# Patient Record
Sex: Female | Born: 1995
Health system: Southern US, Community
[De-identification: ages and names within clinical notes are randomized; demographics above are authoritative.]

## PROBLEM LIST (undated history)

## (undated) DIAGNOSIS — M419 Scoliosis, unspecified: Secondary | ICD-10-CM

## (undated) DIAGNOSIS — G43109 Migraine with aura, not intractable, without status migrainosus: Secondary | ICD-10-CM

## (undated) DIAGNOSIS — O24419 Gestational diabetes mellitus in pregnancy, unspecified control: Secondary | ICD-10-CM

## (undated) DIAGNOSIS — B3731 Acute candidiasis of vulva and vagina: Secondary | ICD-10-CM

## (undated) DIAGNOSIS — O2686 Pruritic urticarial papules and plaques of pregnancy (PUPPP): Secondary | ICD-10-CM

## (undated) DIAGNOSIS — B373 Candidiasis of vulva and vagina: Secondary | ICD-10-CM

## (undated) HISTORY — DX: Gestational diabetes mellitus in pregnancy, unspecified control: O24.419

## (undated) HISTORY — PX: NO PAST SURGERIES: SHX2092

## (undated) HISTORY — DX: Migraine with aura, not intractable, without status migrainosus: G43.109

## (undated) HISTORY — DX: Pruritic urticarial papules and plaques of pregnancy (puppp): O26.86

## (undated) HISTORY — DX: Acute candidiasis of vulva and vagina: B37.31

## (undated) HISTORY — DX: Candidiasis of vulva and vagina: B37.3

---
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
## 2015-03-17 ENCOUNTER — Emergency Department (HOSPITAL_COMMUNITY)
Admission: EM | Admit: 2015-03-17 | Discharge: 2015-03-17 | Discharge status: Absent without leave | Payer: Medicaid Other | Source: Home / Self Care

## 2015-03-18 ENCOUNTER — Encounter (HOSPITAL_COMMUNITY): Payer: Self-pay | Admitting: Emergency Medicine

## 2015-03-18 ENCOUNTER — Emergency Department (HOSPITAL_COMMUNITY)
Admission: EM | Admit: 2015-03-18 | Discharge: 2015-03-18 | Disposition: A | Discharge status: Home / Self Care | Payer: Medicaid Other | Attending: Emergency Medicine | Admitting: Emergency Medicine

## 2015-03-18 DIAGNOSIS — M545 Low back pain: Secondary | ICD-10-CM | POA: Insufficient documentation

## 2015-03-18 DIAGNOSIS — N898 Other specified noninflammatory disorders of vagina: Secondary | ICD-10-CM

## 2015-03-18 DIAGNOSIS — J069 Acute upper respiratory infection, unspecified: Secondary | ICD-10-CM

## 2015-03-18 DIAGNOSIS — M419 Scoliosis, unspecified: Secondary | ICD-10-CM | POA: Insufficient documentation

## 2015-03-18 DIAGNOSIS — Z3202 Encounter for pregnancy test, result negative: Secondary | ICD-10-CM | POA: Insufficient documentation

## 2015-03-18 HISTORY — DX: Scoliosis, unspecified: M41.9

## 2015-03-18 LAB — WET PREP, GENITAL
Clue Cells Wet Prep HPF POC: NONE SEEN
Sperm: NONE SEEN
Trich, Wet Prep: NONE SEEN
YEAST WET PREP: NONE SEEN

## 2015-03-18 LAB — I-STAT BETA HCG BLOOD, ED (MC, WL, AP ONLY): I-stat hCG, quantitative: <5 m[IU]/mL (ref <5)

## 2015-03-18 MED ORDER — ONDANSETRON 4 MG PO TBDP
4.0000 mg | ORAL_TABLET | Freq: Once | ORAL | Status: AC
  Administered 2015-03-18: 4 mg via ORAL
  Filled 2015-03-18: qty 1

## 2015-03-18 MED ORDER — REPHRESH VA GEL: 1.0000 | Freq: Every day | VAGINAL | Status: DC | PRN

## 2015-03-18 MED ORDER — ONDANSETRON HCL 4 MG PO TABS
4.0000 mg | ORAL_TABLET | Freq: Three times a day (TID) | ORAL | Status: DC | PRN
Start: 2015-03-18 — End: 2015-12-28

## 2015-03-18 MED ORDER — IBUPROFEN 600 MG PO TABS: 600.0000 mg | ORAL_TABLET | Freq: Three times a day (TID) | ORAL | Status: DC | PRN

## 2015-03-18 MED ORDER — IBUPROFEN 200 MG PO TABS
600.0000 mg | ORAL_TABLET | Freq: Once | ORAL | Status: AC
  Administered 2015-03-18: 600 mg via ORAL
  Filled 2015-03-18: qty 3

## 2015-03-18 NOTE — ED Provider Notes (Signed)
CSN: [id number]     Arrival date & time 03/18/15  1548 History   First MD Initiated Contact with Patient 03/18/15 1600     Chief Complaint  Patient presents with  . Sore Throat  . Cough  . Back Pain  . Vaginal Discharge     (Consider location/radiation/quality/duration/timing/severity/associated sxs/prior Treatment) The history is provided by the patient.     Patient with hx scoliosis presents with two days of sore throat, cough productive of yellow sputum, headache, nausea.  Also with low back pain that feels like her scoliosis.  Has had sick contact with niece last week who had sinusitis.  Also C/O many months of abnormal malodorous vaginal discharge.  LMP Nov 20.  Denies fevers, chills, myalgias, urinary symptoms, abdominal pain, bowel changes, weakness or numbness of the legs.   Was seen in urgent care yesterday and diagnosed with flu like symptoms, came today because she is concerned about the symptoms because she hasn't gotten a flu shot in several years.  Took nyquil x 1 dose two nights ago.    Past Medical History  Diagnosis Date  . Scoliosis    History reviewed. No pertinent past surgical history. History reviewed. No pertinent family history. Social History  Substance Use Topics  . Smoking status: Never Smoker   . Smokeless tobacco: None  . Alcohol Use: No   OB History    No data available     Review of Systems  All other systems reviewed and are negative.     Allergies  Review of patient's allergies indicates no known allergies.  Home Medications   Prior to Admission medications   Medication Sig Start Date End Date Taking? Authorizing Provider  Pseudoeph-Doxylamine-DM-APAP (NYQUIL PO) Take 30 mLs by mouth daily as needed (cold symptoms).   Yes Historical Provider, MD  rizatriptan (MAXALT-MLT) 5 MG disintegrating tablet Take 1 tablet (5 mg total) by mouth as needed for migraine. May repeat in 2 hours if needed Patient not taking: Reported on 03/18/2015  01/06/13   Reuben Likes, MD  triamcinolone cream (KENALOG) 0.1 % Apply 1 application topically 2 (two) times daily as needed. For itching 09/02/14   Charm Rings, MD   BP 135/91 mmHg  Pulse 79  Temp(Src) 98.6 F (37 C) (Oral)  Resp 20  SpO2 97% Physical Exam  Constitutional: She appears well-developed and well-nourished. No distress.  HENT:  Head: Normocephalic and atraumatic.  Mouth/Throat: Oropharynx is clear and moist. No oropharyngeal exudate.  Eyes: Conjunctivae are normal.  Neck: Neck supple.  Cardiovascular: Normal rate and regular rhythm.   Pulmonary/Chest: Effort normal and breath sounds normal. No respiratory distress. She has no wheezes. She has no rales.  Abdominal: Soft. She exhibits no distension. There is no tenderness. There is no rebound and no guarding.  Genitourinary: Uterus is not tender. Cervix exhibits no motion tenderness and no discharge. Right adnexum displays no mass, no tenderness and no fullness. Left adnexum displays no mass, no tenderness and no fullness. No erythema, tenderness or bleeding in the vagina. No foreign body around the vagina. No signs of injury around the vagina.  Very small amount of clear and white mucous in vagina.  Neurological: She is alert.  Skin: She is not diaphoretic.  Nursing note and vitals reviewed.   ED Course  Procedures (including critical care time) Labs Review Labs Reviewed  WET PREP, GENITAL - Abnormal; Notable for the following:    WBC, Wet Prep HPF POC MANY (*)  All other components within normal limits  HIV ANTIBODY (ROUTINE TESTING)  I-STAT BETA HCG BLOOD, ED ([facility], [facility], [facility] ONLY)  GC/CHLAMYDIA PROBE AMP ([place]) NOT AT [facility]    Imaging Review No results found. I have personally reviewed and evaluated these images and lab results as part of my medical decision-making.   EKG Interpretation None       Pt felt much better after ibuprofen and zofran. States she had never tried ibuprofen before.   MDM    Final diagnoses:  URI (upper respiratory infection)  Vaginal discharge    Afebrile, nontoxic patient with constellation of symptoms suggestive of viral syndrome.  No concerning findings on exam. Also c/o vaginal discharge.  Pelvic exam unremarkable. Wet prep many WBC.  Discharged home with supportive care, PCP follow up.  Resources.  Discussed result, findings, treatment, and follow up  with patient.  Pt given return precautions.  Pt verbalizes understanding and agrees with plan.        Trixie Dredgemily Tamanna Whitson, PA-C 03/18/15 1919  Leta BaptistEmily Roe Nguyen, MD 03/19/15 1038

## 2015-03-18 NOTE — Discharge Instructions (Signed)
Read the information below.  Use the prescribed medication as directed.  Please discuss all new medications with your pharmacist.  You may return to the Emergency Department at any time for worsening condition or any new symptoms that concern you.    If you develop high fevers that do not resolve with tylenol or ibuprofen, you have difficulty swallowing or breathing, or you are unable to tolerate fluids by mouth, return to the ER for a recheck.      Nhi?m trng ???ng h h?p trn, Ng??i l?n (Upper Respiratory Infection, Adult) H?u h?t nhi?m trng ???ng h h?p trn (URI) l b?nh nhi?m do vi rt ? ???ng d?n kh ??n ph?i. URI ?nh h??ng ??n m?i, h?ng v ???ng d?n kh trn. Lo?i URI ph? bi?n nh?t l vim m?i h?ng v th??ng ???c cho l "c?m l?nh thng th??ng". URI ti?n tri?n v th??ng s? t? kh?i. Thng th??ng URI khng c?n ?i?u tr?, nh?ng ?i khi ti?p theo nhi?m vi rt l nhi?m khu?n ???ng h h?p trn. Tnh tr?ng ny g?i l nhi?m trng th? pht. Nhi?m trng xoang v tai gi?a l nh?ng lo?i nhi?m trng h h?p trn th? pht ph? bi?n nh?t. Vim ph?i do vi khu?n c?ng c th? lm cho URI tr?m tr?ng h?n. URI c th? lm cho b?nh hen suy?n v b?nh ph?i t?c ngh?n m?n tnh (COPD) tr?m tr?ng h?n. ?i khi, nh?ng bi?n ch?ng ny c th? c?n ph?i ???c ?i?u tr? c?p c?u n?i khoa v c th? ?e d?a tnh m?ng.  NGUYN NHN H?u h?t t?t c? cc tr??ng h?p URI ??u do vi rt gy ra. Vi rt l m?t lo?i m?m b?nh v c th? ly lan t? ng??i ny sang ng??i khc.  CC Y?U T? NGUY C? Qu v? c th? c nguy c? b? URI n?u:   Qu v? ht thu?c l.  Qu v? b? b?nh tim ho?c b?nh ph?i m?n tnh.  Qu v? c h? th?ng phng v? (mi?n d?ch) suy y?u.  Qu v? r?t tr? ho?c r?t gi.  Qu v? b? d? ?ng ? m?i ho?c b? hen suy?n.  Qu v? lm vi?c trong cc khu v?c ?ng ?c ho?c thng kh km.  Qu v? lm vi?c trong cc trung tm ch?m [place] s?c kh?e ho?c tr??ng h?c. D?U HI?U V TRI?U CH?NG  Cc tri?u ch?ng th??ng pht tri?n sau khi qu v? ti?p xc v?i vi  rt c?m l?nh 2-3 ngy. H?u h?t cc tr??ng h?p URI do vi rt ko di 7-10 ngy. Tuy nhin, cc tr??ng h?p URI do vi rt cm (vi rt cm) c th? ko di 14 - 18 ngy v th??ng n?ng h?n. Tri?u ch?ng c th? bao g?m:   Ch?y n??c m?i ho?c ng?t (ngh?t) m?i.  H?t h?i.  Ho.  ?au h?ng.  ?au ??u.  M?t m?i.  S?t.  ?n khng ngon mi?ng.  ?au ? trn, pha sau m?t v trn x??ng g m (?au xoang).  ?au nh?c c?. CH?N ?ON  Chuyn gia ch?m  s?c kh?e c th? ch?n ?on URI b?ng cch:  Khm th?c th?.  Ki?m tra ?? kh?ng ??nh cc tri?u ch?ng khng ph?i l do m?t tnh tr?ng khc, ch?ng h?n nh?:  Vim h?ng do lin c?u khu?n.  Vim xoang.  Vim ph?i.  Hen suy?n. ?I?U TR?  URI t? kh?i sau m?t th?i gian. B?nh khng th? ch?a kh?i ???c b?ng thu?c, nh?ng thu?c c th? ???c k ??n v khuy?n ngh? dng ?? lm gi?m cc  tri?u ch?ng. Thu?c c th? gip:  H? s?t.  Gi?m ho.  Gi?m ngh?t m?i. H??NG D?N CH?M [place] T?I NH   Ch? s? d?ng thu?c theo ch? d?n c?a chuyn gia ch?m [place] s?c kh?e.  Xc mi?ng b?ng n??c mu?i ?m ho?c dng thu?c gi?m ho ?? lm d?u h?ng qu v? theo ch? d?n c?a chuyn gia ch?m [place] s?c kh?e.  S? d?ng my t?o ?? ?m b?ng s??ng m ?m ho?c ht h?i n??c t? m?t vi sen ?? t?ng ?? ?m khng kh. ?i?u ny c th? gip d? th? h?n.  U?ng ?? n??c ?? gi? cho n??c ti?u trong ho?c c mu vng nh?t.  ?n sp ho?c cc lo?i n??c canh trong khc v duy tr ch? ?? dinh d??ng t?t.  Ngh? ng?i khi c?n.  Tr? l?i lm vi?c khi nhi?t ?? c?a qu v? ? tr? l?i bnh th??ng ho?c theo chuyn gia ch?m [place] s?c kh?e h??ng d?n. Qu v? c th? c?n ? nh lu h?n ?? trnh ly nhi?m cho ng??i khc. Qu v? c?ng c th? s? d?ng m?t n? v r?a tay c?n th?n ?? ng?n ng?a ly lan vi rt.  T?ng c??ng s? d?ng ?ng thu?c ht n?u qu v? b? b?nh hen suy?n.  Khng s? d?ng b?t c? cc s?n ph?m thu?c l no, bao g?m thu?c l d?ng ht, thu?c l d?ng nhai ho?c thu?c l ?i?n t?. N?u qu v? c?n gip ?? ?? cai thu?c, hy h?i chuyn gia ch?m [place] s?c  kh?e. PHNG NG?A  Cch t?t nh?t ?? b?o v? b?n thn kh?i b? c?m l?nh l th?c hnh v? sinh t?t.   Hessie Diener ti?p xc b?ng mi?ng ho?c b?ng tay v?i nh?ng ng??i c tri?u ch?ng c?m l?nh.  R?a tay th??ng xuyn n?u c ti?p xc. Khng c b?ng ch?ng r rng v? vi?c vitamin C, vitamin E, echinacea ho?c t?p th? d?c lm gi?m kh? n?ng b? c?m l?nh. Tuy nhin, qu v? lun c?n ngh? ng?i nhi?u, t?p th? d?c v c ch? ?? dinh d??ng t?t.  ?I KHM N?U:   Qu v? b? tr?m tr?ng h?n ch? khng ?? h?n.  Tri?u ch?ng c?a qu v? khng ki?m sot ???c b?ng thu?c.  Qu v? b? ?n l?nh.  Qu v? b? kh th? h?n.  Qu v? c ??m mu nu ho?c ??Sander Nephew v? ti?t d?ch mu vng ho?c mu nu ? m?i.  Qu v? b? ?au ? m?t, ??c bi?t l khi qu v? ci v? pha tr??c.  Qu v? b? s?t.  Qu v? b? s?ng h?ch c?.  Qu v? th?y ?au khi nu?t.  Qu v? c nh?ng vng mu tr?ng ? thnh sau h?ng. NGAY L?P T?C ?I KHM N?U:   Qu v? b? n?ng v lin t?c:  ?au ??u.  ?au tai.  ?au xoang.  ?au ng?c.  Qu v? b? b?nh ph?i m?n tnh v b?t k? tnh tr?ng no sau ?y:  Th? kh kh.  Ho ko di.  Ho ra mu.  Thay ??i s? l??ng v mu s?c ??m thng th??ng c?a qu v?.  Qu v? b? c?ng c?.  Qu v? c nh?ng thay ??i v?:  Th? l?c.  Thnh l?c.  Suy ngh?.  Tm tnh. ??M B?O QU V?:   Hi?u r cc h??ng d?n ny.  S? theo di tnh tr?ng c?a mnh.  S? yu c?u tr? gip ngay l?p t?c n?u qu v? c?m th?y khng kh?e ho?c th?y tr?m tr?ng h?n.   Thng  tin ny khng nh?m m?c ?ch thay th? cho l?i khuyn m chuyn gia ch?m [place] s?c kh?e ni v?i qu v?. Hy b?o ??m qu v? ph?i th?o lu?n b?t k? v?n ?? g m qu v? c v?i chuyn gia ch?m [place] s?c kh?e c?a qu v?.   Document Released: 10/16/2010 Document Revised: 08/17/2014 Elsevier Interactive Patient Education [employer].

## 2015-03-18 NOTE — ED Notes (Signed)
Patient states she has had cough, sore throat and low back pain for several days.  Patient also c/o white pruritic malodorous vaginal discharge x3 days.  Patient denies N/V/D and fever.  Patient denies urinary s/s such as dysuria, hematuria and urinary frequency/urgency.

## 2015-03-18 NOTE — Progress Notes (Addendum)
Pt informs CM she does not have a pcp and has family planning medicaid  Self pay is indicated by registration  CM spoke with pt who [place].  CM discussed and provided written information for uninsured accepting pcps, discussed the importance of pcp vs EDP services for f/u care, www.needymeds.org, www.goodrx.com, discounted pharmacies and other [place] resources such as [facility] , [facility], affordable care act, financial assistance, uninsured dental services, [place] med assist, DSS and  health department  Reviewed resources for [place] uninsured accepting pcps like [facility], [facility] medicine [facility] [address], [facility], [facility], local urgent care centers, [facility], [facility], general medical clinics, [facility] the [place], [facility] care plus others, medication resources, [facility] out patient pharmacies and housing Pt voiced understanding and appreciation of resources provided   Provided [facility] contact information Pt agreed to a referral Cm completed referral Pt to be contact by [facility] clinical liason  Provided a list of medicaid [place] providers and a [employer] fact sheet and brochure of what family planning medicaid covers and does not cover Not covered Abortions  Ambulance  Condoms  Contraceptive foam, jellies or suppositories  Dental  Durable medical equipment  Emergency department  Emergency room  Fertility testing and treatment  Home health  Hysterectomies Inpatient hospital  Mental health  Optical  Pregnancy health care (beneficiaries should seek referral to the Medicaid for Pregnant Women Program for pregnancy health care)  Sick visits  Treatment for AIDS  Treatment for cancer  Any service not related to family planning

## 2015-03-18 NOTE — Progress Notes (Signed)
Pharmacy tech visiting pt when CM went to visit

## 2015-03-18 NOTE — ED Notes (Addendum)
Pt reports a sore throat that began a few days ago that progressed into a productive cough, HA and nausea (no emesis). Pt went to UC yesterday and was diagnosed with flu-like symptoms, but wants to be seen again. Pt also reports her lower back hurts, hx of scoliosis, thinks it is getting worse.

## 2015-03-18 NOTE — Progress Notes (Signed)
Entered in D/c instructions Medicaid family planning coverage & List of [place] Medicaid doctors  [facility] [phone] [place] [facility]: [phone] [address] [place], [place] [id number] [email or web address] This website describes your coverage+ other resources  Please review the educational information -brochure, fact sheet for what is covered for family planning how to inquire about other services that may be needed call your case worker at DSS  There are drs that may be able to assist @[place]  [facility] Please use the resources provided to you in emergency room by case manager to assist with doctor for follow up  Call on 03/21/2015 A referral for you has been sent to [facility] if you have not received a call in 3 days you may contact them Call Scherry RanKaren Andrianos at [phone] Tuesday-Friday [email or web address]  These [place] uninsured resources provide possible primary care providers, resources for discounted medications, housing, dental resources, affordable care act information, plus other resources for [employer] [place] medicaid beneficaries coverage  As a Medicaid client you MUST contact DSS/SSI each time you change address, move to another [place] or another state to keep your address updated

## 2015-03-19 LAB — HIV ANTIBODY (ROUTINE TESTING W REFLEX): HIV Screen 4th Generation wRfx: NONREACTIVE

## 2015-03-21 LAB — GC/CHLAMYDIA PROBE AMP (~~LOC~~) NOT AT ARMC
CHLAMYDIA, DNA PROBE: NEGATIVE
NEISSERIA GONORRHEA: NEGATIVE

## 2015-05-18 ENCOUNTER — Emergency Department (HOSPITAL_COMMUNITY)
Admission: EM | Admit: 2015-05-18 | Discharge: 2015-05-18 | Disposition: A | Discharge status: Home / Self Care | Payer: BLUE CROSS/BLUE SHIELD | Source: Home / Self Care | Attending: Family Medicine | Admitting: Family Medicine

## 2015-05-18 ENCOUNTER — Encounter (HOSPITAL_COMMUNITY): Payer: Self-pay

## 2015-05-18 DIAGNOSIS — B349 Viral infection, unspecified: Secondary | ICD-10-CM

## 2015-05-18 LAB — POCT URINALYSIS DIP (DEVICE)
Bilirubin Urine: NEGATIVE
Glucose, UA: NEGATIVE mg/dL
HGB URINE DIPSTICK: NEGATIVE
KETONES UR: NEGATIVE mg/dL
Leukocytes, UA: NEGATIVE
Nitrite: NEGATIVE
PROTEIN: NEGATIVE mg/dL
UROBILINOGEN UA: 1 mg/dL (ref 0.0–1.0)
pH: 6.5 (ref 5.0–8.0)

## 2015-05-18 NOTE — ED Notes (Signed)
Patient complains of cough congestion chills body aches Patient states her symptoms started last night

## 2015-05-18 NOTE — Discharge Instructions (Signed)
Nhi?m Trng Do Vi-Rt (Viral Infections) Nhi?m trng do vi rt c th? gy ra b?i cc lo?i vi rt khc nhau. H?u h?t nhi?m trng vi rt khng nghim tr?ng v t? kh?i. Tuy nhin, m?t s? nhi?m trng c th? gy ra cc tri?u ch?ng nghim tr?ng v c th? d?n ??n cc bi?n ch?ng khc. TRI?U CH?NG Vi rt c th? th??ng xuyn gy ra:  ?au h?ng nh?.  ?au nh?c.  ?au ??u.  S? m?i.  Cc lo?i pht ban khc nhau.  Ch?y n??c m?t.  M?t m?i.  Ho.  ?n khng ngon mi?ng.  Nhi?m trng ???ng tiu ha gy bu?n nn, nn m?a v tiu ch?y. Nh?ng tri?u ch?ng ny khng ph?n ?ng v?i thu?c khng sinh, v nhi?m trng khng gy ra b?i vi khu?n. Tuy nhin, b?n c th? b? nhi?m trng do vi khu?n sau khi nhi?m vi rt. ?y ?i khi ???c g?i l "b?i nhi?m". Cc tri?u ch?ng c?a nhi?m trng do vi khu?n nh? v?y c th? bao g?m:  ?au h?ng c m? v kh nu?t ngy cng t?i t?.  S?ng cc h?ch ? c?.  ?n l?nh v s?t cao ho?c ko di.  ?au ??u d? d?i.  C?m gic ?au ? vng xoang.  C?m gic b? m?t ton thn (kh ch?u) ko di, ?au nh?c c? b?p v m?t m?i.  Lin t?c ho.  Ho ra b?t k? ??m mu vng, xanh ho?c nu. H??NG D?N CH?M SC T?I NH  Ch? s? d?ng thu?c khng c?n k toa ho?c thu?c c?n k toa ?? gi?m ?au, gi?m c?m gic kh ch?u, tiu ch?y ho?c h? s?t theo ch? d?n c?a chuyn gia ch?m sc s?c kh?e.  U?ng ?? n??c v dung d?ch ?? n??c ti?u trong ho?c c mu vng nh?t. ?? u?ng th? thao c th? cung c?p ch?t ?i?n gi?i, ???ng v ?? n??c c gi tr?.  Ngh? ng?i nhi?u v duy tr ch? ?? dinh d??ng thch h?p. C th? dng sp v n??c canh v?i bnh quy gin ho?c g?o. HY NGAY L?P T?C ?I KHM N?U:  B?n b? nh?c ??u, kh th?, ?au ng?c, ?au c? n?ng ho?c pht ban khc th??ng.  B?n b? nn, tiu ch?y khng ki?m sot ???c, ho?c b?n khng th? gi? l?i ch?t l?ng.  Nhi?t ?? ?o ? mi?ng trn 38,9 C (102 F), khng gi?m sau khi dng thu?c.  Tr? h?n 3 thng tu?i c nhi?t ?? ?o ? tr?c trng l 102 F (38,9 C) ho?c cao h?n.  Tr? 3 thng tu?i  ho?c nh? h?n c nhi?t ?? ?o ? tr?c trng l 100,4 F (38 C) ho?c cao h?n. HY CH?C CH?N R?NG B?N:  Hi?u r nh?ng h??ng d?n khi xu?t vi?n.  S? theo di tnh tr?ng b?nh c?a b?n.  S? yu c?u tr? gip ngay l?p t?c n?u b?n khng ?? ho?c tnh tr?ng tr?m tr?ng h?n.   Thng tin ny khng nh?m m?c ?ch thay th? cho l?i khuyn m chuyn gia ch?m sc s?c kh?e ni v?i qu v?. Hy b?o ??m qu v? ph?i th?o lu?n b?t k? v?n ?? g m qu v? c v?i chuyn gia ch?m sc s?c kh?e c?a qu v?.   Document Released: 04/02/2005 Document Revised: 12/03/2012 Elsevier Interactive Patient Education 2016 Elsevier Inc.  

## 2015-05-18 NOTE — ED Provider Notes (Signed)
CSN: [id number]     Arrival date & time 05/18/15  1750 History   First MD Initiated Contact with Patient 05/18/15 1929     Chief Complaint  Patient presents with  . Generalized Body Aches   (Consider location/radiation/quality/duration/timing/severity/associated sxs/prior Treatment) HPI History obtained from patient:   LOCATION: Upper resp SEVERITY: 2 DURATION:a couple of days CONTEXT:sudden onset QUALITY: MODIFYING FACTORS:OTC meds ASSOCIATED SYMPTOMS:body aches, chills TIMING:constant OCCUPATION:student  Past Medical History  Diagnosis Date  . Scoliosis    History reviewed. No pertinent past surgical history. No family history on file. Social History  Substance Use Topics  . Smoking status: Never Smoker   . Smokeless tobacco: None  . Alcohol Use: No   OB History    No data available     Review of Systems ROS +'veback pain, uri symptoms  Denies: HEADACHE, NAUSEA, ABDOMINAL PAIN, CHEST PAIN, CONGESTION, DYSURIA, SHORTNESS OF BREATH  Allergies  Review of patient's allergies indicates no known allergies.  Home Medications   Prior to Admission medications   Medication Sig Start Date End Date Taking? Authorizing Provider  ibuprofen (ADVIL,MOTRIN) 600 MG tablet Take 1 tablet (600 mg total) by mouth every 8 (eight) hours as needed for mild pain or moderate pain. 03/18/15   Trixie Dredge, PA-C  ondansetron (ZOFRAN) 4 MG tablet Take 1 tablet (4 mg total) by mouth every 8 (eight) hours as needed for nausea or vomiting. 03/18/15   Trixie Dredge, PA-C  Pseudoeph-Doxylamine-DM-APAP (NYQUIL PO) Take 30 mLs by mouth daily as needed (cold symptoms).    Historical Provider, MD  rizatriptan (MAXALT-MLT) 5 MG disintegrating tablet Take 1 tablet (5 mg total) by mouth as needed for migraine. May repeat in 2 hours if needed Patient not taking: Reported on 03/18/2015 01/06/13   Reuben Likes, MD  triamcinolone cream (KENALOG) 0.1 % Apply 1 application topically 2 (two) times daily as needed.  For itching 09/02/14   Charm Rings, MD  Vaginal Lubricant (REPHRESH) GEL Place 1 Applicatorful vaginally daily as needed (vaginal discharge). 03/18/15   Trixie Dredge, PA-C   Meds Ordered and Administered this Visit  Medications - No data to display  BP 111/76 mmHg  Pulse 98  Temp(Src) 99.5 F (37.5 C) (Oral)  Resp 16  SpO2 98% No data found.   Physical Exam  Constitutional: She is oriented to person, place, and time. She appears well-developed and well-nourished.  HENT:  Head: Normocephalic and atraumatic.  Right Ear: External ear normal.  Left Ear: External ear normal.  Nose: Nose normal.  Mouth/Throat: Oropharynx is clear and moist.  Eyes: Conjunctivae are normal.  Neck: Normal range of motion. Neck supple.  Cardiovascular: Normal rate.   Pulmonary/Chest: Effort normal and breath sounds normal.  Abdominal: Soft.  Musculoskeletal: Normal range of motion.  Neurological: She is alert and oriented to person, place, and time.  Skin: Skin is warm and dry.  Psychiatric: She has a normal mood and affect. Her behavior is normal.  Nursing note and vitals reviewed.   ED Course  Procedures (including critical care time)  Labs Review Labs Reviewed  POCT URINALYSIS DIP (DEVICE)    Imaging Review No results found.   Visual Acuity Review  Right Eye Distance:   Left Eye Distance:   Bilateral Distance:    Right Eye Near:   Left Eye Near:    Bilateral Near:         MDM   1. Viral illness    Patient is advised to continue home symptomatic  treatment.  Patient is advised that if there are new or worsening symptoms or attend the emergency department, or contact primary care provider. Instructions of care provided discharged home in stable condition. Return to work/school note provided.  THIS NOTE WAS GENERATED USING A VOICE RECOGNITION SOFTWARE PROGRAM. ALL REASONABLE EFFORTS  WERE MADE TO PROOFREAD THIS DOCUMENT FOR ACCURACY.     Tharon Aquas, PA 05/18/15  2019

## 2015-12-28 ENCOUNTER — Encounter: Payer: Self-pay | Admitting: Family

## 2015-12-28 ENCOUNTER — Ambulatory Visit (INDEPENDENT_AMBULATORY_CARE_PROVIDER_SITE_OTHER): Payer: BLUE CROSS/BLUE SHIELD | Admitting: Family

## 2015-12-28 VITALS — BP 137/88 | HR 87 | Ht 61.81 in | Wt 108.8 lb

## 2015-12-28 DIAGNOSIS — Z3202 Encounter for pregnancy test, result negative: Secondary | ICD-10-CM

## 2015-12-28 DIAGNOSIS — Z30011 Encounter for initial prescription of contraceptive pills: Secondary | ICD-10-CM | POA: Diagnosis not present

## 2015-12-28 DIAGNOSIS — Z113 Encounter for screening for infections with a predominantly sexual mode of transmission: Secondary | ICD-10-CM

## 2015-12-28 DIAGNOSIS — G43109 Migraine with aura, not intractable, without status migrainosus: Secondary | ICD-10-CM | POA: Diagnosis not present

## 2015-12-28 DIAGNOSIS — N898 Other specified noninflammatory disorders of vagina: Secondary | ICD-10-CM

## 2015-12-28 LAB — POCT URINE PREGNANCY: PREG TEST UR: NEGATIVE

## 2015-12-28 LAB — POCT RAPID HIV: RAPID HIV, POC: NEGATIVE

## 2015-12-28 MED ORDER — NORETHINDRONE 0.35 MG PO TABS: 1.0000 | ORAL_TABLET | Freq: Every day | ORAL | 4 refills | Status: DC

## 2015-12-28 NOTE — Progress Notes (Signed)
THIS RECORD MAY CONTAIN CONFIDENTIAL INFORMATION THAT SHOULD NOT BE RELEASED WITHOUT REVIEW OF THE SERVICE PROVIDER.  Adolescent Medicine New Consult Visit:  Terri Combs  is a 20 y.o. female with a history of migraine with aura who is here for contraception management.   Clinical Staff Visit Tasks:   - Urine GC/CT due? yes - HIV Screening due?  yes   Growth Chart Viewed? yes   History was provided by the patient.  PCP Confirmed?  No - says she does not have one  My Chart Activated?   no  Patient's personal or confidential phone number:  [phone] Enter confidential phone number in Family Comments section of SnapShot  CC: discuss reproductive health/contraception management   HPI:  Wants to discuss birth control. She says she is interested in pills. Dad wants her to be on a pill for now. She says that her dad is "old fashioned" and prefers that she is on pills and not using another method until she is 20 years old. She has been on OCPs in the past (started last June) and stopped in February 2017. She stopped in February because "she was no longer sexually active." She is now sexually active again. Last sexually active in June 2017. She did not use protection. She is sexually active with men only. She participates in vaginal and oral intercourse. She has had a total of 9 lifetime partners. She denies any history of STIs. She first started menses at age 70. Periods are usually regular and come every month and last 3-5 days. LMP started on 8/30. Denies current dysuria and hematuria. She has been having increased vaginal discharge since February 2017 when she stopped her OCP. She says the discharge will increase and then decrease (comes and goes) and occasionally it is foul smelling. Denies pelvic pain, denies fevers, denies abdominal pain.   There is no family history of bleeding or clotting disorders. She has no history of clots. She does have a history of migraine with aura. She started having  these at age 28 and her last one was  4 months ago. Her aura is described as "seeing a squiggly line." She does have photophobia and nausea as well.    Patient's last menstrual period was 12/19/2015 (exact date). No Known Allergies Outpatient Medications Prior to Visit  Medication Sig Dispense Refill  . rizatriptan (MAXALT-MLT) 5 MG disintegrating tablet Take 1 tablet (5 mg total) by mouth as needed for migraine. May repeat in 2 hours if needed (Patient not taking: Reported on 12/28/2015) 10 tablet 0  . Vaginal Lubricant (REPHRESH) GEL Place 1 Applicatorful vaginally daily as needed (vaginal discharge). (Patient not taking: Reported on 12/28/2015) 2 g 0  . ibuprofen (ADVIL,MOTRIN) 600 MG tablet Take 1 tablet (600 mg total) by mouth every 8 (eight) hours as needed for mild pain or moderate pain. 15 tablet 0  . ondansetron (ZOFRAN) 4 MG tablet Take 1 tablet (4 mg total) by mouth every 8 (eight) hours as needed for nausea or vomiting. 10 tablet 0  . Pseudoeph-Doxylamine-DM-APAP (NYQUIL PO) Take 30 mLs by mouth daily as needed (cold symptoms).    . triamcinolone cream (KENALOG) 0.1 % Apply 1 application topically 2 (two) times daily as needed. For itching 30 g 0   No facility-administered medications prior to visit.      There are no active problems to display for this patient.   Social History: Lives with mom, dad School: She is not in school. She is working  at a [employer] Exercise: Goes to the gym  Sports: No Sleep:  no sleep issues  Confidentiality was discussed with the patient and if applicable, with caregiver as well.  Tobacco?  no Drugs/ETOH?  no Partner preference?  female Sexually Active?  yes  Pregnancy Prevention:  none - doesn't always use condoms, reviewed condoms & plan B    The following portions of the patient's history were reviewed and updated as appropriate: allergies, current medications, past family history, past medical history, past social history, past  surgical history and problem list.  Physical Exam:  Vitals:   12/28/15 1353  BP: 137/88  Pulse: 87  Weight: 108 lb 12.8 oz (49.4 kg)  Height: 5' 1.81" (1.57 m)   BP 137/88   Pulse 87   Ht 5' 1.81" (1.57 m)   Wt 108 lb 12.8 oz (49.4 kg)   LMP 12/19/2015 (Exact Date)   BMI 20.02 kg/m  Body mass index: body mass index is 20.02 kg/m. Growth percentile SmartLinks can only be used for patients less than 20 years old.  Physical Exam  General: Female sitting on exam table, alert, interactive, well-appearing HEENT: PERRLA, EOMI, nares clear, oropharynx clear Neck: Supple, no LAD CV: RRR, normal S1/S2, no murmurs, 2+ distal pulses bilaterally Resp: CTA bilaterally, no wheezes, no crackles Abdomen: +BS, soft, NTND, no organomegaly  Skin: No rashes or lesions,, tatoos on arms bilaterally  Neuro: Alert, interactive, CN II-XII grossly intact    Assessment/Plan:  1. Routine screening for STI (sexually transmitted infection) - POCT Rapid HIV - GC/Chlamydia Probe Amp - Will call patient directly if results positive and if she needs treatment   2. Pregnancy examination or test, negative result - Sexually active (last in June), but no protection used  - POCT urine pregnancy  3. Vaginal discharge - Having vaginal discharge off and on since February 2017. Will send wet prep and GC Chlamydia as above - WET PREP BY MOLECULAR PROBE - Will call with results if positive - No pelvic pain or fevers - very low concern for PID. Discussed strict return precautions   4. Encounter for initial prescription of contraceptive pills - Counseled extensively on all options of birth control including OCPs, IUD, implant, condoms. She is not a candidate for OCPs with estrogen given her history of migraine with aura. Explained that she must take progesterone only pills at the exact same time every day. She is very interested in the nexplanon, but does not want it today. Provided a hand-out on nexplanon and she  will decide when to come back - Explained that she should always use condoms as well  - norethindrone (CAMILA) 0.35 MG tablet; Take 1 tablet (0.35 mg total) by mouth daily.  Dispense: 1 Package; Refill: 4  5. Migraine with aura and without status migrainosus, not intractable - Diagnosed at age 20, only uses ibuprofen  - Will not be able to use OCPs with estrogen due to this diagnosis  - Discussed strict return precautions or that we can send her to neurology for further evaluation if needed   Follow-up:  Return if symptoms worsen or fail to improve, for If she wants nexplanon placed .   Medical decision-making:  >30 minutes spent face to face with patient with more than 50% of appointment spent discussing diagnosis, management, follow-up, and reviewing the plan of care as noted above.

## 2015-12-28 NOTE — Patient Instructions (Addendum)
It was great seeing you today! We will call you if any of your tests are positive. We have sent a prescription for progesterone only birth control pills to the pharmacy. It is very important that you take it at the same time everyday.   Etonogestrel implant What is this medicine? ETONOGESTREL (et oh noe JES trel) is a contraceptive (birth control) device. It is used to prevent pregnancy. It can be used for up to 3 years. This medicine may be used for other purposes; ask your health care provider or pharmacist if you have questions. What should I tell my health care provider before I take this medicine? They need to know if you have any of these conditions: -abnormal vaginal bleeding -blood vessel disease or blood clots -cancer of the breast, cervix, or liver -depression -diabetes -gallbladder disease -headaches -heart disease or recent heart attack -high blood pressure -high cholesterol -kidney disease -liver disease -renal disease -seizures -tobacco smoker -an unusual or allergic reaction to etonogestrel, other hormones, anesthetics or antiseptics, medicines, foods, dyes, or preservatives -pregnant or trying to get pregnant -breast-feeding How should I use this medicine? This device is inserted just under the skin on the inner side of your upper arm by a health care professional. Talk to your pediatrician regarding the use of this medicine in children. Special care may be needed. Overdosage: If you think you have taken too much of this medicine contact a poison control center or emergency room at once. NOTE: This medicine is only for you. Do not share this medicine with others. What if I miss a dose? This does not apply. What may interact with this medicine? Do not take this medicine with any of the following medications: -amprenavir -bosentan -fosamprenavir This medicine may also interact with the following medications: -barbiturate medicines for inducing sleep or treating  seizures -certain medicines for fungal infections like ketoconazole and itraconazole -griseofulvin -medicines to treat seizures like carbamazepine, felbamate, oxcarbazepine, phenytoin, topiramate -modafinil -phenylbutazone -rifampin -some medicines to treat HIV infection like atazanavir, indinavir, lopinavir, nelfinavir, tipranavir, ritonavir -St. John's wort This list may not describe all possible interactions. Give your health care provider a list of all the medicines, herbs, non-prescription drugs, or dietary supplements you use. Also tell them if you smoke, drink alcohol, or use illegal drugs. Some items may interact with your medicine. What should I watch for while using this medicine? This product does not protect you against HIV infection (AIDS) or other sexually transmitted diseases. You should be able to feel the implant by pressing your fingertips over the skin where it was inserted. Contact your doctor if you cannot feel the implant, and use a non-hormonal birth control method (such as condoms) until your doctor confirms that the implant is in place. If you feel that the implant may have broken or become bent while in your arm, contact your healthcare provider. What side effects may I notice from receiving this medicine? Side effects that you should report to your doctor or health care professional as soon as possible: -allergic reactions like skin rash, itching or hives, swelling of the face, lips, or tongue -breast lumps -changes in emotions or moods -depressed mood -heavy or prolonged menstrual bleeding -pain, irritation, swelling, or bruising at the insertion site -scar at site of insertion -signs of infection at the insertion site such as fever, and skin redness, pain or discharge -signs of pregnancy -signs and symptoms of a blood clot such as breathing problems; changes in vision; chest pain; severe, sudden headache;  pain, swelling, warmth in the leg; trouble speaking; sudden  numbness or weakness of the face, arm or leg -signs and symptoms of liver injury like dark yellow or brown urine; general ill feeling or flu-like symptoms; light-colored stools; loss of appetite; nausea; right upper belly pain; unusually weak or tired; yellowing of the eyes or skin -unusual vaginal bleeding, discharge -signs and symptoms of a stroke like changes in vision; confusion; trouble speaking or understanding; severe headaches; sudden numbness or weakness of the face, arm or leg; trouble walking; dizziness; loss of balance or coordination Side effects that usually do not require medical attention (Report these to your doctor or health care professional if they continue or are bothersome.): -acne -back pain -breast pain -changes in weight -dizziness -general ill feeling or flu-like symptoms -headache -irregular menstrual bleeding -nausea -sore throat -vaginal irritation or inflammation This list may not describe all possible side effects. Call your doctor for medical advice about side effects. You may report side effects to FDA at 1-800-FDA-1088. Where should I keep my medicine? This drug is given in a hospital or clinic and will not be stored at home. NOTE: This sheet is a summary. It may not cover all possible information. If you have questions about this medicine, talk to your doctor, pharmacist, or health care provider.    2016, Elsevier/Gold Standard. (2014-01-15 14:07:06)

## 2015-12-29 ENCOUNTER — Other Ambulatory Visit: Payer: Self-pay | Admitting: Family

## 2015-12-29 LAB — GC/CHLAMYDIA PROBE AMP
CT Probe RNA: NOT DETECTED
GC Probe RNA: NOT DETECTED

## 2015-12-29 LAB — WET PREP BY MOLECULAR PROBE
Candida species: POSITIVE — AB
Gardnerella vaginalis: POSITIVE — AB
Trichomonas vaginosis: NEGATIVE

## 2015-12-29 MED ORDER — FLUCONAZOLE 150 MG PO TABS: 150.0000 mg | ORAL_TABLET | Freq: Every day | ORAL | 1 refills | Status: DC

## 2015-12-29 MED ORDER — METRONIDAZOLE 500 MG PO TABS: 500.0000 mg | ORAL_TABLET | Freq: Two times a day (BID) | ORAL | 0 refills | Status: AC

## 2015-12-29 NOTE — Progress Notes (Signed)
TC to pt. Updated of labs results and medications prescribed. Pt verbalized understanding.

## 2015-12-30 ENCOUNTER — Telehealth: Payer: Self-pay | Admitting: *Deleted

## 2015-12-30 NOTE — Telephone Encounter (Signed)
VM from pt, requesting callback re: new medication.   TC to pt. LVM. Advised to callback if she still has concerns. Clinic phone number provided.

## 2016-01-03 ENCOUNTER — Telehealth: Payer: Self-pay | Admitting: *Deleted

## 2016-01-03 NOTE — Telephone Encounter (Signed)
VM from pt. States that she accidentally threw away her packet of birth control pills, and is requesting a refill be sent to her [facility] pharmacy.

## 2016-01-05 ENCOUNTER — Other Ambulatory Visit: Payer: Self-pay | Admitting: Pediatrics

## 2016-01-05 ENCOUNTER — Encounter: Payer: Self-pay | Admitting: *Deleted

## 2016-01-05 DIAGNOSIS — Z30011 Encounter for initial prescription of contraceptive pills: Secondary | ICD-10-CM

## 2016-01-05 MED ORDER — NORETHINDRONE 0.35 MG PO TABS: 1.0000 | ORAL_TABLET | Freq: Every day | ORAL | 4 refills | Status: DC

## 2016-01-05 NOTE — Telephone Encounter (Signed)
Sent. Given she has likely missed pills very important to use back up protection. May have BTB since they are progesterone only.

## 2016-03-31 ENCOUNTER — Encounter: Payer: Self-pay | Admitting: Family

## 2016-05-03 ENCOUNTER — Telehealth: Payer: Self-pay | Admitting: *Deleted

## 2016-05-03 ENCOUNTER — Other Ambulatory Visit: Payer: Self-pay | Admitting: Family

## 2016-05-03 DIAGNOSIS — Z30011 Encounter for initial prescription of contraceptive pills: Secondary | ICD-10-CM

## 2016-05-03 MED ORDER — NORETHINDRONE 0.35 MG PO TABS: 1.0000 | ORAL_TABLET | Freq: Every day | ORAL | 4 refills | Status: DC

## 2016-05-03 NOTE — Telephone Encounter (Signed)
BC pills sent to pharmacy.

## 2016-05-03 NOTE — Telephone Encounter (Signed)
Patient called on 1/17, clinic was closed due to inclement weather. She  left message stating that she lost her BCP and last dose she took was on Tuesday 1/16.  Please advice.

## 2016-05-05 ENCOUNTER — Ambulatory Visit: Payer: BLUE CROSS/BLUE SHIELD

## 2016-05-05 ENCOUNTER — Ambulatory Visit (INDEPENDENT_AMBULATORY_CARE_PROVIDER_SITE_OTHER): Payer: BLUE CROSS/BLUE SHIELD | Admitting: Family Medicine

## 2016-05-05 VITALS — BP 110/78 | HR 69 | Temp 98.1°F | Resp 17 | Ht 62.0 in | Wt 107.0 lb

## 2016-05-05 DIAGNOSIS — Z0000 Encounter for general adult medical examination without abnormal findings: Secondary | ICD-10-CM | POA: Diagnosis not present

## 2016-05-05 DIAGNOSIS — Z13220 Encounter for screening for lipoid disorders: Secondary | ICD-10-CM

## 2016-05-05 DIAGNOSIS — A64 Unspecified sexually transmitted disease: Secondary | ICD-10-CM

## 2016-05-05 DIAGNOSIS — Z1329 Encounter for screening for other suspected endocrine disorder: Secondary | ICD-10-CM

## 2016-05-05 DIAGNOSIS — Z3009 Encounter for other general counseling and advice on contraception: Secondary | ICD-10-CM

## 2016-05-05 LAB — POCT URINALYSIS DIP (MANUAL ENTRY)
BILIRUBIN UA: NEGATIVE
Bilirubin, UA: NEGATIVE
Blood, UA: NEGATIVE
GLUCOSE UA: NEGATIVE
LEUKOCYTES UA: NEGATIVE
NITRITE UA: NEGATIVE
Spec Grav, UA: 1.02
Urobilinogen, UA: 0.2
pH, UA: 7.5

## 2016-05-05 LAB — POCT WET + KOH PREP
Trich by wet prep: ABSENT
YEAST BY WET PREP: ABSENT
Yeast by KOH: ABSENT

## 2016-05-05 LAB — POC MICROSCOPIC URINALYSIS (UMFC)

## 2016-05-05 LAB — POCT URINE PREGNANCY: Preg Test, Ur: NEGATIVE

## 2016-05-05 MED ORDER — NORGESTIMATE-ETH ESTRADIOL 0.25-35 MG-MCG PO TABS: 1.0000 | ORAL_TABLET | Freq: Every day | ORAL | 11 refills | Status: DC

## 2016-05-05 NOTE — Patient Instructions (Addendum)
Start ortho-cyclen birth control pill pack today. Use condoms for the next three weeks to ensure the birth control pills have maximized effectiveness.  I will contact you regarding your lab results.  Nice meeting you!  Terri PickKimberly S. Tiburcio PeaHarris, MSN, FNP-C Primary Care at [facility] [phone]   IF you received an x-ray today, you will receive an invoice from [facility]. Please contact [facility] at [phone] with questions or concerns regarding your invoice.   IF you received labwork today, you will receive an invoice from [facility]. Please contact [facility] at [phone] with questions or concerns regarding your invoice.   Our billing staff will not be able to assist you with questions regarding bills from these companies.  You will be contacted with the lab results as soon as they are available. The fastest way to get your results is to activate your My Chart account. Instructions are located on the last page of this paperwork. If you have not heard from us regarding the results in 2 weeks, please contact this office.     Exercising to Stay Healthy Introduction Exercising regularly is important. It has many health benefits, such as:  Improving your overall fitness, flexibility, and endurance.  Increasing your bone density.  Helping with weight control.  Decreasing your body fat.  Increasing your muscle strength.  Reducing stress and tension.  Improving your overall health. In order to become healthy and stay healthy, it is recommended that you do moderate-intensity and vigorous-intensity exercise. You can tell that you are exercising at a moderate intensity if you have a higher heart rate and faster breathing, but you are still able to hold a conversation. You can tell that you are exercising at a vigorous intensity if you are breathing much harder and faster and cannot hold a conversation while exercising. How often should I  exercise? Choose an activity that you enjoy and set realistic goals. Your health care provider can help you to make an activity plan that works for you. Exercise regularly as directed by your health care provider. This may include:  Doing resistance training twice each week, such as:  Push-ups.  Sit-ups.  Lifting weights.  Using resistance bands.  Doing a given intensity of exercise for a given amount of time. Choose from these options:  150 minutes of moderate-intensity exercise every week.  75 minutes of vigorous-intensity exercise every week.  A mix of moderate-intensity and vigorous-intensity exercise every week. Children, pregnant women, people who are out of shape, people who are overweight, and older adults may need to consult a health care provider for individual recommendations. If you have any sort of medical condition, be sure to consult your health care provider before starting a new exercise program. What are some exercise ideas? Some moderate-intensity exercise ideas include:  Walking at a rate of 1 mile in 15 minutes.  Biking.  Hiking.  Golfing.  Dancing. Some vigorous-intensity exercise ideas include:  Walking at a rate of at least 4.5 miles per hour.  Jogging or running at a rate of 5 miles per hour.  Biking at a rate of at least 10 miles per hour.  Lap swimming.  Roller-skating or in-line skating.  Cross-country skiing.  Vigorous competitive sports, such as football, basketball, and soccer.  Jumping rope.  Aerobic dancing. What are some everyday activities that can help me to get exercise?  Yard work, such as:  Child psychotherapistushing a lawn mower.  Raking and bagging leaves.  Washing and waxing your car.  Pushing a stroller.  Shoveling snow.  Gardening.  Washing windows or floors. How can I be more active in my day-to-day activities?  Use the stairs instead of the elevator.  Take a walk during your lunch break.  If you drive, park your car  farther away from work or school.  If you take public transportation, get off one stop early and walk the rest of the way.  Make all of your phone calls while standing up and walking around.  Get up, stretch, and walk around every 30 minutes throughout the day. What guidelines should I follow while exercising?  Do not exercise so much that you hurt yourself, feel dizzy, or get very short of breath.  Consult your health care provider before starting a new exercise program.  Wear comfortable clothes and shoes with good support.  Drink plenty of water while you exercise to prevent dehydration or heat stroke. Body water is lost during exercise and must be replaced.  Work out until you breathe faster and your heart beats faster. This information is not intended to replace advice given to you by your health care provider. Make sure you discuss any questions you have with your health care provider. Document Released: 05/05/2010 Document Revised: 09/08/2015 Document Reviewed: 09/03/2013  2017 Elsevier Hormonal Contraception Information Introduction Estrogen and progesterone (progestin) are hormones used in many forms of birth control (contraception). These two hormones make up most hormonal contraceptives. Hormonal contraceptives use either:  A combination of estrogen hormone and progesterone hormone in one of these forms:  Pill. Pills come in various combinations of active hormone pills and nonhormonal pills. Different combinations of pills may give you a period once a month, once every 3 months, or no period at all. It is important to take the pills the same time each day.  Patch. The patch is placed on the lower abdomen every week for 3 weeks. On the fourth week, the patch is not placed.  Vaginal ring. The ring is placed in the vagina and left there for 3 weeks. It is then removed for 1 week.  Progesterone alone in one of these forms:  Pill. Hormone pills are taken every day of the  cycle.  Intrauterine device (IUD). The IUD is inserted during a menstrual period and removed or replaced every 5 years or sooner.  Implant. Plastic rods are placed under the skin of the upper arm. They are removed or replaced every 3 years or sooner.  Injection. The injection is given once every 90 days. Pregnancy can still occur with any of these hormonal contraceptive methods. If you have any suspicion that you might be pregnant, take a pregnancy test and talk to your health care provider. Estrogen and progesterone contraceptives Estrogen and progesterone contraceptives can prevent pregnancy by:  Stopping the release of an egg (ovulation).  Thickening the mucus of the cervix, making it difficult for sperm to enter the uterus.  Changing the lining of the uterus. This change makes it more difficult for an egg to implant. Progesterone contraceptives Progesterone-only contraceptives can prevent pregnancy by:  Blocking ovulation. This occurs in many women, but some women will continue to ovulate.  Preventing the entry of sperm into the uterus by keeping the cervical mucus thick and sticky.  Changing the lining of the uterus. This change makes it more difficult for an egg to implant. Side effects Talk to your health care provider about what side effects may affect you. If you develop persistent side effects or if the  effects are severe, talk to your health care provider.  Estrogen. Side effects from estrogen occur more often in the first 2-3 months. They include:  Progesterone. Side effects of progesterone can vary. They include: Questions to ask This information is not intended to replace advice given to you by your health care provider. Make sure you discuss any questions you have with your health care provider. Document Released: 04/22/2007 Document Revised: 01/04/2016 Document Reviewed: 09/14/2012  2017 Elsevier

## 2016-05-05 NOTE — Progress Notes (Signed)
Patient ID: Terri Combs, female    DOB: 06/19/1995, 21 y.o.   MRN: [id number]  PCP: No PCP Per Patient  Chief Complaint  Patient presents with  . Annual Exam    NP CPE & PAP    Subjective:  HPI 21 year old female presents for a complete physical exam. PMHX is unremarkable. Reports regular workouts 4-5 x weekly. Eats fast foot frequently.  Concerns today include the following:  Birth Control  Would like to change birth control medication. Reports more headaches since starting the medication. Last period three months ago as birth control contains all active doses. Open to taking a lower dose that allows for a monthly period. Sexually active with same partner. Reports no condom use.   Preventative Maintenance  Reports no recent dental visit.  Eye Doctor- last OV 2 years ago and feels her vision has worsened.   Social History   Social History  . Marital status: Single    Spouse name: N/A  . Number of children: N/A  . Years of education: N/A   Occupational History  . Not on file.   Social History Main Topics  . Smoking status: Never Smoker  . Smokeless tobacco: Never Used  . Alcohol use No  . Drug use: No  . Sexual activity: Yes    Partners: Male    Birth control/ protection: None   Other Topics Concern  . Not on file   Social History Narrative  . No narrative on file   Review of Systems See HPI  Prior to Admission medications   Medication Sig Start Date End Date Taking? Authorizing Provider  levonorgestrel-ethinyl estradiol (KURVELO) 0.15-30 MG-MCG tablet Take 1 tablet by mouth daily.   Yes Historical Provider, MD  rizatriptan (MAXALT-MLT) 5 MG disintegrating tablet Take 1 tablet (5 mg total) by mouth as needed for migraine. May repeat in 2 hours if needed Patient not taking: Reported on 12/28/2015 01/06/13   Reuben Likesavid C Keller, MD  Vaginal Lubricant [facility](RE[facility]) GEL Place 1 Applicatorful vaginally daily as needed (vaginal discharge). Patient not taking: Reported on  12/28/2015 03/18/15   Trixie DredgeEmily West, PA-C  Past Medical, Surgical Family and Social History reviewed and updated.   Objective:   Today's Vitals   05/05/16 1144  BP: 110/78  Pulse: 69  Resp: 17  Temp: 98.1 F (36.7 C)  TempSrc: Oral  SpO2: 99%  Weight: 107 lb (48.5 kg)  Height: 5\' 2"  (1.575 m)    Wt Readings from Last 3 Encounters:  05/05/16 107 lb (48.5 kg)  12/28/15 108 lb 12.8 oz (49.4 kg)   Physical Exam  Constitutional: She is oriented to person, place, and time. She appears well-developed and well-nourished.  HENT:  Head: Normocephalic and atraumatic.  Right Ear: External ear normal.  Left Ear: External ear normal.  Nose: Nose normal.  Mouth/Throat: Oropharynx is clear and moist.  Eyes: Conjunctivae and EOM are normal. Pupils are equal, round, and reactive to light.  Neck: Normal range of motion. Neck supple. No JVD present. No tracheal deviation present. No thyromegaly present.  Cardiovascular: Normal rate, regular rhythm, normal heart sounds and intact distal pulses.   Pulmonary/Chest: Effort normal and breath sounds normal.  Abdominal: Soft. Bowel sounds are normal. She exhibits no distension and no mass. There is no tenderness. There is no rebound and no guarding.  Genitourinary: Vagina normal and uterus normal. No vaginal discharge found.  Musculoskeletal: Normal range of motion.  Lymphadenopathy:    She has no cervical adenopathy.  Neurological: She is alert and oriented to person, place, and time.  Skin: Skin is warm and dry.  Psychiatric: She has a normal mood and affect. Her behavior is normal. Judgment and thought content normal.    Assessment & Plan:  1. Physical exam, routine -Anticipatory guidance provided  -Obtain PAP after you turn 21 year old.  2. STD (sexually transmitted disease) - CBC with Differential/Platelet - Comprehensive metabolic panel - HIV antibody (with reflex) - RPR - GC/Chlamydia Probe Amp - POCT urine pregnancy - POCT Wet + KOH  Prep - POCT Microscopic Urinalysis ([facility]) - POCT urinalysis dipstick  3. Screening, lipid  4. Thyroid disorder screen  5. Encounter for other general counseling or advise on contraception -Start ortho-cyclen birth control pill pack today. Use condoms for the next three weeks to ensure the  birth control pills have maximized effectiveness.  I will contact you regarding your lab results.   Godfrey Pick. Tiburcio Pea, MSN, FNP-C Primary Care at [facility] [phone]

## 2016-05-06 LAB — CBC WITH DIFFERENTIAL/PLATELET
BASOS: 0 %
Basophils Absolute: 0 10*3/uL (ref 0.0–0.2)
EOS (ABSOLUTE): 0.1 10*3/uL (ref 0.0–0.4)
EOS: 2 %
HEMATOCRIT: 43.6 % (ref 34.0–46.6)
HEMOGLOBIN: 13.9 g/dL (ref 11.1–15.9)
Immature Grans (Abs): 0 10*3/uL (ref 0.0–0.1)
Immature Granulocytes: 0 %
LYMPHS: 41 %
Lymphocytes Absolute: 2 10*3/uL (ref 0.7–3.1)
MCH: 26.7 pg (ref 26.6–33.0)
MCHC: 31.9 g/dL (ref 31.5–35.7)
MCV: 84 fL (ref 79–97)
MONOCYTES: 8 %
Monocytes Absolute: 0.4 10*3/uL (ref 0.1–0.9)
Neutrophils Absolute: 2.4 10*3/uL (ref 1.4–7.0)
Neutrophils: 49 %
Platelets: 228 10*3/uL (ref 150–379)
RBC: 5.2 x10E6/uL (ref 3.77–5.28)
RDW: 13.5 % (ref 12.3–15.4)
WBC: 4.9 10*3/uL (ref 3.4–10.8)

## 2016-05-06 LAB — COMPREHENSIVE METABOLIC PANEL
ALBUMIN: 4.4 g/dL (ref 3.5–5.5)
ALT: 9 IU/L (ref 0–32)
AST: 18 IU/L (ref 0–40)
Albumin/Globulin Ratio: 1.6 (ref 1.2–2.2)
Alkaline Phosphatase: 62 IU/L (ref 39–117)
BUN/Creatinine Ratio: 16 (ref 9–23)
BUN: 14 mg/dL (ref 6–20)
Bilirubin Total: 0.6 mg/dL (ref 0.0–1.2)
CO2: 23 mmol/L (ref 18–29)
Calcium: 9.1 mg/dL (ref 8.7–10.2)
Chloride: 100 mmol/L (ref 96–106)
Creatinine, Ser: 0.89 mg/dL (ref 0.57–1.00)
GFR, EST AFRICAN AMERICAN: 108 mL/min/{1.73_m2} (ref >59)
GFR, EST NON AFRICAN AMERICAN: 94 mL/min/{1.73_m2} (ref >59)
Globulin, Total: 2.7 g/dL (ref 1.5–4.5)
Glucose: 74 mg/dL (ref 65–99)
POTASSIUM: 4.3 mmol/L (ref 3.5–5.2)
Sodium: 141 mmol/L (ref 134–144)
TOTAL PROTEIN: 7.1 g/dL (ref 6.0–8.5)

## 2016-05-06 LAB — HIV ANTIBODY (ROUTINE TESTING W REFLEX): HIV Screen 4th Generation wRfx: NONREACTIVE

## 2016-05-06 LAB — LIPID PANEL
CHOL/HDL RATIO: 2.4 ratio (ref 0.0–4.4)
Cholesterol, Total: 139 mg/dL (ref 100–199)
HDL: 59 mg/dL (ref >39)
LDL CALC: 68 mg/dL (ref 0–99)
Triglycerides: 60 mg/dL (ref 0–149)
VLDL Cholesterol Cal: 12 mg/dL (ref 5–40)

## 2016-05-06 LAB — TSH: TSH: 1.04 u[IU]/mL (ref 0.450–4.500)

## 2016-05-06 LAB — RPR: RPR Ser Ql: NONREACTIVE

## 2016-05-09 LAB — GC/CHLAMYDIA PROBE AMP
CHLAMYDIA, DNA PROBE: NEGATIVE
Neisseria gonorrhoeae by PCR: NEGATIVE

## 2016-05-10 ENCOUNTER — Encounter: Payer: Self-pay | Admitting: Family Medicine

## 2016-05-16 ENCOUNTER — Other Ambulatory Visit: Payer: Self-pay | Admitting: Family Medicine

## 2016-05-16 DIAGNOSIS — R51 Headache: Principal | ICD-10-CM

## 2016-05-16 DIAGNOSIS — R519 Headache, unspecified: Secondary | ICD-10-CM

## 2016-05-16 NOTE — Progress Notes (Signed)
Referral placed to see a headache specialist.

## 2016-05-29 ENCOUNTER — Encounter: Payer: Self-pay | Admitting: Family Medicine

## 2016-05-29 NOTE — Telephone Encounter (Signed)
You will need to come into the office to have vaginal  lesion evaluated. Please call the office to schedule an appointment at [phone].  Godfrey PickKimberly S. Tiburcio PeaHarris, MSN, FNP-C Primary Care at [facility] [phone]

## 2016-06-14 ENCOUNTER — Ambulatory Visit: Payer: BLUE CROSS/BLUE SHIELD | Admitting: Neurology

## 2016-06-14 ENCOUNTER — Telehealth: Payer: Self-pay

## 2016-06-14 NOTE — Telephone Encounter (Signed)
R/s to 07/02/16.

## 2016-06-19 ENCOUNTER — Encounter: Payer: Self-pay | Admitting: Neurology

## 2016-07-02 ENCOUNTER — Encounter (INDEPENDENT_AMBULATORY_CARE_PROVIDER_SITE_OTHER): Payer: Self-pay

## 2016-07-02 ENCOUNTER — Ambulatory Visit (INDEPENDENT_AMBULATORY_CARE_PROVIDER_SITE_OTHER): Payer: BLUE CROSS/BLUE SHIELD | Admitting: Neurology

## 2016-07-02 ENCOUNTER — Encounter: Payer: Self-pay | Admitting: Neurology

## 2016-07-02 VITALS — Ht 62.0 in | Wt 107.6 lb

## 2016-07-02 DIAGNOSIS — G43001 Migraine without aura, not intractable, with status migrainosus: Secondary | ICD-10-CM | POA: Diagnosis not present

## 2016-07-02 MED ORDER — RIZATRIPTAN BENZOATE 10 MG PO TBDP: 10.0000 mg | ORAL_TABLET | ORAL | 11 refills | Status: DC | PRN

## 2016-07-02 MED ORDER — ONDANSETRON 4 MG PO TBDP: 4.0000 mg | ORAL_TABLET | Freq: Three times a day (TID) | ORAL | 3 refills | Status: DC | PRN

## 2016-07-02 NOTE — Progress Notes (Signed)
[facility]    Provider:  Dr Lucia Gaskins Referring Provider: Daria Pastures* Primary Care Physician:  Daria Pastures*  CC:  migraines  HPI:  Terri Combs is a 21 y.o. female here as a referral from Dr. Tiburcio Pea for migraines. PMHx migraines. Started early teens. Mom has migraines. They are pounding and throbbing on both sides, she has associated dizziness. Light and sound don't bother her. She recently changed birth control and the frequency has decreased. She can get a migraine twice a month and can last all day and be severe. She does get an aura sometimes. Advil has helped in the past. Napping helps. Decreased sleep can trigger them. No vision or sensory changes. She has neck stiffness. She has nausea when it is severe. No vomiting. No other focal neurologic deficits, associated symptoms, inciting events or modifiable factors.  Reviewed notes, labs and imaging from outside physicians, which showed:  Reviewed labs CMP normal.   Review of Systems: Patient complains of symptoms per HPI as well as the following symptoms: no CP, no SOB. Pertinent negatives per HPI. All others negative.   Social History   Social History  . Marital status: Single    Spouse name: N/A  . Number of children: 0  . Years of education: 12   Occupational History  . [employer]    Social History Main Topics  . Smoking status: Never Smoker  . Smokeless tobacco: Never Used  . Alcohol use No  . Drug use: No  . Sexual activity: Not on file   Other Topics Concern  . Not on file   Social History Narrative   Lives at home w/ her parents   Right-handed   Caffeine: soda daily    Family History  Problem Relation Age of Onset  . Migraines Mother     Past Medical History:  Diagnosis Date  . Migraine with aura   . Scoliosis     Past Surgical History:  Procedure Laterality Date  . NO PAST SURGERIES      Current Outpatient Prescriptions  Medication Sig Dispense Refill  .  norgestimate-ethinyl estradiol (ORTHO-CYCLEN,SPRINTEC,PREVIFEM) 0.25-35 MG-MCG tablet Take 1 tablet by mouth daily. 3 Package 11  . ibuprofen (ADVIL) 200 MG tablet Take 200 mg by mouth every 6 (six) hours as needed.    . ondansetron (ZOFRAN-ODT) 4 MG disintegrating tablet Take 1 tablet (4 mg total) by mouth every 8 (eight) hours as needed for nausea. 30 tablet 3  . rizatriptan (MAXALT-MLT) 10 MG disintegrating tablet Take 1 tablet (10 mg total) by mouth as needed for migraine. May repeat in 2 hours if needed 9 tablet 11   No current facility-administered medications for this visit.     Allergies as of 07/02/2016  . (No Known Allergies)    Vitals: Ht 5\' 2"  (1.575 m)   Wt 107 lb 9.6 oz (48.8 kg)   BMI 19.68 kg/m  Last Weight:  Wt Readings from Last 1 Encounters:  07/02/16 107 lb 9.6 oz (48.8 kg)   Last Height:   Ht Readings from Last 1 Encounters:  07/02/16 5\' 2"  (1.575 m)    Physical exam: Exam: Gen: NAD, conversant, well nourised, thin, well groomed                     CV: RRR, no MRG. No Carotid Bruits. No peripheral edema, warm, nontender Eyes: Conjunctivae clear without exudates or hemorrhage  Neuro: Detailed Neurologic Exam  Speech:    Speech is normal;  fluent and spontaneous with normal comprehension.  Cognition:    The patient is oriented to person, place, and time;     recent and remote memory intact;     language fluent;     normal attention, concentration,     fund of knowledge Cranial Nerves:    The pupils are equal, round, and reactive to light. The fundi are normal and spontaneous venous pulsations are present. Visual fields are full to finger confrontation. Extraocular movements are intact. Trigeminal sensation is intact and the muscles of mastication are normal. The face is symmetric. The palate elevates in the midline. Hearing intact. Voice is normal. Shoulder shrug is normal. The tongue has normal motion without fasciculations.   Coordination:    Normal  finger to nose and heel to shin. Normal rapid alternating movements.   Gait:    Heel-toe and tandem gait are normal.   Motor Observation:    No asymmetry, no atrophy, and no involuntary movements noted. Tone:    Normal muscle tone.    Posture:    Posture is normal. normal erect    Strength:    Strength is V/V in the upper and lower limbs.      Sensation: intact to LT     Reflex Exam:  DTR's:    Deep tendon reflexes in the upper and lower extremities are normal bilaterally.   Toes:    The toes are downgoing bilaterally.   Clonus:    Clonus is absent.      Assessment/Plan:  21 year old female with episodic migraines with aura. Patient feels improved since changing birth control. She is having 2 migraines a month, discussed preventative and acute treatment. At this time will choose only acute treatment. Discussed treatment plans with the patient. We'll start Maxalt that she may have taken this in the past with good efficacy. She is to email me if she has side effects or if it is not  As far as your medications are concerned, I would like to suggest: Maxalt: Please take one tablet at the onset of your headache. If it does not improve the symptoms please take one additional tablet in 2 hours. Do not take more then 2 tablets in 24hrs. Do not take use more then 2 to 3 times in a week. Zofran for nausea, may take together.  Discussed: To prevent or relieve headaches, try the following: Cool Compress. Lie down and place a cool compress on your head.  Avoid headache triggers. If certain foods or odors seem to have triggered your migraines in the past, avoid them. A headache diary might help you identify triggers.  Include physical activity in your daily routine. Try a daily walk or other moderate aerobic exercise.  Manage stress. Find healthy ways to cope with the stressors, such as delegating tasks on your to-do list.  Practice relaxation techniques. Try deep breathing, yoga, massage  and visualization.  Eat regularly. Eating regularly scheduled meals and maintaining a healthy diet might help prevent headaches. Also, drink plenty of fluids.  Follow a regular sleep schedule. Sleep deprivation might contribute to headaches Consider biofeedback. With this mind-body technique, you learn to control certain bodily functions - such as muscle tension, heart rate and blood pressure - to prevent headaches or reduce headache pain.    Proceed to emergency room if you experience new or worsening symptoms or symptoms do not resolve, if you have new neurologic symptoms or if headache is severe, or for any concerning symptom.    Also  discussed: There is increased risk for stroke in women with migraine with aura and a  Contraindication for the combined contraceptive pill for use by women who have migraine with aura, which is in line with World Health Organisation recommendations. The risk for women with migraine without aura is lower and other risk factors like smoking are far more likely to increase stroke risk than migraine. There is a recommendation for no smoking and for the use of low estrogen or progestogen only pills particularly for women with migraine with aura. It is important however that women with migraine who are taking the pill do not decide to suddenly stop taking it without discussing this with their doctor. Please discuss with your OB/GYN or pcp.   Naomie DeanAntonia Ahern, MD  [facility] [address] [place], [place] [id number]  Phone [phone] Fax [phone]  A total of 40 minutes was spent face-to-face with this patient. Over half this time was spent on counseling patient on the migraine diagnosis and different diagnostic and therapeutic options available.

## 2016-07-02 NOTE — Patient Instructions (Addendum)
Remember to drink plenty of fluid, eat healthy meals and do not skip any meals. Try to eat protein with a every meal and eat a healthy snack such as fruit or nuts in between meals. Try to keep a regular sleep-wake schedule and try to exercise daily, particularly in the form of walking, 20-30 minutes a day, if you can.   As far as your medications are concerned, I would like to suggest: Maxalt: Please take one tablet at the onset of your headache. If it does not improve the symptoms please take one additional tablet in 2 hours. Do not take more then 2 tablets in 24hrs. Do not take use more then 2 to 3 times in a week. Zofran for nausea, may take together.  Our phone number is [phone]. We also have an after hours call service for urgent matters and there is a physician on-call for urgent questions. For any emergencies you know to call 911 or go to the nearest emergency room  Rizatriptan disintegrating tablets What is this medicine? RIZATRIPTAN (rye za TRIP tan) is used to treat migraines with or without aura. An aura is a strange feeling or visual disturbance that warns you of an attack. It is not used to prevent migraines. This medicine may be used for other purposes; ask your health care provider or pharmacist if you have questions. COMMON BRAND NAME(S): Maxalt-MLT What should I tell my health care provider before I take this medicine? They need to know if you have any of these conditions: -bowel disease or colitis -diabetes -family history of heart disease -fast or irregular heart beat -heart or blood vessel disease, angina (chest pain), or previous heart attack -high blood pressure -high cholesterol -history of stroke, transient ischemic attacks (TIAs or mini-strokes), or intracranial bleeding -kidney or liver disease -overweight -poor circulation -postmenopausal or surgical removal of uterus and ovaries -an unusual or allergic reaction to rizatriptan, other medicines, foods, dyes, or  preservatives -pregnant or trying to get pregnant -breast-feeding How should I use this medicine? Take this medicine by mouth. Follow the directions on the prescription label. This medicine is taken at the first symptoms of a migraine. It is not for everyday use. Leave the tablet in the foil package until you are ready to take it. Do not push the tablet through the blister pack. Peel open the blister pack with dry hands and place the tablet on your tongue. The tablet will dissolve rapidly and be swallowed in your saliva. It is not necessary to drink any water to take this medicine. If your migraine headache returns after one dose, you can take another dose as directed. You must leave at least 2 hours between doses, and do not take more than 30 mg total in 24 hours. If there is no improvement at all after the first dose, do not take a second dose without talking to your doctor or health care professional. Do not take your medicine more often than directed. Talk to your pediatrician regarding the use of this medicine in children. While this drug may be prescribed for children as young as 6 years for selected conditions, precautions do apply. Overdosage: If you think you have taken too much of this medicine contact a poison control center or emergency room at once. NOTE: This medicine is only for you. Do not share this medicine with others. What if I miss a dose? This does not apply; this medicine is not for regular use. What may interact with this medicine? Do not  take this medicine with any of the following medicines: -amphetamine, dextroamphetamine or cocaine -dihydroergotamine, ergotamine, ergoloid mesylates, methysergide, or ergot-type medication - do not take within 24 hours of taking rizatriptan -feverfew -MAOIs like Carbex, Eldepryl, Marplan, Nardil, and Parnate - do not take rizatriptan within 2 weeks of stopping MAOI therapy. -other migraine medicines like almotriptan, eletriptan, naratriptan,  sumatriptan, zolmitriptan - do not take within 24 hours of taking rizatriptan -tryptophan This medicine may also interact with the following medications: -medicines for mental depression, anxiety or mood problems -propranolol This list may not describe all possible interactions. Give your health care provider a list of all the medicines, herbs, non-prescription drugs, or dietary supplements you use. Also tell them if you smoke, drink alcohol, or use illegal drugs. Some items may interact with your medicine. What should I watch for while using this medicine? Only take this medicine for a migraine headache. Take it if you get warning symptoms or at the start of a migraine attack. It is not for regular use to prevent migraine attacks. You may get drowsy or dizzy. Do not drive, use machinery, or do anything that needs mental alertness until you know how this medicine affects you. To reduce dizzy or fainting spells, do not sit or stand up quickly, especially if you are an older patient. Alcohol can increase drowsiness, dizziness and flushing. Avoid alcoholic drinks. Smoking cigarettes may increase the risk of heart-related side effects from using this medicine. If you take migraine medicines for 10 or more days a month, your migraines may get worse. Keep a diary of headache days and medicine use. Contact your healthcare professional if your migraine attacks occur more frequently. What side effects may I notice from receiving this medicine? Side effects that you should report to your doctor or health care professional as soon as possible: -allergic reactions like skin rash, itching or hives, swelling of the face, lips, or tongue -fast, slow, or irregular heart beat -increased or decreased blood pressure -seizures -severe stomach pain and cramping, bloody diarrhea -signs and symptoms of a blood clot such as breathing problems; changes in vision; chest pain; severe, sudden headache; pain, swelling, warmth  in the leg; trouble speaking; sudden numbness or weakness of the face, arm or leg -tingling, pain, or numbness in the face, hands, or feet Side effects that usually do not require medical attention (report to your doctor or health care professional if they continue or are bothersome): -drowsiness -dry mouth -feeling warm, flushing, or redness of the face -headache -muscle cramps, pain -nausea, vomiting -unusually weak or tired This list may not describe all possible side effects. Call your doctor for medical advice about side effects. You may report side effects to FDA at 1-800-FDA-1088. Where should I keep my medicine? Keep out of the reach of children. Store at room temperature between 15 and 30 degrees C (59 and 86 degrees F). Protect from light and moisture. Throw away any unused medicine after the expiration date. NOTE: This sheet is a summary. It may not cover all possible information. If you have questions about this medicine, talk to your doctor, pharmacist, or health care provider.  2018 Elsevier/Gold Standard (2012-12-02 10:17:42)  Ondansetron oral dissolving tablet What is this medicine? ONDANSETRON (on DAN se tron) is used to treat nausea and vomiting caused by chemotherapy. It is also used to prevent or treat nausea and vomiting after surgery. This medicine may be used for other purposes; ask your health care provider or pharmacist if you have questions. COMMON  BRAND NAME(S): Zofran ODT What should I tell my health care provider before I take this medicine? They need to know if you have any of these conditions: -heart disease -history of irregular heartbeat -liver disease -low levels of magnesium or potassium in the blood -an unusual or allergic reaction to ondansetron, granisetron, other medicines, foods, dyes, or preservatives -pregnant or trying to get pregnant -breast-feeding How should I use this medicine? These tablets are made to dissolve in the mouth. Do not try  to push the tablet through the foil backing. With dry hands, peel away the foil backing and gently remove the tablet. Place the tablet in the mouth and allow it to dissolve, then swallow. While you may take these tablets with water, it is not necessary to do so. Talk to your pediatrician regarding the use of this medicine in children. Special care may be needed. Overdosage: If you think you have taken too much of this medicine contact a poison control center or emergency room at once. NOTE: This medicine is only for you. Do not share this medicine with others. What if I miss a dose? If you miss a dose, take it as soon as you can. If it is almost time for your next dose, take only that dose. Do not take double or extra doses. What may interact with this medicine? Do not take this medicine with any of the following medications: -apomorphine -certain medicines for fungal infections like fluconazole, itraconazole, ketoconazole, posaconazole, voriconazole -cisapride -dofetilide -dronedarone -pimozide -thioridazine -ziprasidone This medicine may also interact with the following medications: -carbamazepine -certain medicines for depression, anxiety, or psychotic disturbances -fentanyl -linezolid -MAOIs like Carbex, Eldepryl, Marplan, Nardil, and Parnate -methylene blue (injected into a vein) -other medicines that prolong the QT interval (cause an abnormal heart rhythm) -phenytoin -rifampicin -tramadol This list may not describe all possible interactions. Give your health care provider a list of all the medicines, herbs, non-prescription drugs, or dietary supplements you use. Also tell them if you smoke, drink alcohol, or use illegal drugs. Some items may interact with your medicine. What should I watch for while using this medicine? Check with your doctor or health care professional as soon as you can if you have any sign of an allergic reaction. What side effects may I notice from receiving  this medicine? Side effects that you should report to your doctor or health care professional as soon as possible: -allergic reactions like skin rash, itching or hives, swelling of the face, lips, or tongue -breathing problems -confusion -dizziness -fast or irregular heartbeat -feeling faint or lightheaded, falls -fever and chills -loss of balance or coordination -seizures -sweating -swelling of the hands and feet -tightness in the chest -tremors -unusually weak or tired Side effects that usually do not require medical attention (report to your doctor or health care professional if they continue or are bothersome): -constipation or diarrhea -headache This list may not describe all possible side effects. Call your doctor for medical advice about side effects. You may report side effects to FDA at 1-800-FDA-1088. Where should I keep my medicine? Keep out of the reach of children. Store between 2 and 30 degrees C (36 and 86 degrees F). Throw away any unused medicine after the expiration date. NOTE: This sheet is a summary. It may not cover all possible information. If you have questions about this medicine, talk to your doctor, pharmacist, or health care provider.  2018 Elsevier/Gold Standard (2013-01-07 16:21:52)

## 2016-07-06 ENCOUNTER — Encounter: Payer: Self-pay | Admitting: Family Medicine

## 2016-07-06 ENCOUNTER — Encounter: Payer: Self-pay | Admitting: Family

## 2016-07-07 ENCOUNTER — Encounter: Payer: Self-pay | Admitting: Family

## 2016-07-25 ENCOUNTER — Ambulatory Visit (INDEPENDENT_AMBULATORY_CARE_PROVIDER_SITE_OTHER): Payer: BLUE CROSS/BLUE SHIELD | Admitting: Family

## 2016-07-25 ENCOUNTER — Encounter: Payer: Self-pay | Admitting: Family

## 2016-07-25 VITALS — Ht 61.42 in | Wt 105.0 lb

## 2016-07-25 DIAGNOSIS — Z3202 Encounter for pregnancy test, result negative: Secondary | ICD-10-CM

## 2016-07-25 DIAGNOSIS — N898 Other specified noninflammatory disorders of vagina: Secondary | ICD-10-CM

## 2016-07-25 DIAGNOSIS — Z113 Encounter for screening for infections with a predominantly sexual mode of transmission: Secondary | ICD-10-CM | POA: Diagnosis not present

## 2016-07-25 NOTE — Progress Notes (Signed)
THIS RECORD MAY CONTAIN CONFIDENTIAL INFORMATION THAT SHOULD NOT BE RELEASED WITHOUT REVIEW OF THE SERVICE PROVIDER.  Adolescent Medicine Consultation Follow-Up Visit Terri Combs  is a 21 y.o. female referred by No ref. provider found here today for follow-up regarding vaginal discharge.   Last seen in [facility] on 03/31/16 for OCP initiation.   Plan at last visit included start of Camila (POP)  - Pertinent Labs? No - Growth Chart Viewed? no   History was provided by the patient.  PCP Confirmed?  no  My Chart Activated?   yes   Chief complaint: vaginal discharge   HPI:   Update from last visit:  -see by Neurology - cleared for COCs; changed to Sprintec; likes pill but is considering preconception health; would like referral for Derm and GYN. BF is deploying and they are discussing starting family.   Chief Complaint today:  -Having more clumpy white/yellow discharge - has an odor (fishy smell) x 1-2 months; took medication for yeast infection and it went away.  -No condom use. One female partner; no pain with sex; no spotting or unexplained vaginal bleeding.  Some burning with intercourse and after when she urinates; no lesions.  -Hx of BV and yeast infections.   Review of Systems  Constitutional: Negative for malaise/fatigue.  HENT: Negative for sore throat.   Eyes: Negative for double vision.  Cardiovascular: Negative for palpitations.  Gastrointestinal: Negative for abdominal pain, constipation, diarrhea, nausea and vomiting.  Genitourinary: Negative for dysuria.  Musculoskeletal: Negative for joint pain and myalgias.  Skin: Negative for rash.  Neurological: Negative for dizziness and headaches.  Endo/Heme/Allergies: Does not bruise/bleed easily.    No LMP recorded. LMP: 07/17/16 on OCPs.  No Known Allergies Outpatient Medications Prior to Visit  Medication Sig Dispense Refill  . ibuprofen (ADVIL) 200 MG tablet Take 200 mg by mouth every 6 (six) hours as  needed.    . norgestimate-ethinyl estradiol (ORTHO-CYCLEN,SPRINTEC,PREVIFEM) 0.25-35 MG-MCG tablet Take 1 tablet by mouth daily. 3 Package 11  . ondansetron (ZOFRAN-ODT) 4 MG disintegrating tablet Take 1 tablet (4 mg total) by mouth every 8 (eight) hours as needed for nausea. 30 tablet 3  . rizatriptan (MAXALT-MLT) 10 MG disintegrating tablet Take 1 tablet (10 mg total) by mouth as needed for migraine. May repeat in 2 hours if needed 9 tablet 11   No facility-administered medications prior to visit.      There are no active problems to display for this patient.  The following portions of the patient's history were reviewed and updated as appropriate: allergies, current medications, past medical history and problem list.  Physical Exam:  Vitals:   07/25/16 1400  Weight: 105 lb (47.6 kg)  Height: 5' 1.42" (1.56 m)   Ht 5' 1.42" (1.56 m)   Wt 105 lb (47.6 kg)   BMI 19.57 kg/m  Body mass index: body mass index is 19.57 kg/m. Growth percentile SmartLinks can only be used for patients less than 86 years old.  Wt Readings from Last 3 Encounters:  07/25/16 105 lb (47.6 kg)  07/02/16 107 lb 9.6 oz (48.8 kg)  05/05/16 107 lb (48.5 kg)    Physical Exam  Constitutional: No distress.  HENT:  Mouth/Throat: Oropharynx is clear and moist.  Neck: Normal range of motion. No thyromegaly present.  Cardiovascular: Normal rate and regular rhythm.   No murmur heard. Abdominal: Soft.  Genitourinary:  Genitourinary Comments: Deferred, self-swab obtained   Musculoskeletal: Normal range of motion. She exhibits no edema.  Lymphadenopathy:  She has no cervical adenopathy.  Neurological: She is alert.  Skin: Skin is warm and dry. No rash noted.  Psychiatric: She has a normal mood and affect.  Vitals reviewed.    Assessment/Plan: 1. Vaginal discharge -sounds like BV and yeast -will await results to treat -reviewed pH balance and triggers for infections, vaginal hygiene, tx for both BV and  yeast, and condom use.    - WET PREP BY MOLECULAR PROBE  2. Routine screening for STI (sexually transmitted infection) -per above - GC/Chlamydia Probe Amp  3. Pregnancy examination or test, negative result -per protocol  - POCT urine pregnancy  Advised Terri Combs to follow-up with [facility] for referrals to [facility] and Gyn. Advise via My Chart if concerns or questions. Will call with results from today's visit. Encouraged preconception health visit ASAP; she declines prenatal vitamins today.    Follow-up:  Return if symptoms worsen or fail to improve, for with any [facility] provider, GYN/Reproductive Health concerns.   Medical decision-making:  >15 minutes spent face to face with patient with more than 50% of appointment spent discussing diagnosis, management, follow-up, and reviewing of pH balance and triggers for infections, vaginal hygiene, tx for both BV and yeast, and condom use. Also reviewed importance of preconception health.

## 2016-07-26 ENCOUNTER — Encounter: Payer: Self-pay | Admitting: Family

## 2016-07-26 ENCOUNTER — Other Ambulatory Visit: Payer: Self-pay | Admitting: Pediatrics

## 2016-07-26 ENCOUNTER — Encounter: Payer: Self-pay | Admitting: Family Medicine

## 2016-07-26 ENCOUNTER — Encounter: Payer: Self-pay | Admitting: Neurology

## 2016-07-26 LAB — POCT URINE PREGNANCY: Preg Test, Ur: NEGATIVE

## 2016-07-26 LAB — GC/CHLAMYDIA PROBE AMP
CT Probe RNA: NOT DETECTED
GC Probe RNA: NOT DETECTED

## 2016-07-26 LAB — WET PREP BY MOLECULAR PROBE
Candida species: DETECTED — AB
GARDNERELLA VAGINALIS: NOT DETECTED
Trichomonas vaginosis: NOT DETECTED

## 2016-07-26 MED ORDER — FLUCONAZOLE 150 MG PO TABS: ORAL_TABLET | ORAL | 0 refills | Status: DC

## 2016-08-09 ENCOUNTER — Encounter: Payer: Self-pay | Admitting: Neurology

## 2016-11-15 ENCOUNTER — Encounter: Payer: Self-pay | Admitting: Neurology

## 2016-11-15 ENCOUNTER — Telehealth: Payer: Self-pay | Admitting: Neurology

## 2016-11-15 ENCOUNTER — Other Ambulatory Visit: Payer: Self-pay | Admitting: Neurology

## 2016-11-15 MED ORDER — SUMATRIPTAN SUCCINATE 50 MG PO TABS: 50.0000 mg | ORAL_TABLET | ORAL | 5 refills | Status: DC | PRN

## 2016-11-15 MED ORDER — ALMOTRIPTAN MALATE 12.5 MG PO TABS: 12.5000 mg | ORAL_TABLET | ORAL | 6 refills | Status: DC | PRN

## 2016-11-15 MED ORDER — SUMATRIPTAN SUCCINATE 50 MG PO TABS: 50.0000 mg | ORAL_TABLET | ORAL | 0 refills | Status: DC | PRN

## 2016-11-15 NOTE — Telephone Encounter (Signed)
Pt reported that almotriptan was not covered by her insurance. Discussed w/ Dr. Lucia GaskinsAhern. Sumatriptan sent in.

## 2016-11-15 NOTE — Telephone Encounter (Signed)
Call her and see what the issue is, are her migraines worsening? Is the maxlat not working? Is the maxalt not lasting long enough? Maybe we can tweak it a little.

## 2016-11-15 NOTE — Addendum Note (Signed)
Addended by: Donnelly AngelicaHOGAN, JENNIFER L on: 11/15/2016 06:01 PM   Modules accepted: Orders

## 2016-11-15 NOTE — Telephone Encounter (Signed)
I gave her Almotriptan. fyi

## 2016-11-15 NOTE — Telephone Encounter (Signed)
Returned pt's call. She says that when she woke up and was getting ready, she noticed a migraine starting w/ symptoms of blurry vision so she called out of work today as she was afraid to drive. Tried Maxalt a couple of months ago for a migraine and then took a nap but woke up w/ sever nausea. She has not wanted to take it again. This morning, she has only taken ibuprofen but still feels aura sensation. Reports about 3 migraines per month since around May. Would like to try a different medication.

## 2016-11-15 NOTE — Telephone Encounter (Signed)
Pt is calling back to see if a decision was re: if she could be seen or if a change would be made to her medication, please give pt a call back

## 2016-11-15 NOTE — Telephone Encounter (Signed)
Pt called the office said she's called out of work today with a migraine and wanting to be seen today. Said she is wanting to change maxalt. She feels the maxalt is not helping. Please call

## 2016-11-15 NOTE — Telephone Encounter (Signed)
New patient appt for migraines was in March. She was prescribed Zofran and Maxalt at that time. Has a follow-up appt currently scheduled in Sept.

## 2016-11-15 NOTE — Telephone Encounter (Signed)
Pt calling back to inquire about a note as a result of her taking off work today due to her migraines, please call.

## 2017-01-07 ENCOUNTER — Encounter: Payer: Self-pay | Admitting: Neurology

## 2017-01-07 ENCOUNTER — Ambulatory Visit (INDEPENDENT_AMBULATORY_CARE_PROVIDER_SITE_OTHER): Payer: BLUE CROSS/BLUE SHIELD | Admitting: Neurology

## 2017-01-07 DIAGNOSIS — F0280 Dementia in other diseases classified elsewhere without behavioral disturbance: Secondary | ICD-10-CM | POA: Insufficient documentation

## 2017-01-07 DIAGNOSIS — G43109 Migraine with aura, not intractable, without status migrainosus: Secondary | ICD-10-CM | POA: Diagnosis not present

## 2017-01-07 DIAGNOSIS — G309 Alzheimer's disease, unspecified: Secondary | ICD-10-CM

## 2017-01-07 NOTE — Progress Notes (Signed)
[facility]   Provider:  Dr Lucia Gaskins Referring Provider: Daria Pastures* Primary Care Physician:  Daria Pastures*  CC:  migraines  Migraines 1-2x a month. Maxalt did not work but Imitrex helps. Took an hour. She can't take it at work however because she is afraid of how it will affect her. The imitrex works in an hour. Infrequent migraines 1-2x a month. Discussed taking Cambia at work for acute management and can take it with imitrex as well.  HPI:  Terri Combs is a 21 y.o. female here as a referral from Dr. Tiburcio Pea for migraines. PMHx migraines. Started early teens. Mom has migraines. They are pounding and throbbing on both sides, she has associated dizziness. Light and sound don't bother her. She recently changed birth control and the frequency has decreased. She can get a migraine twice a month and can last all day and be severe. She does get an aura sometimes. Advil has helped in the past. Napping helps. Decreased sleep can trigger them. No vision or sensory changes. She has neck stiffness. She has nausea when it is severe. No vomiting. No other focal neurologic deficits, associated symptoms, inciting events or modifiable factors.  Reviewed notes, labs and imaging from outside physicians, which showed:  Reviewed labs CMP normal.   Review of Systems: Patient complains of symptoms per HPI as well as the following symptoms: no CP, no SOB. Pertinent negatives per HPI. All others negative.   Social History   Social History  . Marital status: Single    Spouse name: N/A  . Number of children: 0  . Years of education: 12   Occupational History  . [employer]    Social History Main Topics  . Smoking status: Never Smoker  . Smokeless tobacco: Never Used  . Alcohol use No  . Drug use: No  . Sexual activity: Yes    Partners: Male     Comment: 07/25/16-unprotected sex 2 weeks ago   Other Topics Concern  . Not on file   Social History Narrative   Lives at home w/ her parents   Right-handed   Caffeine: soda daily    Family History  Problem Relation Age of Onset  . Migraines Mother     Past Medical History:  Diagnosis Date  . Migraine with aura   . Scoliosis     Past Surgical History:  Procedure Laterality Date  . NO PAST SURGERIES      Current Outpatient Prescriptions  Medication Sig Dispense Refill  . fluconazole (DIFLUCAN) 150 MG tablet Take 1 tablet on day 1, 1 on day 3 and 1 on day 7. 3 tablet 0  . ibuprofen (ADVIL) 200 MG tablet Take 200 mg by mouth every 6 (six) hours as needed.    . ondansetron (ZOFRAN-ODT) 4 MG disintegrating tablet Take 1 tablet (4 mg total) by mouth every 8 (eight) hours as needed for nausea. 30 tablet 3  . SUMAtriptan (IMITREX) 50 MG tablet Take 1 tablet (50 mg total) by mouth every 2 (two) hours as needed for migraine. May repeat in 2 hours. Max of 2 tabs per day. 10 tablet 5   No current facility-administered medications for this visit.     Allergies as of 01/07/2017  . (No Known Allergies)    Vitals: BP 98/66   Pulse 77   Ht 5' 1.75" (1.568 m)   Wt 112 lb 3.2 oz (50.9 kg)   BMI 20.69 kg/m  Last Weight:  Wt Readings from Last 1  Encounters:  01/07/17 112 lb 3.2 oz (50.9 kg)   Last Height:   Ht Readings from Last 1 Encounters:  01/07/17 5' 1.75" (1.568 m)    Physical exam: Exam: Gen: NAD, conversant, well nourised, well groomed                     CV: RRR, no MRG. No Carotid Bruits. No peripheral edema, warm, nontender Eyes: Conjunctivae clear without exudates or hemorrhage  Neuro: Detailed Neurologic Exam  Speech:    Speech is normal; fluent and spontaneous with normal comprehension.  Cognition:    The patient is oriented to person, place, and time;     recent and remote memory intact;     language fluent;     normal attention, concentration,     fund of knowledge Cranial Nerves:    The pupils are equal, round, and reactive to light. The fundi are normal and  spontaneous venous pulsations are present. Visual fields are full to finger confrontation. Extraocular movements are intact. Trigeminal sensation is intact and the muscles of mastication are normal. The face is symmetric. The palate elevates in the midline. Hearing intact. Voice is normal. Shoulder shrug is normal. The tongue has normal motion without fasciculations.   Coordination:    Normal finger to nose and heel to shin. Normal rapid alternating movements.   Gait:    Heel-toe and tandem gait are normal.   Motor Observation:    No asymmetry, no atrophy, and no involuntary movements noted. Tone:    Normal muscle tone.    Posture:    Posture is normal. normal erect    Strength:    Strength is V/V in the upper and lower limbs.      Sensation: intact to LT     Reflex Exam:  DTR's:    Deep tendon reflexes in the upper and lower extremities are normal bilaterally.   Toes:    The toes are downgoing bilaterally.   Clonus:    Clonus is absent.     Assessment/Plan:  21 year old female with migraines with and without aura. Patient feels improved since changing birth control. She is having 2 migraines a month, discussed preventative and acute treatment. At this time will choose only acute treatment. Discussed treatment plans with the patient. We'll continue imitrex that she may have taken this in the past with good efficacy. Also cambia will be ordered to try at work and with imitrex.   As far as your medications are concerned, I would like to suggest: Imitrex: Please take one tablet at the onset of your headache. If it does not improve the symptoms please take one additional tablet in 2 hours. Do not take more then 2 tablets in 24hrs. Do not take use more then 2 to 3 times in a week. Zofran for nausea, may take together  There is increased risk for stroke in women with migraine with aura and a  Contraindication for the combined contraceptive pill for use by women who have migraine with  aura, which is in line with World Health Organisation recommendations. The risk for women with migraine without aura is lower and other risk factors like smoking are far more likely to increase stroke risk than migraine. There is a recommendation for no smoking and for the use of low estrogen or progestogen only pills particularly for women with migraine with aura. It is important however that women with migraine who are taking the pill do not decide to suddenly stop  taking it without discussing this with their doctor. Please discuss with her OB/GYN.  Discussed: To prevent or relieve headaches, try the following: Cool Compress. Lie down and place a cool compress on your head.  Avoid headache triggers. If certain foods or odors seem to have triggered your migraines in the past, avoid them. A headache diary might help you identify triggers.  Include physical activity in your daily routine. Try a daily walk or other moderate aerobic exercise.  Manage stress. Find healthy ways to cope with the stressors, such as delegating tasks on your to-do list.  Practice relaxation techniques. Try deep breathing, yoga, massage and visualization.  Eat regularly. Eating regularly scheduled meals and maintaining a healthy diet might help prevent headaches. Also, drink plenty of fluids.  Follow a regular sleep schedule. Sleep deprivation might contribute to headaches Consider biofeedback. With this mind-body technique, you learn to control certain bodily functions - such as muscle tension, heart rate and blood pressure - to prevent headaches or reduce headache pain.    Proceed to emergency room if you experience new or worsening symptoms or symptoms do not resolve, if you have new neurologic symptoms or if headache is severe, or for any concerning symptom.   Provided education and documentation from American headache Society toolbox including articles on: chronic migraine medication overuse headache, chronic migraines,  prevention of migraines, behavioral and other nonpharmacologic treatments for headache.    Naomie Dean, MD  [facility] [address] [place], [place] [address]  Phone [phone] Fax [phone]  A total of 15 minutes was spent in with this patient. Over half this time was spent on counseling patient on the migraine diagnosis and different therapeutic options available.

## 2017-01-09 MED ORDER — DICLOFENAC POTASSIUM(MIGRAINE) 50 MG PO PACK: 50.0000 mg | PACK | Freq: Once | ORAL | 11 refills | Status: DC | PRN

## 2017-01-21 ENCOUNTER — Encounter: Payer: Self-pay | Admitting: Neurology

## 2017-01-21 ENCOUNTER — Telehealth: Payer: Self-pay | Admitting: Neurology

## 2017-01-21 NOTE — Telephone Encounter (Signed)
Toma Copier, would u print the letter I wrote for patient today and give it to me in the morning? For some reason I can;t print letters. Thank you!

## 2017-01-22 ENCOUNTER — Encounter: Payer: Self-pay | Admitting: *Deleted

## 2017-01-28 ENCOUNTER — Encounter: Payer: Self-pay | Admitting: Physician Assistant

## 2017-01-28 ENCOUNTER — Ambulatory Visit (INDEPENDENT_AMBULATORY_CARE_PROVIDER_SITE_OTHER): Payer: BLUE CROSS/BLUE SHIELD | Admitting: Physician Assistant

## 2017-01-28 VITALS — HR 80 | Resp 16 | Ht 61.75 in | Wt 112.6 lb

## 2017-01-28 DIAGNOSIS — L539 Erythematous condition, unspecified: Secondary | ICD-10-CM

## 2017-01-28 DIAGNOSIS — Z113 Encounter for screening for infections with a predominantly sexual mode of transmission: Secondary | ICD-10-CM | POA: Diagnosis not present

## 2017-01-28 DIAGNOSIS — Z124 Encounter for screening for malignant neoplasm of cervix: Secondary | ICD-10-CM

## 2017-01-28 DIAGNOSIS — Z30011 Encounter for initial prescription of contraceptive pills: Secondary | ICD-10-CM

## 2017-01-28 LAB — POCT URINE PREGNANCY: PREG TEST UR: NEGATIVE

## 2017-01-28 MED ORDER — NORGESTIM-ETH ESTRAD TRIPHASIC 0.18/0.215/0.25 MG-25 MCG PO TABS: 1.0000 | ORAL_TABLET | Freq: Every day | ORAL | 11 refills | Status: DC

## 2017-01-28 NOTE — Patient Instructions (Signed)
     IF you received an x-ray today, you will receive an invoice from [facility]. Please contact [facility] at [phone] with questions or concerns regarding your invoice.   IF you received labwork today, you will receive an invoice from [facility]. Please contact [facility] at [phone] with questions or concerns regarding your invoice.   Our billing staff will not be able to assist you with questions regarding bills from these companies.  You will be contacted with the lab results as soon as they are available. The fastest way to get your results is to activate your My Chart account. Instructions are located on the last page of this paperwork. If you have not heard from us regarding the results in 2 weeks, please contact this office.     

## 2017-01-28 NOTE — Progress Notes (Signed)
01/29/2017 5:39 PM   DOB: June 27, 1995 / MRN: [id number]  SUBJECTIVE:  Terri Combs is a 21 y.o. female presenting to restart OCP.  Has been on Sprintec in the past and felt that this worked well for her.  Tells me that she had some hair thinning with sprintec.  She would like to gain weight. She tends to have light periods and that the OCP made these even lighter.   She would like to see a dermatologist regarding a very fine rash on her cheeks. She denies any burning about her lips. She wants to see a   She has No Known Allergies.   She  has a past medical history of Migraine with aura and Scoliosis.    She  reports that she has never smoked. She has never used smokeless tobacco. She reports that she does not drink alcohol or use drugs. She  reports that she currently engages in sexual activity and has had female partners. The patient  has a past surgical history that includes No past surgeries.  Her family history includes Migraines in her mother.  Review of Systems  Constitutional: Negative for chills, diaphoresis and fever.  Eyes: Negative.   Respiratory: Negative for cough, hemoptysis, sputum production, shortness of breath and wheezing.   Cardiovascular: Negative for chest pain, orthopnea and leg swelling.  Gastrointestinal: Negative for nausea.  Skin: Negative for rash.  Neurological: Negative for dizziness, sensory change, speech change, focal weakness and headaches.    The problem list and medications were reviewed and updated by myself where necessary and exist elsewhere in the encounter.   OBJECTIVE:  Pulse 80   Resp 16   Ht 5' 1.75" (1.568 m)   Wt 112 lb 9.6 oz (51.1 kg)   LMP 01/25/2017   SpO2 98%   BMI 20.76 kg/m   Physical Exam  Constitutional: She is oriented to person, place, and time. She is active.  Non-toxic appearance.  HENT:  Head:    Eyes: Pupils are equal, round, and reactive to light. EOM are normal.  Cardiovascular: Normal rate, regular rhythm, S1  normal, S2 normal, normal heart sounds and intact distal pulses.  Exam reveals no gallop, no friction rub and no decreased pulses.   No murmur heard. Pulmonary/Chest: Effort normal. No stridor. No tachypnea. No respiratory distress. She has no wheezes. She has no rales.  Abdominal: She exhibits no distension.  Musculoskeletal: She exhibits no edema.  Neurological: She is alert and oriented to person, place, and time. She has normal strength and normal reflexes. She is not disoriented. She displays no atrophy. No cranial nerve deficit or sensory deficit. She exhibits normal muscle tone. Coordination and gait normal.  Skin: Skin is warm and dry. She is not diaphoretic. There is erythema. No pallor.  Psychiatric: Her behavior is normal.    Results for orders placed or performed in visit on 01/28/17 (from the past 72 hour(s))  POCT urine pregnancy     Status: Normal   Collection Time: 01/28/17  4:54 PM  Result Value Ref Range   Preg Test, Ur Negative Negative  HIV antibody     Status: None   Collection Time: 01/28/17  5:18 PM  Result Value Ref Range   HIV Screen 4th Generation wRfx Non Reactive Non Reactive  RPR     Status: None   Collection Time: 01/28/17  5:18 PM  Result Value Ref Range   RPR Ser Ql Non Reactive Non Reactive    No results found.  ASSESSMENT AND PLAN:  Terri Combs was seen today for contraception.  Diagnoses and all orders for this visit:  Facial erythema: Undifferentiated.  She wants to see a dermatologist.  I will refer her however did advise that her cheeks appears to be within the spectrum of normal in my opinion.  She continued to request derm.  -     Ambulatory referral to Dermatology  Cervical cancer screening -     Pap IG, CT/NG w/ reflex HPV when ASC-U  Screening examination for STD (sexually transmitted disease) -     HIV antibody -     RPR -     GC/Chlamydia Probe Amp  Initiation of OCP (BCP) -     POCT urine pregnancy -     Norgestimate-Ethinyl  Estradiol Triphasic 0.18/0.215/0.25 MG-25 MCG tab; Take 1 tablet by mouth daily.    The patient is advised to call or return to clinic if she does not see an improvement in symptoms, or to seek the care of the closest emergency department if she worsens with the above plan.   Deliah Boston, MHS, PA-C Primary Care at [facility] 01/29/2017 5:39 PM

## 2017-01-29 LAB — HIV ANTIBODY (ROUTINE TESTING W REFLEX): HIV SCREEN 4TH GENERATION: NONREACTIVE

## 2017-01-29 LAB — PAP IG, CT-NG, RFX HPV ASCU
CHLAMYDIA, NUC. ACID AMP: NEGATIVE
Gonococcus by Nucleic Acid Amp: NEGATIVE
PAP SMEAR COMMENT: 0

## 2017-01-29 LAB — RPR: RPR Ser Ql: NONREACTIVE

## 2017-01-30 ENCOUNTER — Encounter: Payer: Self-pay | Admitting: Physician Assistant

## 2017-01-30 LAB — GC/CHLAMYDIA PROBE AMP
CHLAMYDIA, DNA PROBE: NEGATIVE
NEISSERIA GONORRHOEAE BY PCR: NEGATIVE

## 2017-02-05 ENCOUNTER — Telehealth: Payer: Self-pay | Admitting: Family Medicine

## 2017-02-08 NOTE — Telephone Encounter (Signed)
Please advise 

## 2017-02-08 NOTE — Telephone Encounter (Signed)
Pt is checking on status of her question regarding her birth control   Best number [phone]

## 2017-02-08 NOTE — Telephone Encounter (Signed)
[facility].

## 2017-03-09 ENCOUNTER — Other Ambulatory Visit: Payer: Self-pay

## 2017-03-09 ENCOUNTER — Encounter: Payer: Self-pay | Admitting: Physician Assistant

## 2017-03-09 ENCOUNTER — Ambulatory Visit: Payer: BLUE CROSS/BLUE SHIELD | Admitting: Physician Assistant

## 2017-03-09 VITALS — BP 115/75 | HR 85 | Temp 99.4°F | Resp 16 | Ht 61.75 in | Wt 113.0 lb

## 2017-03-09 DIAGNOSIS — R300 Dysuria: Secondary | ICD-10-CM | POA: Diagnosis not present

## 2017-03-09 DIAGNOSIS — N761 Subacute and chronic vaginitis: Secondary | ICD-10-CM | POA: Diagnosis not present

## 2017-03-09 DIAGNOSIS — N898 Other specified noninflammatory disorders of vagina: Secondary | ICD-10-CM | POA: Diagnosis not present

## 2017-03-09 DIAGNOSIS — B001 Herpesviral vesicular dermatitis: Secondary | ICD-10-CM

## 2017-03-09 LAB — POCT URINALYSIS DIP (MANUAL ENTRY)
Bilirubin, UA: NEGATIVE
Glucose, UA: NEGATIVE mg/dL
Ketones, POC UA: NEGATIVE mg/dL
Leukocytes, UA: NEGATIVE
Nitrite, UA: NEGATIVE
Spec Grav, UA: 1.025 (ref 1.010–1.025)
Urobilinogen, UA: 0.2 U/dL
pH, UA: 7 (ref 5.0–8.0)

## 2017-03-09 LAB — POCT WET + KOH PREP
Trich by wet prep: ABSENT
Yeast by KOH: ABSENT
Yeast by wet prep: ABSENT

## 2017-03-09 MED ORDER — METRONIDAZOLE 0.75 % VA GEL: 1.0000 | Freq: Every day | VAGINAL | 0 refills | Status: AC

## 2017-03-09 MED ORDER — VALACYCLOVIR HCL 1 G PO TABS: 2000.0000 mg | ORAL_TABLET | Freq: Two times a day (BID) | ORAL | 0 refills | Status: DC

## 2017-03-09 NOTE — Patient Instructions (Addendum)
Your tests are negative for BV and yeast.  Start using Metrogel suppositories at night before bed for the next 5 days. This should help your symptoms. Wear underwear while doing this.  Stay well hydrated. Consider taking a probiotic.  Come back if you are not better in 5-7 days.   Valacyclovir is for your cold sores. Start taking this at this very first sign of symptom onset. This will shorten the course of the outbreak.   Thank you for coming in today. I hope you feel we met your needs.  Feel free to call PCP if you have any questions or further requests.  Please consider signing up for MyChart if you do not already have it, as this is a great way to communicate with me.  Best,  Whitney McVey, PA-C   IF you received an x-ray today, you will receive an invoice from [facility]. Please contact [facility] at [phone] with questions or concerns regarding your invoice.   IF you received labwork today, you will receive an invoice from [facility]. Please contact [facility] at [phone] with questions or concerns regarding your invoice.   Our billing staff will not be able to assist you with questions regarding bills from these companies.  You will be contacted with the lab results as soon as they are available. The fastest way to get your results is to activate your My Chart account. Instructions are located on the last page of this paperwork. If you have not heard from [place] regarding the results in 2 weeks, please contact this office.

## 2017-03-09 NOTE — Progress Notes (Signed)
Terri Combs  MRN: [id number] DOB: 1996-02-22  PCP: Patient, No Pcp Per  Subjective:  Pt is a 21 year old female who presents to clinic for vaginal discomfort x few weeks. Endorses itching, irritation and a burning sensation during intercourse. +white discharge.  She has not taken anything to feel better. She denies fever chills, urinary frequency or urgency, back pain, flank pain, abdominal pain, dyspareunia.  Tested negative for GC/chlamydia, RPR and HIV last month.  She gets a cold sore every year around this time. She does not take Rx medicine for this. Uses otc cream which helps.   Review of Systems  Constitutional: Negative for chills, diaphoresis, fatigue and fever.  Cardiovascular: Negative for chest pain and palpitations.  Gastrointestinal: Negative for abdominal pain.  Genitourinary: Positive for dyspareunia, dysuria and vaginal discharge. Negative for flank pain, frequency, hematuria, pelvic pain and urgency.  Musculoskeletal: Negative for back pain.    There are no active problems to display for this patient.   Current Outpatient Medications on File Prior to Visit  Medication Sig Dispense Refill  . Diclofenac Potassium 50 MG PACK Take 50 mg by mouth once as needed. For migraine. 9 each 11  . ibuprofen (ADVIL) 200 MG tablet Take 200 mg by mouth every 6 (six) hours as needed.    . Norgestimate-Ethinyl Estradiol Triphasic 0.18/0.215/0.25 MG-25 MCG tab Take 1 tablet by mouth daily. 1 Package 11  . ondansetron (ZOFRAN-ODT) 4 MG disintegrating tablet Take 1 tablet (4 mg total) by mouth every 8 (eight) hours as needed for nausea. 30 tablet 3  . SUMAtriptan (IMITREX) 50 MG tablet Take 1 tablet (50 mg total) by mouth every 2 (two) hours as needed for migraine. May repeat in 2 hours. Max of 2 tabs per day. 10 tablet 5  . fluconazole (DIFLUCAN) 150 MG tablet Take 1 tablet on day 1, 1 on day 3 and 1 on day 7. (Patient not taking: Reported on 01/28/2017) 3 tablet 0   No current  facility-administered medications on file prior to visit.     No Known Allergies   Objective:  BP 115/75 (BP Location: Right Arm, Patient Position: Sitting, Cuff Size: Normal)   Pulse 85   Temp 99.4 F (37.4 C) (Oral)   Resp 16   Ht 5' 1.75" (1.568 m)   Wt 113 lb (51.3 kg)   SpO2 95%   BMI 20.84 kg/m   Physical Exam  Constitutional: She is oriented to person, place, and time and well-developed, well-nourished, and in no distress. No distress.  Genitourinary: Vagina normal, cervix normal, right adnexa normal, left adnexa normal and vulva normal. No vaginal discharge found.  Neurological: She is alert and oriented to person, place, and time. GCS score is 15.  Skin: Skin is warm and dry.  Psychiatric: Mood, memory, affect and judgment normal.  Vitals reviewed.  Results for orders placed or performed in visit on 03/09/17  POCT Wet + KOH Prep  Result Value Ref Range   Yeast by KOH Absent Absent   Yeast by wet prep Absent Absent   WBC by wet prep Moderate (A) Few   Clue Cells Wet Prep HPF POC None None   Trich by wet prep Absent Absent   Bacteria Wet Prep HPF POC None (A) Few   Epithelial Cells By Principal FinancialWet Pref (UMFC) Moderate (A) None, Few, Too numerous to count   RBC,UR,HPF,POC None None RBC/hpf  POCT urinalysis dipstick  Result Value Ref Range   Color, UA yellow yellow  Clarity, UA clear clear   Glucose, UA negative negative mg/dL   Bilirubin, UA negative negative   Ketones, POC UA negative negative mg/dL   Spec Grav, UA [id number] - 1.025   Blood, UA trace-intact (A) negative   pH, UA 7.0 5.0 - 8.0   Protein Ur, POC trace (A) negative mg/dL   Urobilinogen, UA 0.2 0.2 or 1.0 E.U./dL   Nitrite, UA Negative Negative   Leukocytes, UA Negative Negative    Assessment and Plan :  1. Subacute vaginitis 2. White vaginal discharge 3. Dysuria - POCT Wet + KOH Prep - metroNIDAZOLE (METROGEL VAGINAL) 0.75 % vaginal gel; Place 1 Applicatorful vaginally at bedtime for 5 days.   Dispense: 70 g; Refill: 0 - POCT urinalysis dipstick - Urine Culture - Negative POCT tests today. No concerning findings on PE. Urine culture is pending. Will treat symptoms with metrogel suppositories. RTC if no improvement. Stay well hydrated.  4. Herpes labialis - valACYclovir (VALTREX) 1000 MG tablet; Take 2 tablets (2,000 mg total) by mouth 2 (two) times daily. Start ASAP at symptom onset.  Dispense: 10 tablet; Refill: 0   Terri CollieWhitney Dereon Williamsen, PA-C  [facility] at [facility] 03/09/2017 3:14 PM

## 2017-03-10 LAB — URINE CULTURE: Organism ID, Bacteria: NO GROWTH

## 2017-04-03 ENCOUNTER — Encounter: Payer: Self-pay | Admitting: Family Medicine

## 2017-04-03 ENCOUNTER — Other Ambulatory Visit: Payer: Self-pay

## 2017-04-03 ENCOUNTER — Ambulatory Visit: Payer: BLUE CROSS/BLUE SHIELD | Admitting: Family Medicine

## 2017-04-03 VITALS — BP 98/60 | HR 109 | Temp 99.3°F | Resp 16 | Ht 61.75 in | Wt 113.2 lb

## 2017-04-03 DIAGNOSIS — R42 Dizziness and giddiness: Secondary | ICD-10-CM | POA: Diagnosis not present

## 2017-04-03 DIAGNOSIS — R358 Other polyuria: Secondary | ICD-10-CM

## 2017-04-03 DIAGNOSIS — R300 Dysuria: Secondary | ICD-10-CM | POA: Diagnosis not present

## 2017-04-03 DIAGNOSIS — N912 Amenorrhea, unspecified: Secondary | ICD-10-CM | POA: Diagnosis not present

## 2017-04-03 DIAGNOSIS — R3129 Other microscopic hematuria: Secondary | ICD-10-CM

## 2017-04-03 DIAGNOSIS — B3731 Acute candidiasis of vulva and vagina: Secondary | ICD-10-CM

## 2017-04-03 DIAGNOSIS — B373 Candidiasis of vulva and vagina: Secondary | ICD-10-CM

## 2017-04-03 DIAGNOSIS — R3589 Other polyuria: Secondary | ICD-10-CM

## 2017-04-03 LAB — POCT URINE PREGNANCY: Preg Test, Ur: NEGATIVE

## 2017-04-03 LAB — POCT URINALYSIS DIP (MANUAL ENTRY)
Bilirubin, UA: NEGATIVE
GLUCOSE UA: NEGATIVE mg/dL
Ketones, POC UA: NEGATIVE mg/dL
LEUKOCYTES UA: NEGATIVE
NITRITE UA: NEGATIVE
Spec Grav, UA: 1.025 (ref 1.010–1.025)
Urobilinogen, UA: 0.2 E.U./dL
pH, UA: 5.5 (ref 5.0–8.0)

## 2017-04-03 LAB — POCT CBC
GRANULOCYTE PERCENT: 72.4 % (ref 37–80)
HCT, POC: 48.2 % — AB (ref 37.7–47.9)
Hemoglobin: 15.9 g/dL (ref 12.2–16.2)
Lymph, poc: 2.5 (ref 0.6–3.4)
MCH: 27.2 pg (ref 27–31.2)
MCHC: 32.9 g/dL (ref 31.8–35.4)
MCV: 82.7 fL (ref 80–97)
MID (CBC): 0.2 (ref 0–0.9)
MPV: 7 fL (ref 0–99.8)
POC Granulocyte: 7.2 — AB (ref 2–6.9)
POC LYMPH %: 25.1 % (ref 10–50)
POC MID %: 2.5 % (ref 0–12)
Platelet Count, POC: 270 10*3/uL (ref 142–424)
RBC: 5.82 M/uL — AB (ref 4.04–5.48)
RDW, POC: 13 %
WBC: 10 10*3/uL (ref 4.6–10.2)

## 2017-04-03 LAB — POCT WET + KOH PREP: TRICH BY WET PREP: ABSENT

## 2017-04-03 MED ORDER — FLUCONAZOLE 150 MG PO TABS: ORAL_TABLET | ORAL | 0 refills | Status: DC

## 2017-04-03 NOTE — Patient Instructions (Addendum)
IF you received an x-ray today, you will receive an invoice from [facility]. Please contact [facility] at [phone] with questions or concerns regarding your invoice.   IF you received labwork today, you will receive an invoice from [facility]. Please contact [facility] at [phone] with questions or concerns regarding your invoice.   Our billing staff will not be able to assist you with questions regarding bills from these companies.  You will be contacted with the lab results as soon as they are available. The fastest way to get your results is to activate your My Chart account. Instructions are located on the last page of this paperwork. If you have not heard from us regarding the results in 2 weeks, please contact this office.     Kegel Exercises Kegel exercises help strengthen the muscles that support the rectum, vagina, small intestine, bladder, and uterus. Doing Kegel exercises can help:  Improve bladder and bowel control.  Improve sexual response.  Reduce problems and discomfort during pregnancy.  Kegel exercises involve squeezing your pelvic floor muscles, which are the same muscles you squeeze when you try to stop the flow of urine. The exercises can be done while sitting, standing, or lying down, but it is best to vary your position. Phase 1 exercises 1. Squeeze your pelvic floor muscles tight. You should feel a tight lift in your rectal area. If you are a female, you should also feel a tightness in your vaginal area. Keep your stomach, buttocks, and legs relaxed. 2. Hold the muscles tight for up to 10 seconds. 3. Relax your muscles. Repeat this exercise 50 times a day or as many times as told by your health care provider. Continue to do this exercise for at least 4-6 weeks or for as long as told by your health care provider. This information is not intended to replace advice given to you by your health care provider. Make sure you discuss any  questions you have with your health care provider. Document Released: 03/19/2012 Document Revised: 11/26/2015 Document Reviewed: 02/20/2015 Elsevier Interactive Patient Education  2018 [employer] Dysuria Dysuria is pain or discomfort while urinating. The pain or discomfort may be felt in the tube that carries urine out of the bladder (urethra) or in the surrounding tissue of the genitals. The pain may also be felt in the groin area, lower abdomen, and lower back. You may have to urinate frequently or have the sudden feeling that you have to urinate (urgency). Dysuria can affect both men and women, but is more common in women. Dysuria can be caused by many different things, including:  Urinary tract infection in women.  Infection of the kidney or bladder.  Kidney stones or bladder stones.  Certain sexually transmitted infections (STIs), such as chlamydia.  Dehydration.  Inflammation of the vagina.  Use of certain medicines.  Use of certain soaps or scented products that cause irritation.  Follow these instructions at home: Watch your dysuria for any changes. The following actions may help to reduce any discomfort you are feeling:  Drink enough fluid to keep your urine clear or pale yellow.  Empty your bladder often. Avoid holding urine for long periods of time.  After a bowel movement or urination, women should cleanse from front to back, using each tissue only once.  Empty your bladder after sexual intercourse.  Take medicines only as directed by your health care provider.  If you were prescribed an antibiotic medicine, finish it all even if you  start to feel better.  Avoid caffeine, tea, and alcohol. They can irritate the bladder and make dysuria worse. In men, alcohol may irritate the prostate.  Keep all follow-up visits as directed by your health care provider. This is important.  If you had any tests done to find the cause of dysuria, it is your responsibility to  obtain your test results. Ask the lab or department performing the test when and how you will get your results. Talk with your health care provider if you have any questions about your results.  Contact a health care provider if:  You develop pain in your back or sides.  You have a fever.  You have nausea or vomiting.  You have blood in your urine.  You are not urinating as often as you usually do. Get help right away if:  You pain is severe and not relieved with medicines.  You are unable to hold down any fluids.  You or someone else notices a change in your mental function.  You have a rapid heartbeat at rest.  You have shaking or chills.  You feel extremely weak. This information is not intended to replace advice given to you by your health care provider. Make sure you discuss any questions you have with your health care provider. Document Released: 12/30/2003 Document Revised: 09/08/2015 Document Reviewed: 11/26/2013 Elsevier Interactive Patient Education  [employer]2018 Elsevier Inc.

## 2017-04-03 NOTE — Progress Notes (Signed)
Chief Complaint  Patient presents with  . late menses, negative pregnancy test  . possible uti    HPI   Pt has a history of irregular menses and she did not get her period yet Patient's last menstrual period was 02/28/2017. She states that she is urinating more often than usual  She states that she is not drinking much fluid and is urinating more often She states that when she goes to the bathroom she has good urine production She states that she started urinating more yesterday She has a vaginal discharge but its is not clumpy   Pt has been getting nauseous for a few months  She has a history of migraines and has some nausea medications She states that once she eats the nausea goes away but the lightheadedness is still there   Past Medical History:  Diagnosis Date  . Migraine with aura   . Scoliosis     Current Outpatient Medications  Medication Sig Dispense Refill  . ibuprofen (ADVIL) 200 MG tablet Take 200 mg by mouth every 6 (six) hours as needed.    . SUMAtriptan (IMITREX) 50 MG tablet Take 1 tablet (50 mg total) by mouth every 2 (two) hours as needed for migraine. May repeat in 2 hours. Max of 2 tabs per day. 10 tablet 5  . Diclofenac Potassium 50 MG PACK Take 50 mg by mouth once as needed. For migraine. (Patient not taking: Reported on 04/03/2017) 9 each 11  . fluconazole (DIFLUCAN) 150 MG tablet Take 1 tablet on day 1, repeat on day 2 2 tablet 0  . Norgestimate-Ethinyl Estradiol Triphasic 0.18/0.215/0.25 MG-25 MCG tab Take 1 tablet by mouth daily. (Patient not taking: Reported on 04/03/2017) 1 Package 11  . ondansetron (ZOFRAN-ODT) 4 MG disintegrating tablet Take 1 tablet (4 mg total) by mouth every 8 (eight) hours as needed for nausea. (Patient not taking: Reported on 04/03/2017) 30 tablet 3  . valACYclovir (VALTREX) 1000 MG tablet Take 2 tablets (2,000 mg total) by mouth 2 (two) times daily. Start ASAP at symptom onset. (Patient not taking: Reported on 04/03/2017) 10  tablet 0   No current facility-administered medications for this visit.     Allergies: No Known Allergies  Past Surgical History:  Procedure Laterality Date  . NO PAST SURGERIES      Social History   Socioeconomic History  . Marital status: Single    Spouse name: None  . Number of children: 0  . Years of education: 6812  . Highest education level: None  Social Needs  . Financial resource strain: None  . Food insecurity - worry: None  . Food insecurity - inability: None  . Transportation needs - medical: None  . Transportation needs - non-medical: None  Occupational History  . Occupation: [employer]  Tobacco Use  . Smoking status: Never Smoker  . Smokeless tobacco: Never Used  Substance and Sexual Activity  . Alcohol use: No  . Drug use: No  . Sexual activity: Yes    Partners: Male    Comment: 07/25/16-unprotected sex 2 weeks ago  Other Topics Concern  . None  Social History Narrative   Lives at home w/ her parents   Right-handed   Caffeine: soda daily    Family History  Problem Relation Age of Onset  . Migraines Mother      ROS Review of Systems See HPI Constitution: No fevers or chills No malaise No diaphoresis Skin: No rash or itching Eyes: no blurry vision, no double vision  GU: see hpi Neuro: no dizziness or headaches all others reviewed and negative   Objective: Vitals:   04/03/17 1435  BP: 98/60  Pulse: (!) 109  Resp: 16  Temp: 99.3 F (37.4 C)  TempSrc: Oral  SpO2: 98%  Weight: 113 lb 3.2 oz (51.3 kg)  Height: 5' 1.75" (1.568 m)  Body mass index is 20.87 kg/m.   Physical Exam  Constitutional: She appears well-developed and well-nourished.  HENT:  Head: Normocephalic and atraumatic.  Eyes: Conjunctivae and EOM are normal.  Pulmonary/Chest: Effort normal.   Vaginal exam- chaperone present Labia with skin irritation  Urethral meatus edematous  Vagina with white discharge No CMT, ovaries small and not palpable Uterus midline,  nontender   Assessment and Plan Amari was seen today for late menses, negative pregnancy test and possible uti.  Diagnoses and all orders for this visit:  Dysuria- likely due to yeast vaginitis -     POCT urinalysis dipstick  Amenorrhea, unspecified- based on pelvic exam pt is beginning menses -     POCT urine pregnancy  Polyuria -     POCT Wet + KOH Prep  Hematuria, microscopic -     POCT Microscopic Urinalysis ([facility])  Lightheadedness- no findings to explain lightheadedness -     POCT CBC -     Basic metabolic panel -     TSH  Vaginal yeast infection- vaginal yeast infection likely the cause of increased urination and ithcing and skin irritation would explain hematuria  Other orders -     Cancel: POCT urinalysis dipstick -     fluconazole (DIFLUCAN) 150 MG tablet; Take 1 tablet on day 1, repeat on day 2   A total of 25 minutes were spent face-to-face with the patient during this encounter and over half of that time was spent on counseling and coordination of care.   Terri Combs A Sabien Umland

## 2017-04-04 LAB — TSH: TSH: 0.733 u[IU]/mL (ref 0.450–4.500)

## 2017-04-04 LAB — BASIC METABOLIC PANEL
BUN / CREAT RATIO: 21 (ref 9–23)
BUN: 20 mg/dL (ref 6–20)
CHLORIDE: 100 mmol/L (ref 96–106)
CO2: 20 mmol/L (ref 20–29)
Calcium: 10.5 mg/dL — ABNORMAL HIGH (ref 8.7–10.2)
Creatinine, Ser: 0.95 mg/dL (ref 0.57–1.00)
GFR calc non Af Amer: 86 mL/min/{1.73_m2} (ref >59)
GFR, EST AFRICAN AMERICAN: 99 mL/min/{1.73_m2} (ref >59)
GLUCOSE: 56 mg/dL — AB (ref 65–99)
POTASSIUM: 4.6 mmol/L (ref 3.5–5.2)
Sodium: 140 mmol/L (ref 134–144)

## 2017-04-06 ENCOUNTER — Encounter: Payer: Self-pay | Admitting: Family Medicine

## 2017-04-12 ENCOUNTER — Telehealth: Payer: Self-pay | Admitting: *Deleted

## 2017-04-12 NOTE — Telephone Encounter (Signed)
Letter sent.

## 2017-05-04 ENCOUNTER — Encounter (HOSPITAL_COMMUNITY): Payer: Self-pay | Admitting: Emergency Medicine

## 2017-05-04 ENCOUNTER — Other Ambulatory Visit: Payer: Self-pay

## 2017-05-04 ENCOUNTER — Emergency Department (HOSPITAL_COMMUNITY)
Admission: EM | Admit: 2017-05-04 | Discharge: 2017-05-04 | Disposition: A | Discharge status: Home / Self Care | Payer: BLUE CROSS/BLUE SHIELD | Attending: Emergency Medicine | Admitting: Emergency Medicine

## 2017-05-04 DIAGNOSIS — R110 Nausea: Secondary | ICD-10-CM | POA: Diagnosis not present

## 2017-05-04 DIAGNOSIS — Z79899 Other long term (current) drug therapy: Secondary | ICD-10-CM | POA: Diagnosis not present

## 2017-05-04 DIAGNOSIS — R197 Diarrhea, unspecified: Secondary | ICD-10-CM

## 2017-05-04 LAB — URINALYSIS, ROUTINE W REFLEX MICROSCOPIC
BILIRUBIN URINE: NEGATIVE
GLUCOSE, UA: NEGATIVE mg/dL
HGB URINE DIPSTICK: NEGATIVE
Ketones, ur: 5 mg/dL — AB
Leukocytes, UA: NEGATIVE
Nitrite: NEGATIVE
Protein, ur: NEGATIVE mg/dL
Specific Gravity, Urine: 1.028 (ref 1.005–1.030)
pH: 5 (ref 5.0–8.0)

## 2017-05-04 LAB — COMPREHENSIVE METABOLIC PANEL
ALBUMIN: 3.9 g/dL (ref 3.5–5.0)
ALT: 13 U/L — AB (ref 14–54)
ANION GAP: 11 (ref 5–15)
AST: 18 U/L (ref 15–41)
Alkaline Phosphatase: 54 U/L (ref 38–126)
BUN: 6 mg/dL (ref 6–20)
CALCIUM: 8.4 mg/dL — AB (ref 8.9–10.3)
CHLORIDE: 103 mmol/L (ref 101–111)
CO2: 22 mmol/L (ref 22–32)
CREATININE: 0.83 mg/dL (ref 0.44–1.00)
GFR calc Af Amer: >60 mL/min (ref >60)
GFR calc non Af Amer: >60 mL/min (ref >60)
GLUCOSE: 93 mg/dL (ref 65–99)
Potassium: 3.5 mmol/L (ref 3.5–5.1)
SODIUM: 136 mmol/L (ref 135–145)
Total Bilirubin: 0.9 mg/dL (ref 0.3–1.2)
Total Protein: 7 g/dL (ref 6.5–8.1)

## 2017-05-04 LAB — C DIFFICILE QUICK SCREEN W PCR REFLEX
C Diff antigen: NEGATIVE
C Diff interpretation: NOT DETECTED
C Diff toxin: NEGATIVE

## 2017-05-04 LAB — CBC
HCT: 40.8 % (ref 36.0–46.0)
HEMOGLOBIN: 13.5 g/dL (ref 12.0–15.0)
MCH: 27.3 pg (ref 26.0–34.0)
MCHC: 33.1 g/dL (ref 30.0–36.0)
MCV: 82.6 fL (ref 78.0–100.0)
Platelets: 226 10*3/uL (ref 150–400)
RBC: 4.94 MIL/uL (ref 3.87–5.11)
RDW: 12.4 % (ref 11.5–15.5)
WBC: 7.8 10*3/uL (ref 4.0–10.5)

## 2017-05-04 LAB — I-STAT BETA HCG BLOOD, ED (MC, WL, AP ONLY)

## 2017-05-04 LAB — LIPASE, BLOOD: Lipase: 22 U/L (ref 11–51)

## 2017-05-04 MED ORDER — ONDANSETRON HCL 4 MG PO TABS: 4.0000 mg | ORAL_TABLET | Freq: Four times a day (QID) | ORAL | 0 refills | Status: DC

## 2017-05-04 MED ORDER — FAMOTIDINE 20 MG PO TABS: 20.0000 mg | ORAL_TABLET | Freq: Two times a day (BID) | ORAL | 0 refills | Status: DC

## 2017-05-04 MED ORDER — FAMOTIDINE IN NACL 20-0.9 MG/50ML-% IV SOLN
20.0000 mg | Freq: Once | INTRAVENOUS | Status: AC
  Administered 2017-05-04: 20 mg via INTRAVENOUS
  Filled 2017-05-04: qty 50

## 2017-05-04 MED ORDER — SODIUM CHLORIDE 0.9 % IV BOLUS (SEPSIS)
500.0000 mL | Freq: Once | INTRAVENOUS | Status: AC
  Administered 2017-05-04: 500 mL via INTRAVENOUS

## 2017-05-04 MED ORDER — SODIUM CHLORIDE 0.9 % IV BOLUS (SEPSIS): 1000.0000 mL | Freq: Once | INTRAVENOUS | Status: DC

## 2017-05-04 MED ORDER — ONDANSETRON HCL 4 MG/2ML IJ SOLN
4.0000 mg | Freq: Once | INTRAMUSCULAR | Status: AC
Start: 2017-05-04 — End: 2017-05-04
  Administered 2017-05-04: 4 mg via INTRAVENOUS
  Filled 2017-05-04: qty 2

## 2017-05-04 NOTE — ED Triage Notes (Signed)
Pt c/o nausea and watery stools x 3-4 days after eating beef. Pt c/o dizziness and heartburn.

## 2017-05-04 NOTE — ED Notes (Signed)
Pt tolerating po fluids well, reports feeling better, ambulated to the hallway without any difficulty. Family at bedside.

## 2017-05-04 NOTE — ED Provider Notes (Signed)
[facility] EMERGENCY DEPARTMENT Provider Note   CSN: [id number] Arrival date & time: 05/04/17  [id number]     History   Chief Complaint Chief Complaint  Patient presents with  . Nausea    HPI Terri SlimSan Wessling is a 22 y.o. female with history of migraines, scoliosis who presents with a 5-day history of nausea, diarrhea, and intermittent abdominal cramping.  Patient reports 5 days ago she had some questionable beef.  The day of and after patient had waves of nausea, but 2 days following, she began having 4-5 episodes of watery diarrhea a day.  She continues to have that.  She has not had any vomiting, but continues to have nausea.  She has had intermittent abdominal cramping before and after diarrhea episodes.  Her diarrhea is nonbloody.  She denies any abnormal vaginal bleeding or discharge, urinary symptoms.  She has had some associated heartburn and intermittent sharp chest pains associated.  She has also had some lightheadedness over the past few days.  She denies any recent long trips, surgeries, new leg pain or swelling, history of blood clots, exogenous estrogen use, known cancer.  She denies any recent travel out of the country or known sick contacts.  She denies any recent antibiotic use.  HPI  Past Medical History:  Diagnosis Date  . Migraine with aura   . Scoliosis     There are no active problems to display for this patient.   Past Surgical History:  Procedure Laterality Date  . NO PAST SURGERIES      OB History    No data available       Home Medications    Prior to Admission medications   Medication Sig Start Date End Date Taking? Authorizing Provider  Camphor-Eucalyptus-Menthol (VICKS VAPORUB EX) Apply 1 application topically as needed (Congestion).   Yes [provider]  hydrocortisone 2.5 % cream Apply 1 application topically 2 (two) times daily as needed for rash or itching. 03/19/17  Yes [provider]  Diclofenac Potassium 50 MG  PACK Take 50 mg by mouth once as needed. For migraine. 01/09/17   Anson FretAhern, Antonia B, MD  famotidine (PEPCID) 20 MG tablet Take 1 tablet (20 mg total) by mouth 2 (two) times daily. 05/04/17   Madeline Pho, Waylan BogaAlexandra M, PA-C  fluconazole (DIFLUCAN) 150 MG tablet Take 1 tablet on day 1, repeat on day 2 Patient not taking: Reported on 05/04/2017 04/03/17   Doristine BosworthStallings, Zoe A, MD  Norgestimate-Ethinyl Estradiol Triphasic 0.18/0.215/0.25 MG-25 MCG tab Take 1 tablet by mouth daily. Patient not taking: Reported on 04/03/2017 01/28/17   Ofilia Neaslark, Michael L, PA-C  ondansetron (ZOFRAN) 4 MG tablet Take 1 tablet (4 mg total) by mouth every 6 (six) hours. 05/04/17   Taja Pentland, Waylan BogaAlexandra M, PA-C  ondansetron (ZOFRAN-ODT) 4 MG disintegrating tablet Take 1 tablet (4 mg total) by mouth every 8 (eight) hours as needed for nausea. 07/02/16   Anson FretAhern, Antonia B, MD  SUMAtriptan (IMITREX) 50 MG tablet Take 1 tablet (50 mg total) by mouth every 2 (two) hours as needed for migraine. May repeat in 2 hours. Max of 2 tabs per day. 11/15/16   Anson FretAhern, Antonia B, MD  valACYclovir (VALTREX) 1000 MG tablet Take 2 tablets (2,000 mg total) by mouth 2 (two) times daily. Start ASAP at symptom onset. Patient not taking: Reported on 04/03/2017 03/09/17   McVey, Madelaine BhatElizabeth Whitney, PA-C    Family History Family History  Problem Relation Age of Onset  . Migraines Mother  Social History Social History   Tobacco Use  . Smoking status: Never Smoker  . Smokeless tobacco: Never Used  Substance Use Topics  . Alcohol use: No  . Drug use: No     Allergies   Patient has no known allergies.   Review of Systems Review of Systems  Constitutional: Negative for chills and fever.  HENT: Negative for facial swelling and sore throat.   Respiratory: Negative for shortness of breath.   Cardiovascular: Positive for chest pain.  Gastrointestinal: Positive for abdominal pain, diarrhea and nausea. Negative for blood in stool and vomiting.  Genitourinary: Negative  for dysuria.  Musculoskeletal: Negative for back pain.  Skin: Negative for rash and wound.  Neurological: Positive for light-headedness. Negative for headaches.  Psychiatric/Behavioral: The patient is not nervous/anxious.      Physical Exam Updated Vital Signs BP (!) 112/92   Pulse 75   Temp 98.1 F (36.7 C) (Oral)   Resp 20   Ht 5\' 2"  (1.575 m)   Wt 52.2 kg (115 lb)   LMP 04/22/2017   SpO2 100%   BMI 21.03 kg/m   Physical Exam  Constitutional: She appears well-developed and well-nourished. No distress.  HENT:  Head: Normocephalic and atraumatic.  Mouth/Throat: Oropharynx is clear and moist. No oropharyngeal exudate.  Eyes: Conjunctivae are normal. Pupils are equal, round, and reactive to light. Right eye exhibits no discharge. Left eye exhibits no discharge. No scleral icterus.  Neck: Normal range of motion. Neck supple. No thyromegaly present.  Cardiovascular: Normal rate, regular rhythm, normal heart sounds and intact distal pulses. Exam reveals no gallop and no friction rub.  No murmur heard. Pulmonary/Chest: Effort normal and breath sounds normal. No stridor. No respiratory distress. She has no wheezes. She has no rales.  Abdominal: Soft. Bowel sounds are normal. She exhibits no distension. Tenderness: "discomfort" There is no rebound, no guarding and no CVA tenderness.  Musculoskeletal: She exhibits no edema.  Lymphadenopathy:    She has no cervical adenopathy.  Neurological: She is alert. Coordination normal.  Skin: Skin is warm and dry. No rash noted. She is not diaphoretic. No pallor.  Psychiatric: She has a normal mood and affect.  Nursing note and vitals reviewed.    ED Treatments / Results  Labs (all labs ordered are listed, but only abnormal results are displayed) Labs Reviewed  COMPREHENSIVE METABOLIC PANEL - Abnormal; Notable for the following components:      Result Value   Calcium 8.4 (*)    ALT 13 (*)    All other components within normal limits    URINALYSIS, ROUTINE W REFLEX MICROSCOPIC - Abnormal; Notable for the following components:   APPearance HAZY (*)    Ketones, ur 5 (*)    All other components within normal limits  GASTROINTESTINAL PANEL BY PCR, STOOL (REPLACES STOOL CULTURE)  C DIFFICILE QUICK SCREEN W PCR REFLEX  LIPASE, BLOOD  CBC  I-STAT BETA HCG BLOOD, ED ([facility], [facility], [facility] ONLY)    EKG  EKG Interpretation None       Radiology No results found.  Procedures Procedures (including critical care time)  Medications Ordered in ED Medications  famotidine (PEPCID) IVPB 20 mg premix (0 mg Intravenous Stopped 05/04/17 0852)  ondansetron (ZOFRAN) injection 4 mg (4 mg Intravenous Given 05/04/17 0822)  sodium chloride 0.9 % bolus 500 mL (0 mLs Intravenous Stopped 05/04/17 0939)     Initial Impression / Assessment and Plan / ED Course  I have reviewed the triage vital signs and  the nursing notes.  Pertinent labs & imaging results that were available during my care of the patient were reviewed by me and considered in my medical decision making (see chart for details).     Patient with presumed infectious diarrhea, possibly from the beef the patient ate.  Labs unremarkable.  Pregnancy negative.  GI panel and C. difficile pending.  Patient is well-appearing.  Abdominal exam is benign.  Orthostatics are normal except mild tachycardia with standing.  Patient given fluid bolus.  She is tolerating PO prior to discharge.  Diet discussed as symptoms are improving.  Patient advised she would be called if any positive results needing treatment.  Will discharge home with Pepcid for heartburn sensation and Zofran.  Return precautions discussed.  Patient understands and agrees with plan.  Patient vitals stable throughout ED course and discharged in satisfactory condition.  Final Clinical Impressions(s) / ED Diagnoses   Final diagnoses:  Nausea  Diarrhea of presumed infectious origin    ED Discharge Orders        Ordered     ondansetron (ZOFRAN) 4 MG tablet  Every 6 hours     05/04/17 0950    famotidine (PEPCID) 20 MG tablet  2 times daily     05/04/17 0950       Emi Holes, PA-C 05/04/17 1101    Pricilla Loveless, MD 05/05/17 [phone]

## 2017-05-04 NOTE — Discharge Instructions (Signed)
Medications: Pepcid, Zofran  Treatment: Take Pepcid twice daily for heartburn.  Take Zofran every 6 hours as needed for nausea or vomiting.  You will be called in 2-3 days if treatment is required for your diarrhea.  Make sure to drink plenty of fluids.  Begin with plan foods such as bananas, rice, applesauce toast.  Follow-up: Please return to the emergency department if you develop any new or worsening symptoms including severe, localized abdominal pain, fever over 100.4, or any other concerning symptom.

## 2017-05-04 NOTE — ED Notes (Signed)
ED Provider at bedside. 

## 2017-05-05 LAB — GASTROINTESTINAL PANEL BY PCR, STOOL (REPLACES STOOL CULTURE)

## 2017-05-15 ENCOUNTER — Encounter: Payer: Self-pay | Admitting: Physician Assistant

## 2017-05-15 ENCOUNTER — Ambulatory Visit: Payer: BLUE CROSS/BLUE SHIELD | Admitting: Physician Assistant

## 2017-05-15 VITALS — BP 122/72 | HR 99 | Temp 97.9°F | Resp 17 | Ht 61.5 in | Wt 114.0 lb

## 2017-05-15 DIAGNOSIS — N898 Other specified noninflammatory disorders of vagina: Secondary | ICD-10-CM

## 2017-05-15 DIAGNOSIS — Z7251 High risk heterosexual behavior: Secondary | ICD-10-CM | POA: Diagnosis not present

## 2017-05-15 DIAGNOSIS — H00013 Hordeolum externum right eye, unspecified eyelid: Secondary | ICD-10-CM | POA: Diagnosis not present

## 2017-05-15 DIAGNOSIS — B379 Candidiasis, unspecified: Secondary | ICD-10-CM

## 2017-05-15 LAB — POCT WET + KOH PREP: Trich by wet prep: ABSENT

## 2017-05-15 MED ORDER — ERYTHROMYCIN 5 MG/GM OP OINT: 1.0000 "application " | TOPICAL_OINTMENT | Freq: Four times a day (QID) | OPHTHALMIC | 0 refills | Status: DC

## 2017-05-15 MED ORDER — FLUCONAZOLE 150 MG PO TABS: 150.0000 mg | ORAL_TABLET | Freq: Once | ORAL | 0 refills | Status: AC

## 2017-05-15 NOTE — Progress Notes (Signed)
[facility] AT [facility] [address], [place] [phone]  Date:  05/15/2017   Name:  Terri Combs Frech   DOB:  09/19/1995   MRN:  [id number]  PCP:  Patient, No Pcp Per    History of Present Illness:  Terri Combs Terri Combs is a 22 y.o. female patient who presents to PCP with  Chief Complaint  Patient presents with  . swelling around right eye     Right eye swelling.  Right eye.  No fever.  No drainage.  No pain with eye movement.  She noted for several days.  No vision changes. Patient is also here for abnormal vaginal discharge.  No hematuria, dysuria, or frequency. No abdominal pain  There are no active problems to display for this patient.   Past Medical History:  Diagnosis Date  . Migraine with aura   . Scoliosis     Past Surgical History:  Procedure Laterality Date  . NO PAST SURGERIES      Social History   Tobacco Use  . Smoking status: Never Smoker  . Smokeless tobacco: Never Used  Substance Use Topics  . Alcohol use: No  . Drug use: No    Family History  Problem Relation Age of Onset  . Migraines Mother     No Known Allergies  Medication list has been reviewed and updated.  Current Outpatient Medications on File Prior to Visit  Medication Sig Dispense Refill  . Camphor-Eucalyptus-Menthol (VICKS VAPORUB EX) Apply 1 application topically as needed (Congestion).    . Diclofenac Potassium 50 MG PACK Take 50 mg by mouth once as needed. For migraine. 9 each 11  . famotidine (PEPCID) 20 MG tablet Take 1 tablet (20 mg total) by mouth 2 (two) times daily. 30 tablet 0  . hydrocortisone 2.5 % cream Apply 1 application topically 2 (two) times daily as needed for rash or itching.  2  . ondansetron (ZOFRAN-ODT) 4 MG disintegrating tablet Take 1 tablet (4 mg total) by mouth every 8 (eight) hours as needed for nausea. 30 tablet 3  . SUMAtriptan (IMITREX) 50 MG tablet Take 1 tablet (50 mg total) by mouth every 2 (two) hours as needed for migraine. May repeat in 2 hours. Max  of 2 tabs per day. 10 tablet 5  . valACYclovir (VALTREX) 1000 MG tablet Take 2 tablets (2,000 mg total) by mouth 2 (two) times daily. Start ASAP at symptom onset. 10 tablet 0   No current facility-administered medications on file prior to visit.     ROS ROS otherwise unremarkable unless listed above.  Physical Examination: BP 122/72   Pulse 99   Temp 97.9 F (36.6 C) (Oral)   Resp 17   Ht 5' 1.5" (1.562 m)   Wt 114 lb (51.7 kg)   LMP 04/22/2017   SpO2 98%   BMI 21.19 kg/m  Ideal Body Weight: Weight in (lb) to have BMI = 25: 134.2  Physical Exam  Constitutional: She is oriented to person, place, and time. She appears well-developed and well-nourished. No distress.  HENT:  Head: Normocephalic and atraumatic.  Right Ear: External ear normal.  Left Ear: External ear normal.  Eyes: Conjunctivae and EOM are normal. Pupils are equal, round, and reactive to light.  Cardiovascular: Normal rate.  Pulmonary/Chest: Effort normal. No respiratory distress.  Genitourinary: Pelvic exam was performed with patient supine. Cervix exhibits discharge. Cervix exhibits no motion tenderness and no friability. Vaginal discharge found.  Neurological: She is alert and oriented to person, place, and  time.  Skin: She is not diaphoretic.  Psychiatric: She has a normal mood and affect. Her behavior is normal.    Results for orders placed or performed in visit on 05/15/17  POCT Wet + KOH Prep  Result Value Ref Range   Yeast by KOH Present (A) Absent   Yeast by wet prep Present (A) Absent   WBC by wet prep Moderate (A) Few   Clue Cells Wet Prep HPF POC None None   Trich by wet prep Absent Absent   Bacteria Wet Prep HPF POC Many (A) Few   Epithelial Cells By [employer] Pref ([facility]) Few None, Few, Too numerous to count   RBC,UR,HPF,POC None None RBC/hpf    Assessment and Plan: [name] is a 22 y.o. female who is here today for cc of  Chief Complaint  Patient presents with  . swelling around right eye    Yeast infection - Plan: fluconazole (DIFLUCAN) 150 MG tablet  Hordeolum externum of right eye, unspecified eyelid - Plan: erythromycin (ROMYCIN) ophthalmic ointment  Vaginal discharge - Plan: POCT Wet + KOH Prep  Unprotected sex - Plan: GC/Chlamydia Probe Amp  Trena Platt, PA-C [facility] 2/8/20191:02 PM

## 2017-05-15 NOTE — Patient Instructions (Addendum)
Please take only one pill for the yeast infection.  You may take a second dose after 1 week.  Vaginal Yeast infection, Adult Vaginal yeast infection is a condition that causes soreness, swelling, and redness (inflammation) of the vagina. It also causes vaginal discharge. This is a common condition. Some women get this infection frequently. What are the causes? This condition is caused by a change in the normal balance of the yeast (candida) and bacteria that live in the vagina. This change causes an overgrowth of yeast, which causes the inflammation. What increases the risk? This condition is more likely to develop in:  Women who take antibiotic medicines.  Women who have diabetes.  Women who take birth control pills.  Women who are pregnant.  Women who douche often.  Women who have a weak defense (immune) system.  Women who have been taking steroid medicines for a long time.  Women who frequently wear tight clothing.  What are the signs or symptoms? Symptoms of this condition include:  White, thick vaginal discharge.  Swelling, itching, redness, and irritation of the vagina. The lips of the vagina (vulva) may be affected as well.  Pain or a burning feeling while urinating.  Pain during sex.  How is this diagnosed? This condition is diagnosed with a medical history and physical exam. This will include a pelvic exam. Your health care provider will examine a sample of your vaginal discharge under a microscope. Your health care provider may send this sample for testing to confirm the diagnosis. How is this treated? This condition is treated with medicine. Medicines may be over-the-counter or prescription. You may be told to use one or more of the following:  Medicine that is taken orally.  Medicine that is applied as a cream.  Medicine that is inserted directly into the vagina (suppository).  Follow these instructions at home:  Take or apply over-the-counter and  prescription medicines only as told by your health care provider.  Do not have sex until your health care provider has approved. Tell your sex partner that you have a yeast infection. That person should go to his or her health care provider if he or she develops symptoms.  Do not wear tight clothes, such as pantyhose or tight pants.  Avoid using tampons until your health care provider approves.  Eat more yogurt. This may help to keep your yeast infection from returning.  Try taking a sitz bath to help with discomfort. This is a warm water bath that is taken while you are sitting down. The water should only come up to your hips and should cover your buttocks. Do this 3-4 times per day or as told by your health care provider.  Do not douche.  Wear breathable, cotton underwear.  If you have diabetes, keep your blood sugar levels under control. Contact a health care provider if:  You have a fever.  Your symptoms go away and then return.  Your symptoms do not get better with treatment.  Your symptoms get worse.  You have new symptoms.  You develop blisters in or around your vagina.  You have blood coming from your vagina and it is not your menstrual period.  You develop pain in your abdomen. This information is not intended to replace advice given to you by your health care provider. Make sure you discuss any questions you have with your health care provider. Document Released: 01/10/2005 Document Revised: 09/14/2015 Document Reviewed: 10/04/2014 Elsevier Interactive Patient Education  2018 [employer]  Stye A stye is a bump on your eyelid caused by a bacterial infection. A stye can form inside the eyelid (internal stye) or outside the eyelid (external stye). An internal stye may be caused by an infected oil-producing gland inside your eyelid. An external stye may be caused by an infection at the base of your eyelash (hair follicle). Styes are very common. Anyone can get them at  any age. They usually occur in just one eye, but you may have more than one in either eye. What are the causes? The infection is almost always caused by bacteria called Staphylococcus aureus. This is a common type of bacteria that lives on your skin. What increases the risk? You may be at higher risk for a stye if you have had one before. You may also be at higher risk if you have:  Diabetes.  Long-term illness.  Long-term eye redness.  A skin condition called seborrhea.  High fat levels in your blood (lipids).  What are the signs or symptoms? Eyelid pain is the most common symptom of a stye. Internal styes are more painful than external styes. Other signs and symptoms may include:  Painful swelling of your eyelid.  A scratchy feeling in your eye.  Tearing and redness of your eye.  Pus draining from the stye.  How is this diagnosed? Your health care provider may be able to diagnose a stye just by examining your eye. The health care provider may also check to make sure:  You do not have a fever or other signs of a more serious infection.  The infection has not spread to other parts of your eye or areas around your eye.  How is this treated? Most styes will clear up in a few days without treatment. In some cases, you may need to use antibiotic drops or ointment to prevent infection. Your health care provider may have to drain the stye surgically if your stye is:  Large.  Causing a lot of pain.  Interfering with your vision.  This can be done using a thin blade or a needle. Follow these instructions at home:  Take medicines only as directed by your health care provider.  Apply a clean, warm compress to your eye for 10 minutes, 4 times a day.  Do not wear contact lenses or eye makeup until your stye has healed.  Do not try to pop or drain the stye. Contact a health care provider if:  You have chills or a fever.  Your stye does not go away after several  days.  Your stye affects your vision.  Your eyeball becomes swollen, red, or painful. This information is not intended to replace advice given to you by your health care provider. Make sure you discuss any questions you have with your health care provider. Document Released: 01/10/2005 Document Revised: 11/27/2015 Document Reviewed: 07/17/2013 Elsevier Interactive Patient Education  2018 [employer].    IF you received an x-ray today, you will receive an invoice from [facility]. Please contact [facility] at [phone] with questions or concerns regarding your invoice.   IF you received labwork today, you will receive an invoice from [facility]. Please contact [facility] at [phone] with questions or concerns regarding your invoice.   Our billing staff will not be able to assist you with questions regarding bills from these companies.  You will be contacted with the lab results as soon as they are available. The fastest way to get your results is to activate your  My Chart account. Instructions are located on the last page of this paperwork. If you have not heard from [place] regarding the results in 2 weeks, please contact this office.

## 2017-05-16 LAB — GC/CHLAMYDIA PROBE AMP
Chlamydia trachomatis, NAA: NEGATIVE
Neisseria gonorrhoeae by PCR: NEGATIVE

## 2017-07-09 ENCOUNTER — Ambulatory Visit (HOSPITAL_COMMUNITY)
Admission: EM | Admit: 2017-07-09 | Discharge: 2017-07-09 | Disposition: A | Discharge status: Home / Self Care | Payer: BLUE CROSS/BLUE SHIELD | Attending: Family Medicine | Admitting: Family Medicine

## 2017-07-09 ENCOUNTER — Encounter (HOSPITAL_COMMUNITY): Payer: Self-pay | Admitting: Family Medicine

## 2017-07-09 DIAGNOSIS — Z3202 Encounter for pregnancy test, result negative: Secondary | ICD-10-CM

## 2017-07-09 DIAGNOSIS — R42 Dizziness and giddiness: Secondary | ICD-10-CM | POA: Diagnosis not present

## 2017-07-09 DIAGNOSIS — R110 Nausea: Secondary | ICD-10-CM

## 2017-07-09 LAB — POCT URINALYSIS DIP (DEVICE)
Bilirubin Urine: NEGATIVE
GLUCOSE, UA: NEGATIVE mg/dL
KETONES UR: NEGATIVE mg/dL
LEUKOCYTES UA: NEGATIVE
Nitrite: NEGATIVE
Protein, ur: NEGATIVE mg/dL
SPECIFIC GRAVITY, URINE: 1.025 (ref 1.005–1.030)
UROBILINOGEN UA: 0.2 mg/dL (ref 0.0–1.0)
pH: 6.5 (ref 5.0–8.0)

## 2017-07-09 LAB — POCT I-STAT, CHEM 8
BUN: 14 mg/dL (ref 6–20)
CHLORIDE: 105 mmol/L (ref 101–111)
CREATININE: 0.8 mg/dL (ref 0.44–1.00)
Calcium, Ion: 1.19 mmol/L (ref 1.15–1.40)
Glucose, Bld: 78 mg/dL (ref 65–99)
HEMATOCRIT: 45 % (ref 36.0–46.0)
Hemoglobin: 15.3 g/dL — ABNORMAL HIGH (ref 12.0–15.0)
POTASSIUM: 4 mmol/L (ref 3.5–5.1)
SODIUM: 140 mmol/L (ref 135–145)
TCO2: 28 mmol/L (ref 22–32)

## 2017-07-09 LAB — POCT PREGNANCY, URINE: Preg Test, Ur: NEGATIVE

## 2017-07-09 MED ORDER — ESOMEPRAZOLE MAGNESIUM 20 MG PO CPDR: 20.0000 mg | DELAYED_RELEASE_CAPSULE | Freq: Every day | ORAL | 0 refills | Status: DC

## 2017-07-09 NOTE — Discharge Instructions (Signed)
Blood work normal. Urine negative for pregnancy or urinary tract infection. I will start you on some acid reflux medicine for the next 2 weeks to see if your symptoms are related to acid reflux. Do not eat or drink few hours before sleeping. Establish PCP and GYN care for further evaluation and management if symptoms not improving. Go to the emergency department if experiencing worsening symptoms, passing out, confusion/altered mental status.   Follow up with GYN for irregular cycles and recurrent yeast infections.

## 2017-07-09 NOTE — ED Triage Notes (Signed)
Pt here for nausea and light headedness upon waking in the morning for a month. sts also recurrent yeast infections.

## 2017-07-09 NOTE — ED Provider Notes (Signed)
[facility]    CSN: [id number] Arrival date & time: 07/09/17  1335     History   Chief Complaint Chief Complaint  Patient presents with  . Nausea    HPI Terri Combs is a 22 y.o. female.   22 year old female comes in for 1 month history of nausea and lightheadedness in the morning. States she wakes up with nausea and lightheadedness that lasts for 1-2 hours and resolves on own. She denies vomiting, abdominal pain. She has been alternating between constipation and diarrhea. Denies syncope. She endorses occasional dizziness where she feels the room spinning, but more associates that with migraine, for which she takes sumatriptan for. She denies any aggravating or alleviating factor to her symptoms. Denies association with food. Does experience some burning sensation at times up the chest.  Denies fever, chills, night sweats. Has had some rhinorrhea, but without other URI symptoms. Denies urinary symptoms such as frequency, dyusira, hematuria.  LMP 05/31/2017, having irregular cycles. Denies alcohol use, illicit drug use, tobacco use.      Past Medical History:  Diagnosis Date  . Migraine with aura   . Scoliosis     There are no active problems to display for this patient.   Past Surgical History:  Procedure Laterality Date  . NO PAST SURGERIES      OB History   None      Home Medications    Prior to Admission medications   Medication Sig Start Date End Date Taking? Authorizing Provider  Camphor-Eucalyptus-Menthol (VICKS VAPORUB EX) Apply 1 application topically as needed (Congestion).    [provider]  Diclofenac Potassium 50 MG PACK Take 50 mg by mouth once as needed. For migraine. 01/09/17   Anson Fret, MD  erythromycin [facility]) ophthalmic ointment Place 1 application into the right eye 4 (four) times daily. 05/15/17   Trena Platt D, PA  esomeprazole (NEXIUM) 20 MG capsule Take 1 capsule (20 mg total) by mouth daily for 14 days. 07/09/17  07/23/17  Belinda Fisher, PA-C  famotidine (PEPCID) 20 MG tablet Take 1 tablet (20 mg total) by mouth 2 (two) times daily. 05/04/17   Law, Waylan Boga, PA-C  hydrocortisone 2.5 % cream Apply 1 application topically 2 (two) times daily as needed for rash or itching. 03/19/17   [provider]  ondansetron (ZOFRAN-ODT) 4 MG disintegrating tablet Take 1 tablet (4 mg total) by mouth every 8 (eight) hours as needed for nausea. 07/02/16   Anson Fret, MD  SUMAtriptan (IMITREX) 50 MG tablet Take 1 tablet (50 mg total) by mouth every 2 (two) hours as needed for migraine. May repeat in 2 hours. Max of 2 tabs per day. 11/15/16   Anson Fret, MD  valACYclovir (VALTREX) 1000 MG tablet Take 2 tablets (2,000 mg total) by mouth 2 (two) times daily. Start ASAP at symptom onset. 03/09/17   McVey, Madelaine Bhat, PA-C    Family History Family History  Problem Relation Age of Onset  . Migraines Mother     Social History Social History   Tobacco Use  . Smoking status: Never Smoker  . Smokeless tobacco: Never Used  Substance Use Topics  . Alcohol use: No  . Drug use: No     Allergies   Patient has no known allergies.   Review of Systems Review of Systems  Reason unable to perform ROS: See HPI as above.     Physical Exam Triage Vital Signs ED Triage Vitals  Enc  Vitals Group     BP 07/09/17 1355 (!) 123/59     Pulse Rate 07/09/17 1355 83     Resp 07/09/17 1355 18     Temp 07/09/17 1355 98.6 F (37 C)     Temp src --      SpO2 07/09/17 1355 100 %     Weight --      Height --      Head Circumference --      Peak Flow --      Pain Score 07/09/17 1354 0     Pain Loc --      Pain Edu? --      Excl. in GC? --    No data found.  Updated Vital Signs BP (!) 123/59   Pulse 83   Temp 98.6 F (37 C)   Resp 18   LMP 05/31/2017   SpO2 100%   Physical Exam  Constitutional: She is oriented to person, place, and time. She appears well-developed and well-nourished. No distress.    HENT:  Head: Normocephalic and atraumatic.  Eyes: Pupils are equal, round, and reactive to light. Conjunctivae and EOM are normal.  Neck: Normal range of motion. Neck supple.  Cardiovascular: Normal rate, regular rhythm and normal heart sounds. Exam reveals no gallop and no friction rub.  No murmur heard. Pulmonary/Chest: Effort normal and breath sounds normal. She has no wheezes. She has no rales.  Abdominal: Soft. Bowel sounds are normal. She exhibits no mass. There is no tenderness. There is no rebound, no guarding and no CVA tenderness.  Neurological: She is alert and oriented to person, place, and time. She has normal strength. She is not disoriented. No cranial nerve deficit or sensory deficit. Coordination and gait normal. GCS eye subscore is 4. GCS verbal subscore is 5. GCS motor subscore is 6.  Skin: Skin is warm and dry.  Psychiatric: She has a normal mood and affect. Her behavior is normal. Judgment normal.     UC Treatments / Results  Labs (all labs ordered are listed, but only abnormal results are displayed) Labs Reviewed  POCT URINALYSIS DIP (DEVICE) - Abnormal; Notable for the following components:      Result Value   Hgb urine dipstick TRACE (*)    All other components within normal limits  POCT I-STAT, CHEM 8 - Abnormal; Notable for the following components:   Hemoglobin 15.3 (*)    All other components within normal limits  POCT PREGNANCY, URINE    EKG None Radiology No results found.  Procedures Procedures (including critical care time)  Medications Ordered in UC Medications - No data to display   Initial Impression / Assessment and Plan / UC Course  I have reviewed the triage vital signs and the nursing notes.  Pertinent labs & imaging results that were available during my care of the patient were reviewed by me and considered in my medical decision making (see chart for details).    No alarming signs on exam. istat without electrolyte  abnormality/anemia. Will try PPI for possible GERD causing symptoms in the morning. Push fluids. Will have patient follow up with PCP for further workup if symptoms not improving. Patient also with irregular cycles, will have patient follow up with GYN for further evaluation needed. Return precautions given.   Final Clinical Impressions(s) / UC Diagnoses   Final diagnoses:  Nausea  Intermittent lightheadedness    ED Discharge Orders        Ordered    esomeprazole (  NEXIUM) 20 MG capsule  Daily     07/09/17 1531       Belinda FisherYu, Luisangel Wainright V, PA-C 07/09/17 1747

## 2017-07-16 ENCOUNTER — Encounter: Payer: Self-pay | Admitting: Physician Assistant

## 2017-08-26 ENCOUNTER — Encounter: Payer: Self-pay | Admitting: Neurology

## 2017-10-23 ENCOUNTER — Other Ambulatory Visit (HOSPITAL_COMMUNITY)
Admission: RE | Admit: 2017-10-23 | Discharge: 2017-10-23 | Disposition: A | Discharge status: Home / Self Care | Payer: BLUE CROSS/BLUE SHIELD | Source: Ambulatory Visit | Attending: Obstetrics & Gynecology | Admitting: Obstetrics & Gynecology

## 2017-10-23 ENCOUNTER — Encounter: Payer: Self-pay | Admitting: Obstetrics & Gynecology

## 2017-10-23 ENCOUNTER — Ambulatory Visit (INDEPENDENT_AMBULATORY_CARE_PROVIDER_SITE_OTHER): Payer: BLUE CROSS/BLUE SHIELD | Admitting: Obstetrics & Gynecology

## 2017-10-23 DIAGNOSIS — Z23 Encounter for immunization: Secondary | ICD-10-CM

## 2017-10-23 DIAGNOSIS — N760 Acute vaginitis: Secondary | ICD-10-CM

## 2017-10-23 DIAGNOSIS — B373 Candidiasis of vulva and vagina: Secondary | ICD-10-CM | POA: Insufficient documentation

## 2017-10-23 MED ORDER — TERCONAZOLE 0.8 % VA CREA: 1.0000 | TOPICAL_CREAM | Freq: Every day | VAGINAL | 2 refills | Status: DC

## 2017-10-23 MED ORDER — BORIC ACID CRYS: 600.0000 mg | CRYSTALS | Freq: Every day | 10 refills | Status: DC

## 2017-10-23 NOTE — Patient Instructions (Addendum)
HPV (Human Papillomavirus) Vaccine: What You Need to Know 1. Why get vaccinated? HPV vaccine prevents infection with human papillomavirus (HPV) types that are associated with many cancers, including:  cervical cancer in females,  vaginal and vulvar cancers in females,  anal cancer in females and males,  throat cancer in females and males, and  penile cancer in males.  In addition, HPV vaccine prevents infection with HPV types that cause genital warts in both females and males. In the [place] about 12,000 women get cervical cancer every year, and about 4,000 women die from it. HPV vaccine can prevent most of these cases of cervical cancer. Vaccination is not a substitute for cervical cancer screening. This vaccine does not protect against all HPV types that can cause cervical cancer. Women should still get regular Pap tests. HPV infection usually comes from sexual contact, and most people will become infected at some point in their life. About 14 million Americans, including teens, get infected every year. Most infections will go away on their own and not cause serious problems. But thousands of women and men get cancer and other diseases from HPV. 2. HPV vaccine HPV vaccine is approved by FDA and is recommended by CDC for both males and females. It is routinely given at 80 or 22 years of age, but it may be given beginning at age 17 years through age 9 years. Most adolescents 9 through 22 years of age should get HPV vaccine as a two-dose series with the doses separated by 6-12 months. People who start HPV vaccination at 10 years of age and older should get the vaccine as a three-dose series with the second dose given 1-2 months after the first dose and the third dose given 6 months after the first dose. There are several exceptions to these age recommendations. Your health care provider can give you more information. 3. Some people should not get this vaccine  Anyone who has had a severe  (life-threatening) allergic reaction to a dose of HPV vaccine should not get another dose.  Anyone who has a severe (life threatening) allergy to any component of HPV vaccine should not get the vaccine.  Tell your doctor if you have any severe allergies that you know of, including a severe allergy to yeast.  HPV vaccine is not recommended for pregnant women. If you learn that you were pregnant when you were vaccinated, there is no reason to expect any problems for you or your baby. Any woman who learns she was pregnant when she got HPV vaccine is encouraged to contact the manufacturer's registry for HPV vaccination during pregnancy at (424)122-5857. Women who are breastfeeding may be vaccinated.  If you have a mild illness, such as a cold, you can probably get the vaccine today. If you are moderately or severely ill, you should probably wait until you recover. Your doctor can advise you. 4. Risks of a vaccine reaction With any medicine, including vaccines, there is a chance of side effects. These are usually mild and go away on their own, but serious reactions are also possible. Most people who get HPV vaccine do not have any serious problems with it. Mild or moderate problems following HPV vaccine:  Reactions in the arm where the shot was given: ? Soreness (about 9 people in 10) ? Redness or swelling (about 1 person in 3)  Fever: ? Mild (100F) (about 1 person in 10) ? Moderate (102F) (about 1 person in 26)  Other problems: ? Headache (about 1 person  in 3) Problems that could happen after any injected vaccine:  People sometimes faint after a medical procedure, including vaccination. Sitting or lying down for about 15 minutes can help prevent fainting, and injuries caused by a fall. Tell your doctor if you feel dizzy, or have vision changes or ringing in the ears.  Some people get severe pain in the shoulder and have difficulty moving the arm where a shot was given. This happens very  rarely.  Any medication can cause a severe allergic reaction. Such reactions from a vaccine are very rare, estimated at about 1 in a million doses, and would happen within a few minutes to a few hours after the vaccination. As with any medicine, there is a very remote chance of a vaccine causing a serious injury or death. The safety of vaccines is always being monitored. For more information, visit: http://www.aguilar.org/. 5. What if there is a serious reaction? What should I look for? Look for anything that concerns you, such as signs of a severe allergic reaction, very high fever, or unusual behavior. Signs of a severe allergic reaction can include hives, swelling of the face and throat, difficulty breathing, a fast heartbeat, dizziness, and weakness. These would usually start a few minutes to a few hours after the vaccination. What should I do? If you think it is a severe allergic reaction or other emergency that can't wait, call 9-1-1 or get to the nearest hospital. Otherwise, call your doctor. Afterward, the reaction should be reported to the Vaccine Adverse Event Reporting System (VAERS). Your doctor should file this report, or you can do it yourself through the VAERS web site at www.vaers.SamedayNews.es, or by calling [phone]. VAERS does not give medical advice. 6. The National Vaccine Injury Compensation Program The [employer] Vaccine Injury Compensation Program (VICP) is a federal program that was created to compensate people who may have been injured by certain vaccines. Persons who believe they may have been injured by a vaccine can learn about the program and about filing a claim by calling 667-740-1694 or visiting the [place] website at GoldCloset.com.ee. There is a time limit to file a claim for compensation. 7. How can I learn more?  Ask your health care provider. He or she can give you the vaccine package insert or suggest other sources of information.  Call your  local or state health department.  Contact the Centers for Disease Control and Prevention (CDC): ? Call (270)630-8440 (1-800-CDC-INFO) or ? Visit CDC's website at http://sweeney-todd.com/ Vaccine Information Statement, HPV Vaccine (03/18/2015) This information is not intended to replace advice given to you by your health care provider. Make sure you discuss any questions you have with your health care provider. Document Released: 10/28/2013 Document Revised: 12/22/2015 Document Reviewed: 12/22/2015 Elsevier Interactive Patient Education  2017 Elsevier Inc.    Preventing Cervical Cancer Cervical cancer is cancer that grows on the cervix. The cervix is at the bottom of the uterus. It connects the uterus to the vagina. The uterus is where a baby develops during pregnancy. Cancer occurs when cells become abnormal and start to grow out of control. Cervical cancer grows slowly and may not cause any symptoms at first. Over time, the cancer can grow deep into the cervix tissue and spread to other areas. If it is found early, cervical cancer can be treated effectively. You can also take steps to prevent this type of cancer. Most cases of cervical cancer are caused by an STI (sexually transmitted infection) called human papillomavirus (HPV). One way  to reduce your risk of cervical cancer is to avoid infection with the HPV virus. You can do this by practicing safe sex and by getting the HPV vaccine. Getting regular Pap tests is also important because this can help identify changes in cells that could lead to cancer. Your chances of getting this disease can also be reduced by making certain lifestyle changes. How can I protect myself from cervical cancer? Preventing HPV infection  Ask your health care provider about getting the HPV vaccine. If you are 5 years old or younger, you may need to get this vaccine, which is given in three doses over 6 months. This vaccine protects against the types of HPV that could cause  cancer.  Limit the number of people you have sex with. Also avoid having sex with people who have had many sex partners.  Use a latex condom during sex. Getting Pap tests  Get Pap tests regularly, starting at age 71. Talk with your health care provider about how often you need these tests. ? Most women who are 75?22 years of age should have a Pap test every 3 years. ? Most women who are 50?22 years of age should have a Pap test in combination with an HPV test every 5 years. ? Women with a higher risk of cervical cancer, such as those with a weakened immune system or those who have been exposed to the drug diethylstilbestrol (DES), may need more frequent testing. Making other lifestyle changes  Do not use any products that contain nicotine or tobacco, such as cigarettes and e-cigarettes. If you need help quitting, ask your health care provider.  Eat at least 5 servings of fruits and vegetables every day.  Lose weight if you are overweight. Why are these changes important?  These changes and screening tests are designed to address the factors that are known to increase the risk of cervical cancer. Taking these steps is the best way to reduce your risk.  Having regular Pap tests will help identify changes in cells that could lead to cancer. Steps can then be taken to prevent cancer from developing.  These changes will also help find cervical cancer early. This type of cancer can be treated effectively if it is found early. It can be more dangerous and difficult to treat if cancer has grown deep into your cervix or has spread.  In addition to making you less likely to get cervical cancer, these changes will also provide other health benefits, such as the following: ? Practicing safe sex is important for preventing STIs and unplanned pregnancies. ? Avoiding tobacco can reduce your risk for other cancers and health issues. ? Eating a healthy diet and maintaining a healthy weight are good for  your overall health. What can happen if changes are not made? In the early stages, cervical cancer might not have any symptoms. It can take many years for the cancer to grow and get deep into the cervix tissue. This may be happening without you knowing about it. If you develop any symptoms, such as pelvic pain or unusual discharge or bleeding from your vagina, you should see your health care provider right away. If cervical cancer is not found early, you might need treatments such as radiation, chemotherapy, or surgery. In some cases, surgery may mean that you will not be able to get pregnant or carry a pregnancy to term. Where to find support: Talk with your health care provider, school nurse, or local health department for guidance about  screening and vaccination. Some children and teens may be able to get the HPV vaccine free of charge through the [place] government's Vaccines for Children [facility]) program. Other places that provide vaccinations include:  Public health clinics. Check with your local health department.  Federally [employer], where you would pay only what you can afford. To find one near you, check this website: http://weiss.info/  [facility]. These are part of a program for Medicare and Medicaid patients who live in rural areas.  The National Breast and Cervical Cancer Early Detection Program also provides breast and cervical cancer screenings and diagnostic services to low-income, uninsured, and underinsured women. Cervical cancer can be passed down through families. Talk with your health care provider or genetic counselor to learn more about genetic testing for cancer. Where to find more information: Learn more about cervical cancer from:  [employer]ne[employer]: HardChecks.be  American Cancer Society: www.cancer.org/cancer/cervicalcancer/  [place] Centers for Disease Control and Prevention:  MedicationWarning.tn  Summary  Talk with your health care provider about getting the HPV vaccine.  Be sure to get regular Pap tests as recommended by your health care provider.  See your health care provider right away if you have any pelvic pain or unusual discharge or bleeding from your vagina. This information is not intended to replace advice given to you by your health care provider. Make sure you discuss any questions you have with your health care provider. Document Released: 04/17/2015 Document Revised: 11/29/2015 Document Reviewed: 11/29/2015 Elsevier Interactive Patient Education  2018 [employer].    Vaginitis Vaginitis is a condition in which the vaginal tissue swells and becomes red (inflamed). This condition is most often caused by a change in the normal balance of bacteria and yeast that live in the vagina. This change causes an overgrowth of certain bacteria or yeast, which causes the inflammation. There are different types of vaginitis, but the most common types are:  Bacterial vaginosis.  Yeast infection (candidiasis).  Trichomoniasis vaginitis. This is a sexually transmitted disease (STD).  Viral vaginitis.  Atrophic vaginitis.  Allergic vaginitis.  What are the causes? The cause of this condition depends on the type of vaginitis. It can be caused by:  Bacteria (bacterial vaginosis).  Yeast, which is a fungus (yeast infection).  A parasite (trichomoniasis vaginitis).  A virus (viral vaginitis).  Low hormone levels (atrophic vaginitis). Low hormone levels can occur during pregnancy, breastfeeding, or after menopause.  Irritants, such as bubble baths, scented tampons, and feminine sprays (allergic vaginitis).  Other factors can change the normal balance of the yeast and bacteria that live in the vagina. These include:  Antibiotic medicines.  Poor hygiene.  Diaphragms, vaginal sponges, spermicides, birth control pills, and intrauterine  devices (IUD).  Sex.  Infection.  Uncontrolled diabetes.  A weakened defense (immune) system.  What increases the risk? This condition is more likely to develop in women who:  Smoke.  Use vaginal douches, scented tampons, or scented sanitary pads.  Wear tight-fitting pants.  Wear thong underwear.  Use oral birth control pills or an IUD.  Have sex without a condom.  Have multiple sex partners.  Have an STD.  Frequently use the spermicide nonoxynol-9.  Eat lots of foods high in sugar.  Have uncontrolled diabetes.  Have low estrogen levels.  Have a weakened immune system from an immune disorder or medical treatment.  Are pregnant or breastfeeding.  What are the signs or symptoms? Symptoms vary depending on the cause of the vaginitis. Common  symptoms include:  Abnormal vaginal discharge. ? The discharge is white, gray, or yellow with bacterial vaginosis. ? The discharge is thick, white, and cheesy with a yeast infection. ? The discharge is frothy and yellow or greenish with trichomoniasis.  A bad vaginal smell. The smell is fishy with bacterial vaginosis.  Vaginal itching, pain, or swelling.  Sex that is painful.  Pain or burning when urinating.  Sometimes there are no symptoms. How is this diagnosed? This condition is diagnosed based on your symptoms and medical history. A physical exam, including a pelvic exam, will also be done. You may also have other tests, including:  Tests to determine the pH level (acidity or alkalinity) of your vagina.  A whiff test, to assess the odor that results when a sample of your vaginal discharge is mixed with a potassium hydroxide solution.  Tests of vaginal fluid. A sample will be examined under a microscope.  How is this treated? Treatment varies depending on the type of vaginitis you have. Your treatment may include:  Antibiotic creams or pills to treat bacterial vaginosis and trichomoniasis.  Antifungal  medicines, such as vaginal creams or suppositories, to treat a yeast infection.  Medicine to ease discomfort if you have viral vaginitis. Your sexual partner should also be treated.  Estrogen delivered in a cream, pill, suppository, or vaginal ring to treat atrophic vaginitis. If vaginal dryness occurs, lubricants and moisturizing creams may help. You may need to avoid scented soaps, sprays, or douches.  Stopping use of a product that is causing allergic vaginitis. Then using a vaginal cream to treat the symptoms.  Follow these instructions at home: Lifestyle  Keep your genital area clean and dry. Avoid soap, and only rinse the area with water.  Do not douche or use tampons until your health care provider says it is okay to do so. Use sanitary pads, if needed.  Do not have sex until your health care provider approves. When you can return to sex, practice safe sex and use condoms.  Wipe from front to back. This avoids the spread of bacteria from the rectum to the vagina. General instructions  Take over-the-counter and prescription medicines only as told by your health care provider.  If you were prescribed an antibiotic medicine, take or use it as told by your health care provider. Do not stop taking or using the antibiotic even if you start to feel better.  Keep all follow-up visits as told by your health care provider. This is important. How is this prevented?  Use mild, non-scented products. Do not use things that can irritate the vagina, such as fabric softeners. Avoid the following products if they are scented: ? Feminine sprays. ? Detergents. ? Tampons. ? Feminine hygiene products. ? Soaps or bubble baths.  Let air reach your genital area. ? Wear cotton underwear to reduce moisture buildup. ? Avoid wearing underwear while you sleep. ? Avoid wearing tight pants and underwear or nylons without a cotton panel. ? Avoid wearing thong underwear.  Take off any wet clothing, such  as bathing suits, as soon as possible.  Practice safe sex and use condoms. Contact a health care provider if:  You have abdominal pain.  You have a fever.  You have symptoms that last for more than 2-3 days. Get help right away if:  You have a fever and your symptoms suddenly get worse. Summary  Vaginitis is a condition in which the vaginal tissue becomes inflamed.This condition is most often caused by a  change in the normal balance of bacteria and yeast that live in the vagina.  Treatment varies depending on the type of vaginitis you have.  Do not douche, use tampons , or have sex until your health care provider approves. When you can return to sex, practice safe sex and use condoms. This information is not intended to replace advice given to you by your health care provider. Make sure you discuss any questions you have with your health care provider. Document Released: 01/28/2007 Document Revised: 05/08/2016 Document Reviewed: 05/08/2016 Elsevier Interactive Patient Education  [employer].

## 2017-10-23 NOTE — Progress Notes (Signed)
   GYNECOLOGY OFFICE VISIT NOTE  History:  22 y.o. G0 here today for management of recurrent yeast vaginitis. Reports recurrent vaginitis for 2 years (many positive tests in system). Has taken multiple Diflucan doses and some Monistat.  . She denies any abnormal vaginal discharge, bleeding, pelvic pain or other concerns.   Past Medical History:  Diagnosis Date  . Migraine with aura   . Scoliosis     Past Surgical History:  Procedure Laterality Date  . NO PAST SURGERIES      The following portions of the patient's history were reviewed and updated as appropriate: allergies, current medications, past family history, past medical history, past social history, past surgical history and problem list.   Health Maintenance:  Normal pap on 01/28/2017.  Review of Systems:  Pertinent items noted in HPI and remainder of comprehensive ROS otherwise negative.  Objective:  Physical Exam AFVSS CONSTITUTIONAL: Well-developed, well-nourished female in no acute distress.  HEENT:  Normocephalic, atraumatic. External right and left ear normal. No scleral icterus.  NECK: Normal range of motion, supple, no masses noted on observation SKIN: Skin is warm and dry. No rash noted. Not diaphoretic. No erythema. No pallor. MUSCULOSKELETAL: Normal range of motion. No edema noted. NEUROLOGIC: Alert and oriented to person, place, and time. Normal muscle tone coordination. No cranial nerve deficit noted. PSYCHIATRIC: Normal mood and affect. Normal behavior. Normal judgment and thought content. CARDIOVASCULAR: Normal heart rate noted RESPIRATORY: Effort and breath sounds normal, no problems with respiration noted ABDOMEN: Soft, no distention noted.   PELVIC: Normal appearing external genitalia; normal appearing vaginal mucosa and cervix. Thick, white, clumpy abnormal discharge noted.  Normal uterine size, no other palpable masses, no uterine or adnexal tenderness.  Labs and Imaging No results found for this or  any previous visit (from the past 168 hour(s)). No results found.  Assessment & Plan:  1. Recurrent candidal vaginitis Discussed management of recurrent yeast vaginitis in detail, and how to establish proper vaginal pH.  Boyfriend is uncircumcised; yeast can live in the foreskin and can be passed back and forth.  Proper vulvar hygiene emphasized: discussed avoidance of perfumed soaps, detergents, lotions and any type of douches; in addition to wearing cotton underwear and no underwear at night.  Also recommended cleaning front to back, voiding and cleaning up after intercourse.  Terazol presumptively given, boric acid prescribed.  - Cervicovaginal ancillary only - terconazole (TERAZOL 3) 0.8 % vaginal cream; Place 1 applicator vaginally at bedtime. Apply nightly for three nights.  Dispense: 20 g; Refill: 2 - Boric Acid CRYS; Place 600 mg vaginally at bedtime. Use vaginally every night for two weeks then twice a week  Dispense: 500 g; Refill: 10  2. Need for HPV vaccine Information given to her to review. Declined this during the visit.   Routine preventative health maintenance measures emphasized. Please refer to After Visit Summary for other counseling recommendations.   Return in about 2 months (around 12/24/2017), or if symptoms worsen or fail to improve, for Follow up for recurrent vaginitis.   Total face-to-face time with patient: 20 minutes.  Over 50% of encounter was spent on counseling and coordination of care.   Jaynie CollinsUGONNA  Macsen Nuttall, MD, FACOG Obstetrician & Gynecologist, [facility], [facility]

## 2017-10-25 LAB — CERVICOVAGINAL ANCILLARY ONLY
Bacterial vaginitis: NEGATIVE
Candida vaginitis: POSITIVE — AB
Chlamydia: NEGATIVE
NEISSERIA GONORRHEA: NEGATIVE
Trichomonas: NEGATIVE

## 2017-11-06 ENCOUNTER — Encounter: Payer: Self-pay | Admitting: Obstetrics & Gynecology

## 2017-11-12 ENCOUNTER — Encounter: Payer: Self-pay | Admitting: Neurology

## 2017-11-13 ENCOUNTER — Encounter: Payer: Self-pay | Admitting: *Deleted

## 2017-11-13 ENCOUNTER — Encounter: Payer: Self-pay | Admitting: Neurology

## 2017-11-21 ENCOUNTER — Encounter: Payer: Self-pay | Admitting: *Deleted

## 2017-11-26 ENCOUNTER — Telehealth: Payer: Self-pay

## 2017-11-26 ENCOUNTER — Encounter: Payer: Self-pay | Admitting: Adult Health

## 2017-11-26 ENCOUNTER — Ambulatory Visit: Payer: Self-pay | Admitting: Adult Health

## 2017-11-26 NOTE — Telephone Encounter (Signed)
Patient no showed office visit for today.

## 2018-01-08 ENCOUNTER — Ambulatory Visit (INDEPENDENT_AMBULATORY_CARE_PROVIDER_SITE_OTHER): Payer: BLUE CROSS/BLUE SHIELD | Admitting: Obstetrics & Gynecology

## 2018-01-08 ENCOUNTER — Encounter: Payer: Self-pay | Admitting: Obstetrics & Gynecology

## 2018-01-08 VITALS — BP 109/65 | HR 70 | Ht 62.0 in | Wt 124.8 lb

## 2018-01-08 DIAGNOSIS — N760 Acute vaginitis: Secondary | ICD-10-CM

## 2018-01-08 DIAGNOSIS — Z7189 Other specified counseling: Secondary | ICD-10-CM

## 2018-01-08 DIAGNOSIS — Z7185 Encounter for immunization safety counseling: Secondary | ICD-10-CM

## 2018-01-08 NOTE — Patient Instructions (Signed)
Vaginitis Vaginitis is a condition in which the vaginal tissue swells and becomes red (inflamed). This condition is most often caused by a change in the normal balance of bacteria and yeast that live in the vagina. This change causes an overgrowth of certain bacteria or yeast, which causes the inflammation. There are different types of vaginitis, but the most common types are:  Bacterial vaginosis.  Yeast infection (candidiasis).  Trichomoniasis vaginitis. This is a sexually transmitted disease (STD).  Viral vaginitis.  Atrophic vaginitis.  Allergic vaginitis.  What are the causes? The cause of this condition depends on the type of vaginitis. It can be caused by:  Bacteria (bacterial vaginosis).  Yeast, which is a fungus (yeast infection).  A parasite (trichomoniasis vaginitis).  A virus (viral vaginitis).  Low hormone levels (atrophic vaginitis). Low hormone levels can occur during pregnancy, breastfeeding, or after menopause.  Irritants, such as bubble baths, scented tampons, and feminine sprays (allergic vaginitis).  Other factors can change the normal balance of the yeast and bacteria that live in the vagina. These include:  Antibiotic medicines.  Poor hygiene.  Diaphragms, vaginal sponges, spermicides, birth control pills, and intrauterine devices (IUD).  Sex.  Infection.  Uncontrolled diabetes.  A weakened defense (immune) system.  What increases the risk? This condition is more likely to develop in women who:  Smoke.  Use vaginal douches, scented tampons, or scented sanitary pads.  Wear tight-fitting pants.  Wear thong underwear.  Use oral birth control pills or an IUD.  Have sex without a condom.  Have multiple sex partners.  Have an STD.  Frequently use the spermicide nonoxynol-9.  Eat lots of foods high in sugar.  Have uncontrolled diabetes.  Have low estrogen levels.  Have a weakened immune system from an immune disorder or  medical treatment.  Are pregnant or breastfeeding.  What are the signs or symptoms? Symptoms vary depending on the cause of the vaginitis. Common symptoms include:  Abnormal vaginal discharge. ? The discharge is white, gray, or yellow with bacterial vaginosis. ? The discharge is thick, white, and cheesy with a yeast infection. ? The discharge is frothy and yellow or greenish with trichomoniasis.  A bad vaginal smell. The smell is fishy with bacterial vaginosis.  Vaginal itching, pain, or swelling.  Sex that is painful.  Pain or burning when urinating.  Sometimes there are no symptoms. How is this diagnosed? This condition is diagnosed based on your symptoms and medical history. A physical exam, including a pelvic exam, will also be done. You may also have other tests, including:  Tests to determine the pH level (acidity or alkalinity) of your vagina.  A whiff test, to assess the odor that results when a sample of your vaginal discharge is mixed with a potassium hydroxide solution.  Tests of vaginal fluid. A sample will be examined under a microscope.  How is this treated? Treatment varies depending on the type of vaginitis you have. Your treatment may include:  Antibiotic creams or pills to treat bacterial vaginosis and trichomoniasis.  Antifungal medicines, such as vaginal creams or suppositories, to treat a yeast infection.  Medicine to ease discomfort if you have viral vaginitis. Your sexual partner should also be treated.  Estrogen delivered in a cream, pill, suppository, or vaginal ring to treat atrophic vaginitis. If vaginal dryness occurs, lubricants and moisturizing creams may help. You may need to avoid scented soaps, sprays, or douches.  Stopping use of a product that is causing allergic vaginitis. Then using  cream to treat the symptoms.  Follow these instructions at home: Lifestyle  Keep your genital area clean and dry. Avoid soap, and only rinse the area  with water.  Do not douche or use tampons until your health care provider says it is okay to do so. Use sanitary pads, if needed.  Do not have sex until your health care provider approves. When you can return to sex, practice safe sex and use condoms.  Wipe from front to back. This avoids the spread of bacteria from the rectum to the vagina. General instructions  Take over-the-counter and prescription medicines only as told by your health care provider.  If you were prescribed an antibiotic medicine, take or use it as told by your health care provider. Do not stop taking or using the antibiotic even if you start to feel better.  Keep all follow-up visits as told by your health care provider. This is important. How is this prevented?  Use mild, non-scented products. Do not use things that can irritate the vagina, such as fabric softeners. Avoid the following products if they are scented: ? Feminine sprays. ? Detergents. ? Tampons. ? Feminine hygiene products. ? Soaps or bubble baths.  Let air reach your genital area. ? Wear cotton underwear to reduce moisture buildup. ? Avoid wearing underwear while you sleep. ? Avoid wearing tight pants and underwear or nylons without a cotton panel. ? Avoid wearing thong underwear.  Take off any wet clothing, such as bathing suits, as soon as possible.  Practice safe sex and use condoms. Contact a health care provider if:  You have abdominal pain.  You have a fever.  You have symptoms that last for more than 2-3 days. Get help right away if:  You have a fever and your symptoms suddenly get worse. Summary  Vaginitis is a condition in which the vaginal tissue becomes inflamed.This condition is most often caused by a change in the normal balance of bacteria and yeast that live in the vagina.  Treatment varies depending on the type of vaginitis you have.  Do not douche, use tampons , or have sex until your health care provider approves.  When you can return to sex, practice safe sex and use condoms. This information is not intended to replace advice given to you by your health care provider. Make sure you discuss any questions you have with your health care provider. Document Released: 01/28/2007 Document Revised: 05/08/2016 Document Reviewed: 05/08/2016 Elsevier Interactive Patient Education  2018 Elsevier Inc. Vaginal Yeast infection, Adult Vaginal yeast infection is a condition that causes soreness, swelling, and redness (inflammation) of the vagina. It also causes vaginal discharge. This is a common condition. Some women get this infection frequently. What are the causes? This condition is caused by a change in the normal balance of the yeast (candida) and bacteria that live in the vagina. This change causes an overgrowth of yeast, which causes the inflammation. What increases the risk? This condition is more likely to develop in:  Women who take antibiotic medicines.  Women who have diabetes.  Women who take birth control pills.  Women who are pregnant.  Women who douche often.  Women who have a weak defense (immune) system.  Women who have been taking steroid medicines for a long time.  Women who frequently wear tight clothing.  What are the signs or symptoms? Symptoms of this condition include:  White, thick vaginal discharge.  Swelling, itching, redness, and irritation of the vagina. The lips of   the vagina (vulva) may be affected as well.  Pain or a burning feeling while urinating.  Pain during sex.  How is this diagnosed? This condition is diagnosed with a medical history and physical exam. This will include a pelvic exam. Your health care provider will examine a sample of your vaginal discharge under a microscope. Your health care provider may send this sample for testing to confirm the diagnosis. How is this treated? This condition is treated with medicine. Medicines may be over-the-counter or  prescription. You may be told to use one or more of the following:  Medicine that is taken orally.  Medicine that is applied as a cream.  Medicine that is inserted directly into the vagina (suppository).  Follow these instructions at home:  Take or apply over-the-counter and prescription medicines only as told by your health care provider.  Do not have sex until your health care provider has approved. Tell your sex partner that you have a yeast infection. That person should go to his or her health care provider if he or she develops symptoms.  Do not wear tight clothes, such as pantyhose or tight pants.  Avoid using tampons until your health care provider approves.  Eat more yogurt. This may help to keep your yeast infection from returning.  Try taking a sitz bath to help with discomfort. This is a warm water bath that is taken while you are sitting down. The water should only come up to your hips and should cover your buttocks. Do this 3-4 times per day or as told by your health care provider.  Do not douche.  Wear breathable, cotton underwear.  If you have diabetes, keep your blood sugar levels under control. Contact a health care provider if:  You have a fever.  Your symptoms go away and then return.  Your symptoms do not get better with treatment.  Your symptoms get worse.  You have new symptoms.  You develop blisters in or around your vagina.  You have blood coming from your vagina and it is not your menstrual period.  You develop pain in your abdomen. This information is not intended to replace advice given to you by your health care provider. Make sure you discuss any questions you have with your health care provider. Document Released: 01/10/2005 Document Revised: 09/14/2015 Document Reviewed: 10/04/2014 Elsevier Interactive Patient Education  2018 Elsevier Inc.  

## 2018-01-08 NOTE — Progress Notes (Signed)
    GYNECOLOGY OFFICE VISIT NOTE  History:  22 y.o. G0P0000 here today for follow up after treatment of recurrent yeast vaginitis. Currently using boric acid capsules twice a week. She denies any abnormal vaginal discharge, bleeding, pelvic pain or other concerns.   Past Medical History:  Diagnosis Date  . Migraine with aura   . Scoliosis     Past Surgical History:  Procedure Laterality Date  . NO PAST SURGERIES      The following portions of the patient's history were reviewed and updated as appropriate: allergies, current medications, past family history, past medical history, past social history, past surgical history and problem list.   Health Maintenance:  Normal pap in 01/2017.  Review of Systems:  Pertinent items noted in HPI and remainder of comprehensive ROS otherwise negative.  Objective:  Physical Exam BP 109/65   Pulse 70   Ht 5\' 2"  (1.575 m)   Wt 124 lb 12.8 oz (56.6 kg)   LMP 11/25/2017 (Approximate)   BMI 22.83 kg/m  CONSTITUTIONAL: Well-developed, well-nourished female in no acute distress.  HEENT:  Normocephalic, atraumatic. External right and left ear normal. No scleral icterus.  NECK: Normal range of motion, supple, no masses noted on observation SKIN: Skin is warm and dry. No rash noted. Not diaphoretic. No erythema. No pallor. MUSCULOSKELETAL: Normal range of motion. No edema noted. NEUROLOGIC: Alert and oriented to person, place, and time. Normal muscle tone coordination. No cranial nerve deficit noted. PSYCHIATRIC: Normal mood and affect. Normal behavior. Normal judgment and thought content. CARDIOVASCULAR: Normal heart rate noted RESPIRATORY: Effort and breath sounds normal, no problems with respiration noted ABDOMEN: Soft, no distention noted.   PELVIC: Deferred   Assessment & Plan:  1. Recurrent candidal vaginitis Doing well on boric acid, will continue for 4-6 months. Proper vulvar hygiene emphasized: discussed avoidance of perfumed soaps,  detergents, lotions and any type of douches; in addition to wearing cotton underwear and no underwear at night.  Also recommended cleaning front to back, voiding and cleaning up after intercourse.   2. HPV vaccine counseling Information given to her to review. Declined this during the visit.  Routine preventative health maintenance measures emphasized. Please refer to After Visit Summary for other counseling recommendations.   Return if symptoms worsen or fail to improve.   Total face-to-face time with patient: 15 minutes.  Over 50% of encounter was spent on counseling and coordination of care.   Jaynie Collins, MD, FACOG Obstetrician & Gynecologist, [facility] for [employer], [facility]

## 2018-02-10 ENCOUNTER — Ambulatory Visit: Payer: BLUE CROSS/BLUE SHIELD | Admitting: Physician Assistant

## 2018-02-10 ENCOUNTER — Encounter: Payer: Self-pay | Admitting: Physician Assistant

## 2018-02-10 ENCOUNTER — Other Ambulatory Visit: Payer: Self-pay

## 2018-02-10 VITALS — BP 112/75 | HR 94 | Temp 99.9°F | Resp 20 | Ht 61.97 in | Wt 127.4 lb

## 2018-02-10 DIAGNOSIS — R42 Dizziness and giddiness: Secondary | ICD-10-CM

## 2018-02-10 DIAGNOSIS — R6889 Other general symptoms and signs: Secondary | ICD-10-CM

## 2018-02-10 DIAGNOSIS — J029 Acute pharyngitis, unspecified: Secondary | ICD-10-CM | POA: Diagnosis not present

## 2018-02-10 LAB — POCT CBC
GRANULOCYTE PERCENT: 82.6 % — AB (ref 37–80)
HCT, POC: 44 % — AB (ref 29–41)
Hemoglobin: 14.7 g/dL — AB (ref 9.5–13.5)
Lymph, poc: 0.8 (ref 0.6–3.4)
MCH, POC: 26.3 pg — AB (ref 27–31.2)
MCHC: 33.4 g/dL (ref 31.8–35.4)
MCV: 78.8 fL (ref 76–111)
MID (cbc): 0.6 (ref 0–0.9)
MPV: 6.3 fL (ref 0–99.8)
PLATELET COUNT, POC: 280 10*3/uL (ref 142–424)
POC Granulocyte: 6.9 (ref 2–6.9)
POC LYMPH PERCENT: 10 %L (ref 10–50)
POC MID %: 7.4 %M (ref 0–12)
RBC: 5.58 M/uL — AB (ref 4.04–5.48)
RDW, POC: 13.1 %
WBC: 8.3 10*3/uL (ref 4.6–10.2)

## 2018-02-10 LAB — POCT RAPID STREP A (OFFICE): Rapid Strep A Screen: NEGATIVE

## 2018-02-10 LAB — POC INFLUENZA A&B (BINAX/QUICKVUE)
Influenza A, POC: NEGATIVE
Influenza B, POC: NEGATIVE

## 2018-02-10 LAB — POCT URINE PREGNANCY: PREG TEST UR: NEGATIVE

## 2018-02-10 MED ORDER — MECLIZINE HCL 12.5 MG PO TABS: 12.5000 mg | ORAL_TABLET | Freq: Three times a day (TID) | ORAL | 0 refills | Status: DC | PRN

## 2018-02-10 MED ORDER — AZELASTINE HCL 0.1 % NA SOLN: 2.0000 | Freq: Two times a day (BID) | NASAL | 0 refills | Status: DC

## 2018-02-10 NOTE — Patient Instructions (Addendum)
- We will treat this as a respiratory viral infection.  - I recommend you rest, drink plenty of fluids, eat light meals including soups.  - You may use use nasal spray for your congestion.  -For dizziness, use antivert as needed.  - I recommend liquid Tylenol or ibuprofen over-the-counter for your sore throat, fever, and body aches. Tea recipe for sore throat: boil water, add 2 inches shaved ginger root, steep 15 minutes, add juice from 2 full lemons, and 2 tbsp honey. - Please let me know if you are not seeing any improvement or get worse in 5-7 days.      If you have lab work done today you will be contacted with your lab results within the next 2 weeks.  If you have not heard from [place] then please contact us. The fastest way to get your results is to register for My Chart.  Influenza, Adult Influenza ("the flu") is an infection in the lungs, nose, and throat (respiratory tract). It is caused by a virus. The flu causes many common cold symptoms, as well as a high fever and body aches. It can make you feel very sick. The flu spreads easily from person to person (is contagious). Getting a flu shot (influenza vaccination) every year is the best way to prevent the flu. Follow these instructions at home:  Take over-the-counter and prescription medicines only as told by your doctor.  Use a cool mist humidifier to add moisture (humidity) to the air in your home. This can make it easier to breathe.  Rest as needed.  Drink enough fluid to keep your pee (urine) clear or pale yellow.  Cover your mouth and nose when you cough or sneeze.  Wash your hands with soap and water often, especially after you cough or sneeze. If you cannot use soap and water, use hand sanitizer.  Stay home from work or school as told by your doctor. Unless you are visiting your doctor, try to avoid leaving home until your fever has been gone for 24 hours without the use of medicine.  Keep all follow-up visits as told by  your doctor. This is important. How is this prevented?  Getting a yearly (annual) flu shot is the best way to avoid getting the flu. You may get the flu shot in late summer, fall, or winter. Ask your doctor when you should get your flu shot.  Wash your hands often or use hand sanitizer often.  Avoid contact with people who are sick during cold and flu season.  Eat healthy foods.  Drink plenty of fluids.  Get enough sleep.  Exercise regularly. Contact a doctor if:  You get new symptoms.  You have: ? Chest pain. ? Watery poop (diarrhea). ? A fever.  Your cough gets worse.  You start to have more mucus.  You feel sick to your stomach (nauseous).  You throw up (vomit). Get help right away if:  You start to be short of breath or have trouble breathing.  Your skin or nails turn a bluish color.  You have very bad pain or stiffness in your neck.  You get a sudden headache.  You get sudden pain in your face or ear.  You cannot stop throwing up. This information is not intended to replace advice given to you by your health care provider. Make sure you discuss any questions you have with your health care provider. Document Released: 01/10/2008 Document Revised: 09/08/2015 Document Reviewed: 01/25/2015 Elsevier Interactive Patient Education  2017 Elsevier Inc.   IF you received an x-ray today, you will receive an invoice from [facility]. Please contact [facility] at [phone] with questions or concerns regarding your invoice.   IF you received labwork today, you will receive an invoice from [facility]. Please contact [facility] at [phone] with questions or concerns regarding your invoice.   Our billing staff will not be able to assist you with questions regarding bills from these companies.  You will be contacted with the lab results as soon as they are available. The fastest way to get your results is to activate your My Chart account.  Instructions are located on the last page of this paperwork. If you have not heard from [place] regarding the results in 2 weeks, please contact this office.

## 2018-02-10 NOTE — Progress Notes (Signed)
p    MRN: [id number] DOB: 21-Aug-1995  Subjective:   Terri Combs is a 22 y.o. female presenting for chief complaint of Sore Throat (X 1 .5 days); Dizziness (X 1 day with headache); and Back Pain (X 1.5 day) .  Reports 2 day history of sore throat, runny nose, nasal congestion, body aches. Has associated ear fullness, lightheadness sensation,sinus headache, and subjective fever. Has tried nyquil with no relief. Denies, wheezing, shortness of breath, chest tightness and chest pain, nausea, vomiting, abdominal pain, diarrhea and visual disturbance. Has ha sick contact with people at work. No history of seasonal allergies, no history of asthma. Patient has not had flu shot this season. Denies smoking. LMP 01/17/18. Not on contraception.  Denies any other aggravating or relieving factors, no other questions or concerns.   Review of Systems  Cardiovascular: Negative for palpitations.  Gastrointestinal: Negative for abdominal pain.  Genitourinary: Negative for dysuria, flank pain, frequency, hematuria and urgency.  Musculoskeletal: Negative for neck pain.  Skin: Negative for rash.  Neurological: Negative for tingling, sensory change, speech change, focal weakness and weakness.       Denies saddle anesthesia and bladder/bowel incontinence.    Terri Combs has a current medication list which includes the following prescription(s): sumatriptan, azelastine, and meclizine. Also has No Known Allergies.  Terri Combs  has a past medical history of Migraine with aura and Scoliosis. Also  has a past surgical history that includes No past surgeries.   Objective:   Vitals: BP 112/75   Pulse 94   Temp 99.9 F (37.7 C) (Oral)   Resp 20   Ht 5' 1.97" (1.574 m)   Wt 127 lb 6.4 oz (57.8 kg)   LMP 01/17/2018 (Exact Date)   SpO2 96%   BMI 23.33 kg/m   Physical Exam  Constitutional: She is oriented to person, place, and time. She appears well-developed and well-nourished. She does not appear ill. No distress.  HENT:  Head:  Normocephalic and atraumatic.  Right Ear: External ear and ear canal normal. Tympanic membrane is not erythematous and not bulging. A middle ear effusion is present.  Left Ear: External ear and ear canal normal. Tympanic membrane is not erythematous and not bulging. A middle ear effusion is present.  Nose: Mucosal edema and rhinorrhea present. Right sinus exhibits no maxillary sinus tenderness and no frontal sinus tenderness. Left sinus exhibits no maxillary sinus tenderness and no frontal sinus tenderness.  Mouth/Throat: Uvula is midline and mucous membranes are normal. Posterior oropharyngeal erythema present. No posterior oropharyngeal edema or tonsillar abscesses. Tonsils are 1+ on the right. Tonsils are 1+ on the left. No tonsillar exudate.  Eyes: Conjunctivae are normal.  Neck: Normal range of motion. No Brudzinski's sign and no Kernig's sign noted.  Cardiovascular: Normal rate, regular rhythm, normal heart sounds and intact distal pulses.  Pulmonary/Chest: Effort normal and breath sounds normal. She has no decreased breath sounds. She has no wheezes. She has no rhonchi. She has no rales.  Musculoskeletal:       Lumbar back: She exhibits tenderness (+TTP with palpation of b/l lumbar musculature). She exhibits normal range of motion, no bony tenderness, no swelling, no edema and no spasm.  Lymphadenopathy:       Head (right side): No submental, no submandibular, no tonsillar, no preauricular, no posterior auricular and no occipital adenopathy present.       Head (left side): No submental, no submandibular, no tonsillar, no preauricular, no posterior auricular and no occipital adenopathy present.    She  has no cervical adenopathy.       Right: No supraclavicular adenopathy present.       Left: No supraclavicular adenopathy present.  Neurological: She is alert and oriented to person, place, and time. She has normal strength and normal reflexes. No cranial nerve deficit or sensory deficit. She  displays a negative Romberg sign. Gait normal.  Skin: Skin is warm and dry.  Skin is warm to palpation.   Psychiatric: She has a normal mood and affect.  Vitals reviewed.   Results for orders placed or performed in visit on 02/10/18 (from the past 24 hour(s))  POCT rapid strep A     Status: None   Collection Time: 02/10/18  5:01 PM  Result Value Ref Range   Rapid Strep A Screen Negative Negative  POCT CBC     Status: Abnormal   Collection Time: 02/10/18  5:16 PM  Result Value Ref Range   WBC 8.3 4.6 - 10.2 K/uL   Lymph, poc 0.8 0.6 - 3.4   POC LYMPH PERCENT 10.0 10 - 50 %L   MID (cbc) 0.6 0 - 0.9   POC MID % 7.4 0 - 12 %M   POC Granulocyte 6.9 2 - 6.9   Granulocyte percent 82.6 (A) 37 - 80 %G   RBC 5.58 (A) 4.04 - 5.48 M/uL   Hemoglobin 14.7 (A) 9.5 - 13.5 g/dL   HCT, POC 16.1 (A) 29 - 41 %   MCV 78.8 76 - 111 fL   MCH, POC 26.3 (A) 27 - 31.2 pg   MCHC 33.4 31.8 - 35.4 g/dL   RDW, POC 09.6 %   Platelet Count, POC 280 142 - 424 K/uL   MPV 6.3 0 - 99.8 fL  POC Influenza A&B(BINAX/QUICKVUE)     Status: None   Collection Time: 02/10/18  5:25 PM  Result Value Ref Range   Influenza A, POC Negative Negative   Influenza B, POC Negative Negative  POCT urine pregnancy     Status: None   Collection Time: 02/10/18  5:47 PM  Result Value Ref Range   Preg Test, Ur Negative Negative   Orthostatic VS for the past 24 hrs:  BP- Lying Pulse- Lying BP- Sitting Pulse- Sitting BP- Standing at 0 minutes Pulse- Standing at 0 minutes  02/10/18 1719 120/80 86 126/83 88 127/88 98   Assessment and Plan :  1. Flu-like symptoms Physical exam and point of care lab findings reassuring. This is consistent with a viral etiology, will treat symptomatically. Patient instructed to return to clinic if symptoms worsen, do not improve in 5-7 days, or as needed. - azelastine (ASTELIN) 0.1 % nasal spray; Place 2 sprays into both nostrils 2 (two) times daily. Use in each nostril as directed  Dispense: 30 mL;  Refill: 0  2. Sore throat - POCT rapid strep A - POC Influenza A&B(BINAX/QUICKVUE) - POCT CBC - Culture, Group A Strep  3. Dizziness Likely due to MEE and rhinitis. No acute findings noted on exam. Normal cardiovascular and neuro exam. Suggest nasal spray daily and antivert as needed.  - Orthostatic vital signs - meclizine (ANTIVERT) 12.5 MG tablet; Take 1 tablet (12.5 mg total) by mouth 3 (three) times daily as needed for dizziness.  Dispense: 30 tablet; Refill: 0 - POCT urine pregnancy   Benjiman Core, PA-C  [facility] at [facility] 02/10/2018 5:53 PM

## 2018-02-12 ENCOUNTER — Encounter: Payer: Self-pay | Admitting: Physician Assistant

## 2018-02-12 ENCOUNTER — Other Ambulatory Visit: Payer: Self-pay | Admitting: Physician Assistant

## 2018-02-12 LAB — CULTURE, GROUP A STREP

## 2018-02-12 MED ORDER — AMOXICILLIN 500 MG PO CAPS: 500.0000 mg | ORAL_CAPSULE | Freq: Two times a day (BID) | ORAL | 0 refills | Status: DC

## 2018-02-12 MED ORDER — FLUCONAZOLE 150 MG PO TABS: 150.0000 mg | ORAL_TABLET | Freq: Once | ORAL | 0 refills | Status: AC

## 2018-02-12 NOTE — Progress Notes (Signed)
Meds ordered this encounter  Medications  . fluconazole (DIFLUCAN) 150 MG tablet    Sig: Take 1 tablet (150 mg total) by mouth once for 1 dose.    Dispense:  1 tablet    Refill:  0    Order Specific Question:   Supervising Provider    Answer:   SAGARDIA, MIGUEL JOSE [[id number]]    

## 2018-02-12 NOTE — Progress Notes (Signed)
Contacted pt via phone. Strep + for beta hemolytic strep. Sent in Rx for amoxicillin.

## 2018-03-03 ENCOUNTER — Other Ambulatory Visit: Payer: Self-pay | Admitting: Physician Assistant

## 2018-03-03 DIAGNOSIS — R6889 Other general symptoms and signs: Secondary | ICD-10-CM

## 2018-03-04 NOTE — Telephone Encounter (Signed)
Requested Prescriptions  Pending Prescriptions Disp Refills  . azelastine (ASTELIN) 0.1 % nasal spray [Pharmacy Med Name: AZELASTINE 0.1% (137 MCG) SPRY] 30 mL 0    Sig: PLACE 2 SPRAYS INTO BOTH NOSTRILS 2 (TWO) TIMES DAILY. USE IN EACH NOSTRIL AS DIRECTED     Ear, Nose, and Throat: Nasal Preparations - Antiallergy Passed - 03/03/2018  1:44 AM      Passed - Valid encounter within last 12 months    Recent Outpatient Visits          3 weeks ago Flu-like symptoms   [facility], [place], PA-C   9 months ago Yeast infection   [facility], [place], [place]   11 months ago Dysuria   [facility], Manus RuddZoe A, MD   12 months ago Subacute vaginitis   [facility], Madelaine BhatElizabeth Whitney, PA-C   1 year ago Facial erythema   [facility], Marolyn HammockMichael L, PA-C

## 2018-04-23 ENCOUNTER — Encounter: Payer: Self-pay | Admitting: Physician Assistant

## 2018-04-23 ENCOUNTER — Telehealth: Payer: Self-pay | Admitting: Medical

## 2018-04-23 NOTE — Telephone Encounter (Signed)
Called in stated she has not had a normal period in two months. She notices red when she wipes but no period. Informed her of date and time however the patient would to be seen sooner. Will come to after hours clinic tomorrow.

## 2018-04-24 ENCOUNTER — Encounter: Payer: Self-pay | Admitting: Family Medicine

## 2018-04-24 ENCOUNTER — Ambulatory Visit (INDEPENDENT_AMBULATORY_CARE_PROVIDER_SITE_OTHER): Payer: Self-pay | Admitting: Family Medicine

## 2018-04-24 VITALS — BP 120/72 | HR 67 | Ht 61.0 in | Wt 134.7 lb

## 2018-04-24 DIAGNOSIS — N939 Abnormal uterine and vaginal bleeding, unspecified: Secondary | ICD-10-CM

## 2018-04-24 DIAGNOSIS — N760 Acute vaginitis: Secondary | ICD-10-CM

## 2018-04-24 DIAGNOSIS — R635 Abnormal weight gain: Secondary | ICD-10-CM

## 2018-04-24 LAB — POCT PREGNANCY, URINE: PREG TEST UR: NEGATIVE

## 2018-04-24 NOTE — Progress Notes (Signed)
   Subjective:    Lynix Glendening - 23 y.o. female MRN [id number]  Date of birth: 1995-09-12  HPI  Terri Combs is a 23 y.o. G0P0000 female here for irregular periods. States that periods have been irregular since they started. Had a period monthly on birth control, but since then has not. Stopped birth control in 2017 about. Was on OCPs, but wanted to get pregnant. Not trying to get pregnant anymore. Not on birth control and sexually active. LMP early November. Not tracking with an app. Still taking boric acid twice a week for recurrent yeast infections.    OB History    Gravida  0   Para  0   Term  0   Preterm  0   AB  0   Living  0     SAB  0   TAB  0   Ectopic  0   Multiple  0   Live Births  0             Health Maintenance:  Normal pap 01/28/2017.   There are no preventive care reminders to display for this patient.  -  reports that she has never smoked. She has never used smokeless tobacco. - Review of Systems: Per HPI. - Past Medical History: There are no active problems to display for this patient.  - Medications: reviewed and updated   Objective:   Physical Exam BP 120/72   Pulse 67   Ht 5\' 1"  (1.549 m)   Wt 134 lb 11.2 oz (61.1 kg)   LMP 02/16/2018 (Within Weeks)   BMI 25.45 kg/m  Gen: NAD, alert, cooperative with exam, well-appearing HEENT: NCAT, PERRL, clear conjunctiva, oropharynx clear, supple neck CV: RRR, good S1/S2, no murmur, no edema, capillary refill brisk  Resp: CTABL, no wheezes, non-labored Abd: SNTND, BS present, no guarding or organomegaly Skin: no rashes, normal turgor  Neuro: no gross deficits.  Psych: good insight, alert and oriented GU/GYN: deferred    Assessment & Plan:   1. Weight gain - TSH - HgB A1c  2. Abnormal uterine bleeding - encourage monitoring  - discussed birth control while trying to avoid pregnancy - patient would like nexplanon  Routine preventative health maintenance measures emphasized. Please refer to  After Visit Summary for other counseling recommendations.   No follow-ups on file.  Gwenevere Abbot, MD  OB Fellow  04/24/2018, 6:37 PM

## 2018-04-25 LAB — TSH: TSH: 0.707 u[IU]/mL (ref 0.450–4.500)

## 2018-04-25 LAB — HEMOGLOBIN A1C
Est. average glucose Bld gHb Est-mCnc: 103 mg/dL
Hgb A1c MFr Bld: 5.2 % (ref 4.8–5.6)

## 2018-05-08 ENCOUNTER — Other Ambulatory Visit: Payer: Self-pay

## 2018-05-08 ENCOUNTER — Ambulatory Visit (HOSPITAL_COMMUNITY)
Admission: EM | Admit: 2018-05-08 | Discharge: 2018-05-08 | Disposition: A | Discharge status: Home / Self Care | Payer: Medicaid Other | Attending: Family Medicine | Admitting: Family Medicine

## 2018-05-08 ENCOUNTER — Encounter (HOSPITAL_COMMUNITY): Payer: Self-pay | Admitting: Emergency Medicine

## 2018-05-08 DIAGNOSIS — J069 Acute upper respiratory infection, unspecified: Secondary | ICD-10-CM

## 2018-05-08 DIAGNOSIS — B9789 Other viral agents as the cause of diseases classified elsewhere: Secondary | ICD-10-CM

## 2018-05-08 DIAGNOSIS — R05 Cough: Secondary | ICD-10-CM

## 2018-05-08 MED ORDER — FLUTICASONE PROPIONATE 50 MCG/ACT NA SUSP: 1.0000 | Freq: Every day | NASAL | 2 refills | Status: DC

## 2018-05-08 MED ORDER — GUAIFENESIN ER 600 MG PO TB12: 600.0000 mg | ORAL_TABLET | Freq: Two times a day (BID) | ORAL | 0 refills | Status: DC

## 2018-05-08 MED ORDER — CETIRIZINE-PSEUDOEPHEDRINE ER 5-120 MG PO TB12: 1.0000 | ORAL_TABLET | Freq: Every day | ORAL | 0 refills | Status: DC

## 2018-05-08 NOTE — Discharge Instructions (Addendum)
I believe you have a viral upper respiratory infection You can do Flonase nasal spray and Zyrtec-D for congestion and drainage Mucinex for cough and chest congestion Follow up as needed for continued or worsening symptoms

## 2018-05-08 NOTE — ED Triage Notes (Signed)
PT reports headache, cough, fevers, body aches for 2 days. PT has been taking dayquil and nyquil without relief.

## 2018-05-08 NOTE — ED Provider Notes (Signed)
[facility]    CSN: [id number] Arrival date & time: 05/08/18  1304     History   Chief Complaint Chief Complaint  Patient presents with  . URI    HPI Terri Combs is a 23 y.o. female.   Patient is a 23 year old female who presents with 2 days of body aches, cough, congestion, rhinorrhea, fevers.  Her symptoms have somewhat improved.  She has been taking DayQuil, NyQuil and nasal spray with some relief.  Her biggest complaint today is the nasal congestion.  She denies any recent sick contacts.  She denies any recent traveling.  Denies any nausea, vomiting, diarrhea.  ROS per HPI      Past Medical History:  Diagnosis Date  . Migraine with aura   . Scoliosis   . Vaginal yeast infection     There are no active problems to display for this patient.   Past Surgical History:  Procedure Laterality Date  . NO PAST SURGERIES      OB History    Gravida  0   Para  0   Term  0   Preterm  0   AB  0   Living  0     SAB  0   TAB  0   Ectopic  0   Multiple  0   Live Births  0            Home Medications    Prior to Admission medications   Medication Sig Start Date End Date Taking? Authorizing Provider  azelastine (ASTELIN) 0.1 % nasal spray PLACE 2 SPRAYS INTO BOTH NOSTRILS 2 (TWO) TIMES DAILY. USE IN [facility] NOSTRIL AS DIRECTED 03/04/18   Benjiman Core D, PA-C  BORIC ACID EX Apply topically 2 (two) times a week.    [provider]  cetirizine-pseudoephedrine (ZYRTEC-D) 5-120 MG tablet Take 1 tablet by mouth daily. 05/08/18   Oluwanifemi Susman, Gloris Manchester A, NP  fluticasone (FLONASE) 50 MCG/ACT nasal spray Place 1 spray into both nostrils daily. 05/08/18   Dahlia Byes A, NP  guaiFENesin (MUCINEX) 600 MG 12 hr tablet Take 1 tablet (600 mg total) by mouth 2 (two) times daily. 05/08/18   Dahlia Byes A, NP  meclizine (ANTIVERT) 12.5 MG tablet Take 1 tablet (12.5 mg total) by mouth 3 (three) times daily as needed for dizziness. 02/10/18   Benjiman Core D, PA-C   SUMAtriptan (IMITREX) 50 MG tablet Take 1 tablet (50 mg total) by mouth every 2 (two) hours as needed for migraine. May repeat in 2 hours. Max of 2 tabs per day. 11/15/16   Anson Fret, MD    Family History Family History  Problem Relation Age of Onset  . Migraines Mother     Social History Social History   Tobacco Use  . Smoking status: Never Smoker  . Smokeless tobacco: Never Used  Substance Use Topics  . Alcohol use: No  . Drug use: No     Allergies   Patient has no known allergies.   Review of Systems Review of Systems   Physical Exam Triage Vital Signs ED Triage Vitals  Enc Vitals Group     BP 05/08/18 1536 123/82     Pulse Rate 05/08/18 1536 88     Resp 05/08/18 1536 16     Temp 05/08/18 1536 98.6 F (37 C)     Temp Source 05/08/18 1536 Temporal     SpO2 05/08/18 1536 100 %     Weight --  Height --      Head Circumference --      Peak Flow --      Pain Score 05/08/18 1537 5     Pain Loc --      Pain Edu? --      Excl. in GC? --    No data found.  Updated Vital Signs BP 123/82   Pulse 88   Temp 98.6 F (37 C) (Temporal)   Resp 16   LMP 05/06/2018   SpO2 100%   Visual Acuity Right Eye Distance:   Left Eye Distance:   Bilateral Distance:    Right Eye Near:   Left Eye Near:    Bilateral Near:     Physical Exam Vitals signs and nursing note reviewed.  Constitutional:      General: She is not in acute distress.    Appearance: She is well-developed.  HENT:     Head: Normocephalic and atraumatic.     Nose: Congestion and rhinorrhea present.     Right Turbinates: Swollen.     Left Turbinates: Swollen.     Mouth/Throat:     Lips: Pink.     Mouth: Mucous membranes are moist.     Pharynx: Oropharynx is clear. No posterior oropharyngeal erythema.     Tonsils: No tonsillar exudate. Swelling: 0 on the right. 0 on the left.  Eyes:     Conjunctiva/sclera: Conjunctivae normal.  Neck:     Musculoskeletal: Neck supple.    Cardiovascular:     Rate and Rhythm: Normal rate and regular rhythm.     Heart sounds: No murmur.  Pulmonary:     Effort: Pulmonary effort is normal. No respiratory distress.     Breath sounds: Normal breath sounds.  Abdominal:     Palpations: Abdomen is soft.     Tenderness: There is no abdominal tenderness.  Musculoskeletal: Normal range of motion.  Skin:    General: Skin is warm and dry.  Neurological:     Mental Status: She is alert.  Psychiatric:        Mood and Affect: Mood normal.      UC Treatments / Results  Labs (all labs ordered are listed, but only abnormal results are displayed) Labs Reviewed - No data to display  EKG None  Radiology No results found.  Procedures Procedures (including critical care time)  Medications Ordered in UC Medications - No data to display  Initial Impression / Assessment and Plan / UC Course  I have reviewed the triage vital signs and the nursing notes.  Pertinent labs & imaging results that were available during my care of the patient were reviewed by me and considered in my medical decision making (see chart for details).     Viral URI Symptomatic treatment with Flonase nasal spray, Zyrtec-D for nasal congestion and drainage. Mucinex for cough and congestion Work note given Follow up as needed for continued or worsening symptoms  Final Clinical Impressions(s) / UC Diagnoses   Final diagnoses:  Viral URI with cough     Discharge Instructions     I believe you have a viral upper respiratory infection You can do Flonase nasal spray and Zyrtec-D for congestion and drainage Mucinex for cough and chest congestion Follow up as needed for continued or worsening symptoms     ED Prescriptions    Medication Sig Dispense Auth. Provider   fluticasone (FLONASE) 50 MCG/ACT nasal spray Place 1 spray into both nostrils daily. 16 g Keaunna Skipper A,  NP   cetirizine-pseudoephedrine (ZYRTEC-D) 5-120 MG tablet Take 1 tablet by  mouth daily. 30 tablet Treyana Sturgell A, NP   guaiFENesin (MUCINEX) 600 MG 12 hr tablet Take 1 tablet (600 mg total) by mouth 2 (two) times daily. 15 tablet Dahlia ByesBast, Fynn Vanblarcom A, NP     Controlled Substance Prescriptions [place] Controlled Substance Registry consulted? Not Applicable   Janace ArisBast, Yazlynn Birkeland A, NP 05/08/18 1740

## 2018-05-12 ENCOUNTER — Ambulatory Visit: Payer: Self-pay | Admitting: Family Medicine

## 2018-05-12 ENCOUNTER — Encounter: Payer: Self-pay | Admitting: Family Medicine

## 2018-05-12 ENCOUNTER — Other Ambulatory Visit: Payer: Self-pay

## 2018-05-12 VITALS — BP 120/77 | HR 89 | Temp 97.8°F | Ht 61.0 in | Wt 138.0 lb

## 2018-05-12 DIAGNOSIS — J069 Acute upper respiratory infection, unspecified: Secondary | ICD-10-CM

## 2018-05-12 NOTE — Progress Notes (Signed)
1/27/202012:22 PM  Terri Combs Aug 18, 1995, 23 y.o. female [id number]  Chief Complaint  Patient presents with  . Cough    flu like symptoms, fever seems to come at night. This has been going on since Tuesday. Notices blood in her mucus when she blows her nose. Taking nyquil for her symptoms. Went to urgent care was diagnosed with cold    HPI:   Patient is a 23 y.o. female who presents today for nasal congestion  A week ago started with sore throat, body aches, fever and chills, mild cough As days progressed sign nasal congestion, headache, blood nasal mucous, cough No SOB Feels she is getting worse, still having fever and chills at night Seen in ER on 05/08/2018 - told it was viral, instructed in supportive measures Has been taking dayquil and nyquil  Fall Risk  05/12/2018 02/10/2018 04/03/2017 03/09/2017 01/28/2017  Falls in the past year? 0 No No No No     Depression screen [facility] 2/9 05/12/2018 02/10/2018 01/08/2018  Decreased Interest 0 0 1  Down, Depressed, Hopeless 0 0 0  PHQ - 2 Score 0 0 1  Altered sleeping - - 1  Tired, decreased energy - - 2  Change in appetite - - 0  Feeling bad or failure about yourself  - - 0  Trouble concentrating - - 0  Moving slowly or fidgety/restless - - 0  Suicidal thoughts - - 0  PHQ-9 Score - - 4    No Known Allergies  Prior to Admission medications   Medication Sig Start Date End Date Taking? Authorizing Provider  meclizine (ANTIVERT) 12.5 MG tablet Take 1 tablet (12.5 mg total) by mouth 3 (three) times daily as needed for dizziness. 02/10/18  Yes Benjiman Core D, PA-C  SUMAtriptan (IMITREX) 50 MG tablet Take 1 tablet (50 mg total) by mouth every 2 (two) hours as needed for migraine. May repeat in 2 hours. Max of 2 tabs per day. 11/15/16  Yes Anson Fret, MD    Past Medical History:  Diagnosis Date  . Migraine with aura   . Scoliosis   . Vaginal yeast infection     Past Surgical History:  Procedure Laterality Date  . NO PAST  SURGERIES      Social History   Tobacco Use  . Smoking status: Never Smoker  . Smokeless tobacco: Never Used  Substance Use Topics  . Alcohol use: No    Family History  Problem Relation Age of Onset  . Migraines Mother     ROS Per hpi  OBJECTIVE:  Blood pressure 120/77, pulse 89, temperature 97.8 F (36.6 C), temperature source Oral, height 5\' 1"  (1.549 m), weight 138 lb (62.6 kg), last menstrual period 05/06/2018, SpO2 96 %. Body mass index is 26.07 kg/m.   No antipyretics today  Physical Exam Vitals signs and nursing note reviewed.  Constitutional:      Appearance: She is well-developed.  HENT:     Head: Normocephalic and atraumatic.     Right Ear: Hearing, tympanic membrane, ear canal and external ear normal.     Left Ear: Hearing, tympanic membrane, ear canal and external ear normal.  Eyes:     Conjunctiva/sclera: Conjunctivae normal.     Pupils: Pupils are equal, round, and reactive to light.  Neck:     Musculoskeletal: Neck supple.  Cardiovascular:     Rate and Rhythm: Normal rate and regular rhythm.     Heart sounds: Normal heart sounds. No murmur. No friction rub. No  gallop.   Pulmonary:     Effort: Pulmonary effort is normal.     Breath sounds: Normal breath sounds. No wheezing or rales.  Lymphadenopathy:     Cervical: No cervical adenopathy.  Skin:    General: Skin is warm and dry.  Neurological:     Mental Status: She is alert and oriented to person, place, and time.      ASSESSMENT and PLAN  1. URI with cough and congestion Discussed supportive measures for URI: increase hydration, rest, OTC medications, etc. RTC precautions discussed.    Return if symptoms worsen or fail to improve.    Myles Lipps, MD Primary Care at [facility] [address] [place], [place] [address] Ph.  [phone] Fax [phone]

## 2018-05-12 NOTE — Patient Instructions (Addendum)
mucinex Phenylephrine - oral decongestant   Viral Respiratory Infection A respiratory infection is an illness that affects part of the respiratory system, such as the lungs, nose, or throat. A respiratory infection that is caused by a virus is called a viral respiratory infection. Common types of viral respiratory infections include:  A cold.  The flu (influenza).  A respiratory syncytial virus (RSV) infection. What are the causes? This condition is caused by a virus. What are the signs or symptoms? Symptoms of this condition include:  A stuffy or runny nose.  Yellow or green nasal discharge.  A cough.  Sneezing.  Fatigue.  Achy muscles.  A sore throat.  Sweating or chills.  A fever.  A headache. How is this diagnosed? This condition may be diagnosed based on:  Your symptoms.  A physical exam.  Testing of nasal swabs. How is this treated? This condition may be treated with medicines, such as:  Antiviral medicine. This may shorten the length of time a person has symptoms.  Expectorants. These make it easier to cough up mucus.  Decongestant nasal sprays.  Acetaminophen or NSAIDs to relieve fever and pain. Antibiotic medicines are not prescribed for viral infections. This is because antibiotics are designed to kill bacteria. They are not effective against viruses. Follow these instructions at home:  Managing pain and congestion  Take over-the-counter and prescription medicines only as told by your health care provider.  If you have a sore throat, gargle with a salt-water mixture 3-4 times a day or as needed. To make a salt-water mixture, completely dissolve -1 tsp of salt in 1 cup of warm water.  Use nose drops made from salt water to ease congestion and soften raw skin around your nose.  Drink enough fluid to keep your urine pale yellow. This helps prevent dehydration and helps loosen up mucus. General instructions  Rest as much as  possible.  Do not drink alcohol.  Do not use any products that contain nicotine or tobacco, such as cigarettes and e-cigarettes. If you need help quitting, ask your health care provider.  Keep all follow-up visits as told by your health care provider. This is important. How is this prevented?   Get an annual flu shot. You may get the flu shot in late summer, fall, or winter. Ask your health care provider when you should get your flu shot.  Avoid exposing others to your respiratory infection. ? Stay home from work or school as told by your health care provider. ? Wash your hands with soap and water often, especially after you cough or sneeze. If soap and water are not available, use alcohol-based hand sanitizer.  Avoid contact with people who are sick during cold and flu season. This is generally fall and winter. Contact a health care provider if:  Your symptoms last for 10 days or longer.  Your symptoms get worse over time.  You have a fever.  You have severe sinus pain in your face or forehead.  The glands in your jaw or neck become very swollen. Get help right away if you:  Feel pain or pressure in your chest.  Have shortness of breath.  Faint or feel like you will faint.  Have severe and persistent vomiting.  Feel confused or disoriented. Summary  A respiratory infection is an illness that affects part of the respiratory system, such as the lungs, nose, or throat. A respiratory infection that is caused by a virus is called a viral respiratory infection.  Common types of viral respiratory infections are a cold, influenza, and respiratory syncytial virus (RSV) infection.  Symptoms of this condition include a stuffy or runny nose, cough, sneezing, fatigue, achy muscles, sore throat, and fevers or chills.  Antibiotic medicines are not prescribed for viral infections. This is because antibiotics are designed to kill bacteria. They are not effective against viruses. This  information is not intended to replace advice given to you by your health care provider. Make sure you discuss any questions you have with your health care provider. Document Released: 01/10/2005 Document Revised: 05/13/2017 Document Reviewed: 05/13/2017 Elsevier Interactive Patient Education  [employer].    If you have lab work done today you will be contacted with your lab results within the next 2 weeks.  If you have not heard from [place] then please contact us. The fastest way to get your results is to register for My Chart.   IF you received an x-ray today, you will receive an invoice from [facility]. Please contact [facility] at [phone] with questions or concerns regarding your invoice.   IF you received labwork today, you will receive an invoice from [facility]. Please contact [facility] at [phone] with questions or concerns regarding your invoice.   Our billing staff will not be able to assist you with questions regarding bills from these companies.  You will be contacted with the lab results as soon as they are available. The fastest way to get your results is to activate your My Chart account. Instructions are located on the last page of this paperwork. If you have not heard from [place] regarding the results in 2 weeks, please contact this office.

## 2018-05-13 ENCOUNTER — Ambulatory Visit: Payer: Medicaid Other | Admitting: Family Medicine

## 2018-07-09 ENCOUNTER — Ambulatory Visit: Payer: Self-pay | Admitting: *Deleted

## 2018-07-09 NOTE — Telephone Encounter (Signed)
Pt calling to question if practice is doing COVID-19 testing. Pt states "Sometimes I have to take deep breath to catch my breath." Denies fever, cough. Reviewed protocols with patient, home care advised. Questions answered to pts satisfaction.Advised to CB if any additional questions arise.  Reason for Disposition . [1] No COVID-19 EXPOSURE BUT [2] questions about  Protocols used: CORONAVIRUS (COVID-19) EXPOSURE-A-AH

## 2018-11-27 ENCOUNTER — Telehealth: Payer: Self-pay | Admitting: Emergency Medicine

## 2018-11-27 ENCOUNTER — Encounter: Payer: Self-pay | Admitting: Emergency Medicine

## 2018-11-27 ENCOUNTER — Other Ambulatory Visit: Payer: Self-pay

## 2018-11-27 ENCOUNTER — Telehealth (INDEPENDENT_AMBULATORY_CARE_PROVIDER_SITE_OTHER): Payer: Managed Care, Other (non HMO) | Admitting: Emergency Medicine

## 2018-11-27 VITALS — Ht 61.5 in | Wt 148.0 lb

## 2018-11-27 DIAGNOSIS — B349 Viral infection, unspecified: Secondary | ICD-10-CM

## 2018-11-27 DIAGNOSIS — R6889 Other general symptoms and signs: Secondary | ICD-10-CM

## 2018-11-27 DIAGNOSIS — Z20828 Contact with and (suspected) exposure to other viral communicable diseases: Secondary | ICD-10-CM | POA: Diagnosis not present

## 2018-11-27 DIAGNOSIS — Z20822 Contact with and (suspected) exposure to covid-19: Secondary | ICD-10-CM

## 2018-11-27 NOTE — Progress Notes (Signed)
Telemedicine Encounter- SOAP NOTE Established Patient  This telephone encounter was conducted with the patient's (or proxy's) verbal consent via audio telecommunications: yes/no: Yes Patient was instructed to have this encounter in a suitably private space; and to only have persons present to whom they give permission to participate. In addition, patient identity was confirmed by use of name plus two identifiers (DOB and address).  I discussed the limitations, risks, security and privacy concerns of performing an evaluation and management service by telephone and the availability of in person appointments. I also discussed with the patient that there may be a patient responsible charge related to this service. The patient expressed understanding and agreed to proceed.  I spent a total of TIME; 0 MIN TO 60 MIN: 15 minutes talking with the patient or their proxy.  No chief complaint on file. COVID exposure  Terri Combs is a 23 y.o. female established patient. Telephone visit today for COVID concerns. Patient was exposed to coworker who tested positive.  Last night she is developed itchy throat, dry cough and chills.  Denies high fever, difficulty breathing, chest pain, wheezing, nausea, vomiting, diarrhea, or any other significant symptoms.  Requesting testing.  Also requesting a work note. Also complaining of irregular menstrual periods for the past several months.  Home pregnancy test negative.  HPI   There are no active problems to display for this patient.   Past Medical History:  Diagnosis Date  . Migraine with aura   . Scoliosis   . Vaginal yeast infection     Current Outpatient Medications  Medication Sig Dispense Refill  . SUMAtriptan (IMITREX) 50 MG tablet Take 1 tablet (50 mg total) by mouth every 2 (two) hours as needed for migraine. May repeat in 2 hours. Max of 2 tabs per day. 10 tablet 5  . meclizine (ANTIVERT) 12.5 MG tablet Take 1 tablet (12.5 mg total) by  mouth 3 (three) times daily as needed for dizziness. (Patient not taking: Reported on 11/27/2018) 30 tablet 0   No current facility-administered medications for this visit.     No Known Allergies  Social History   Socioeconomic History  . Marital status: Single    Spouse name: Not on file  . Number of children: 0  . Years of education: 40  . Highest education level: Not on file  Occupational History  . Occupation: Designer, multimedia  Social Needs  . Financial resource strain: Not on file  . Food insecurity    Worry: Never true    Inability: Never true  . Transportation needs    Medical: No    Non-medical: No  Tobacco Use  . Smoking status: Never Smoker  . Smokeless tobacco: Never Used  Substance and Sexual Activity  . Alcohol use: No  . Drug use: No  . Sexual activity: Yes    Partners: Male    Birth control/protection: None  Lifestyle  . Physical activity    Days per week: Not on file    Minutes per session: Not on file  . Stress: Not on file  Relationships  . Social Herbalist on phone: Not on file    Gets together: Not on file    Attends religious service: Not on file    Active member of club or organization: Not on file    Attends meetings of clubs or organizations: Not on file    Relationship status: Not on file  . Intimate partner violence  Fear of current or ex partner: Not on file    Emotionally abused: Not on file    Physically abused: Not on file    Forced sexual activity: Not on file  Other Topics Concern  . Not on file  Social History Narrative   Lives at home w/ her parents   Right-handed   Caffeine: soda daily    Review of Systems  Constitutional: Positive for chills. Negative for fever.  HENT: Positive for sore throat.   Respiratory: Positive for cough. Negative for shortness of breath.   Cardiovascular: Negative for chest pain and palpitations.  Gastrointestinal: Negative for abdominal pain, nausea and vomiting.  Genitourinary: Negative  for dysuria.  Musculoskeletal: Negative for back pain and myalgias.  Neurological: Negative for dizziness and headaches.  All other systems reviewed and are negative.   Objective   Vitals as reported by the patient: Today's Vitals   11/27/18 1631  Weight: 148 lb (67.1 kg)  Height: 5' 1.5" (1.562 m)  Alert and oriented x3 in no apparent respiratory distress.  There are no diagnoses linked to this encounter. Diagnoses and all orders for this visit:  Flu-like symptoms  Close Exposure to Covid-19 Virus -     Novel Coronavirus, N[facility] ([facility])  Viral illness  COVID instructions given.  Work note dictated and made available through my chart.  ED precautions given.  Isolation instructions given.  Advised to contact the office if clinical picture changes or symptoms worsen.   I discussed the assessment and treatment plan with the patient. The patient was provided an opportunity to ask questions and all were answered. The patient agreed with the plan and demonstrated an understanding of the instructions.   The patient was advised to call back or seek an in-person evaluation if the symptoms worsen or if the condition fails to improve as anticipated.  I provided 15 minutes of non-face-to-face time during this encounter.  Georgina QuintMiguel Jose Perl Kerney, MD  Primary Care at [facility]

## 2018-11-27 NOTE — Progress Notes (Signed)
Called patient to triage for appointment. Patient states she wants to be tested for [place], she was exposed to a co-worker positive with [place]. Patient states the she found out on 11/24/2018. Patient states 4 days ago she was coughing, chills and body ache. Patient states last night she had a itching in her throat. Also, patient states she is having irregular periods, the last one was 09/28/2018 and she took a home pregnancy test on 11/21/2018, which was negative.

## 2018-11-28 ENCOUNTER — Other Ambulatory Visit: Payer: Self-pay

## 2018-11-28 DIAGNOSIS — Z20822 Contact with and (suspected) exposure to covid-19: Secondary | ICD-10-CM

## 2018-11-29 NOTE — Telephone Encounter (Signed)
Schedule COVID testing.

## 2018-11-30 LAB — NOVEL CORONAVIRUS, NAA: SARS-CoV-2, NAA: NOT DETECTED

## 2018-12-01 ENCOUNTER — Encounter: Payer: Self-pay | Admitting: Emergency Medicine

## 2018-12-08 ENCOUNTER — Encounter: Payer: Self-pay | Admitting: Registered Nurse

## 2018-12-08 ENCOUNTER — Ambulatory Visit (INDEPENDENT_AMBULATORY_CARE_PROVIDER_SITE_OTHER): Payer: Managed Care, Other (non HMO) | Admitting: Registered Nurse

## 2018-12-08 ENCOUNTER — Telehealth: Payer: Self-pay | Admitting: Family Medicine

## 2018-12-08 ENCOUNTER — Other Ambulatory Visit: Payer: Self-pay

## 2018-12-08 VITALS — BP 114/72 | HR 77 | Temp 97.9°F | Resp 16 | Ht 61.81 in | Wt 147.0 lb

## 2018-12-08 DIAGNOSIS — N926 Irregular menstruation, unspecified: Secondary | ICD-10-CM

## 2018-12-08 DIAGNOSIS — R42 Dizziness and giddiness: Secondary | ICD-10-CM | POA: Diagnosis not present

## 2018-12-08 DIAGNOSIS — Z3201 Encounter for pregnancy test, result positive: Secondary | ICD-10-CM | POA: Diagnosis not present

## 2018-12-08 DIAGNOSIS — Z3490 Encounter for supervision of normal pregnancy, unspecified, unspecified trimester: Secondary | ICD-10-CM

## 2018-12-08 LAB — POCT URINALYSIS DIP (MANUAL ENTRY)
Bilirubin, UA: NEGATIVE
Glucose, UA: NEGATIVE mg/dL
Ketones, POC UA: NEGATIVE mg/dL
Leukocytes, UA: NEGATIVE
Nitrite, UA: NEGATIVE
Protein Ur, POC: NEGATIVE mg/dL
Spec Grav, UA: 1.025 (ref 1.010–1.025)
Urobilinogen, UA: 1 E.U./dL
pH, UA: 7 (ref 5.0–8.0)

## 2018-12-08 LAB — POCT URINE PREGNANCY: Preg Test, Ur: POSITIVE — AB

## 2018-12-08 NOTE — Patient Instructions (Addendum)
If you have lab work done today you will be contacted with your lab results within the next 2 weeks.  If you have not heard from [place] then please contact us. The fastest way to get your results is to register for My Chart.   IF you received an x-ray today, you will receive an invoice from [facility]. Please contact [facility] at [phone] with questions or concerns regarding your invoice.   IF you received labwork today, you will receive an invoice from [place]. Please contact [facility] at [phone] with questions or concerns regarding your invoice.   Our billing staff will not be able to assist you with questions regarding bills from these companies.  You will be contacted with the lab results as soon as they are available. The fastest way to get your results is to activate your My Chart account. Instructions are located on the last page of this paperwork. If you have not heard from [place] regarding the results in 2 weeks, please contact this office.      Morning Sickness  Morning sickness is when a woman feels nauseous during pregnancy. This nauseous feeling may or may not come with vomiting. It often occurs in the morning, but it can be a problem at any time of day. Morning sickness is most common during the first trimester. In some cases, it may continue throughout pregnancy. Although morning sickness is unpleasant, it is usually harmless unless the woman develops severe and continual vomiting (hyperemesis gravidarum), a condition that requires more intense treatment. What are the causes? The exact cause of this condition is not known, but it seems to be related to normal hormonal changes that occur in pregnancy. What increases the risk? You are more likely to develop this condition if:  You experienced nausea or vomiting before your pregnancy.  You had morning sickness during a previous pregnancy.  You are pregnant with more than one baby, such as  twins. What are the signs or symptoms? Symptoms of this condition include:  Nausea.  Vomiting. How is this diagnosed? This condition is usually diagnosed based on your signs and symptoms. How is this treated? In many cases, treatment is not needed for this condition. Making some changes to what you eat may help to control symptoms. Your health care provider may also prescribe or recommend:  Vitamin B6 supplements.  Anti-nausea medicines.  Ginger. Follow these instructions at home: Medicines  Take over-the-counter and prescription medicines only as told by your health care provider. Do not use any prescription, over-the-counter, or herbal medicines for morning sickness without first talking with your health care provider.  Taking multivitamins before getting pregnant can prevent or decrease the severity of morning sickness in most women. Eating and drinking  Eat a piece of dry toast or crackers before getting out of bed in the morning.  Eat 5 or 6 small meals a day.  Eat dry and bland foods, such as rice or a baked potato. Foods that are high in carbohydrates are often helpful.  Avoid greasy, fatty, and spicy foods.  Have someone cook for you if the smell of any food causes nausea and vomiting.  If you feel nauseous after taking prenatal vitamins, take the vitamins at night or with a snack.  Snack on protein foods between meals if you are hungry. Nuts, yogurt, and cheese are good options.  Drink fluids throughout the day.  Try ginger ale made with real ginger, ginger tea made from fresh grated ginger,  or ginger candies. General instructions  Do not use any products that contain nicotine or tobacco, such as cigarettes and e-cigarettes. If you need help quitting, ask your health care provider.  Get an air purifier to keep the air in your house free of odors.  Get plenty of fresh air.  Try to avoid odors that trigger your nausea.  Consider trying these methods to help  relieve symptoms: ? Wearing an acupressure wristband. These wristbands are often worn for seasickness. ? Acupuncture. Contact a health care provider if:  Your home remedies are not working and you need medicine.  You feel dizzy or light-headed.  You are losing weight. Get help right away if:  You have persistent and uncontrolled nausea and vomiting.  You faint.  You have severe pain in your abdomen. Summary  Morning sickness is when a woman feels nauseous during pregnancy. This nauseous feeling may or may not come with vomiting.  Morning sickness is most common during the first trimester.  It often occurs in the morning, but it can be a problem at any time of day.  In many cases, treatment is not needed for this condition. Making some changes to what you eat may help to control symptoms. This information is not intended to replace advice given to you by your health care provider. Make sure you discuss any questions you have with your health care provider. Document Released: 05/24/2006 Document Revised: 03/15/2017 Document Reviewed: 05/05/2016 Elsevier Patient Education  2020 [employer].  Pregnancy and Anemia  Anemia is a condition in which the concentration of red blood cells, or hemoglobin, in the blood is below normal. Hemoglobin is a substance in red blood cells that carries oxygen to the tissues of the body. Anemia results when enough oxygen does not reach these tissues. Anemia is common during pregnancy because the woman's body needs more blood volume and blood cells to provide nutrition to the fetus. The fetus needs iron and folic acid as it is developing. Your body may not produce enough red blood cells because of this. Also, during pregnancy, the liquid part of the blood (plasma) increases by about 30-50%, and the red blood cells increase by only 20%. This lowers the concentration of the red blood cells and creates a natural anemia-like situation. What are the  causes? The most common cause of anemia during pregnancy is not having enough iron in the body to make red blood cells (iron deficiency anemia). Other causes may include:  Folic acid deficiency.  Vitamin B12 deficiency.  Certain prescription or over-the-counter medicines.  Certain medical conditions or infections that destroy red blood cells.  A low platelet count and bleeding caused by antibodies that go through the placenta to the fetus from the mother's blood. What are the signs or symptoms? Mild anemia may not be noticeable. If it becomes severe, symptoms may include:  Feeling tired (fatigue).  Shortness of breath, especially during activity.  Weakness.  Fainting.  Pale looking skin.  Headaches.  A fast or irregular heartbeat (palpitations).  Dizziness. How is this diagnosed? This condition may be diagnosed based on:  Your medical history and a physical exam.  Blood tests. How is this treated? Treatment for anemia during pregnancy depends on the cause of the anemia. Treatment can include:  Dietary changes.  Supplements of iron, vitamin K08, or folic acid.  A blood transfusion. This may be needed if anemia is severe.  Hospitalization. This may be needed if there is a lot of blood loss or  severe anemia. Follow these instructions at home:  Follow recommendations from your dietitian or health care provider about changing your diet.  Increase your vitamin C intake. This will help the stomach absorb more iron. Some foods that are high in vitamin C include: ? Oranges. ? Peppers. ? Tomatoes. ? Mangoes.  Eat a diet rich in iron. This would include foods such as: ? Liver. ? Beef. ? Eggs. ? Whole grains. ? Spinach. ? Dried fruit.  Take iron and vitamins as told by your health care provider.  Eat green leafy vegetables. These are a good source of folic acid.  Keep all follow-up visits as told by your health care provider. This is important. Contact a  health care provider if:  You have frequent or lasting headaches.  You look pale.  You bruise easily. Get help right away if:  You have extreme weakness, shortness of breath, or chest pain.  You become dizzy or have trouble concentrating.  You have heavy vaginal bleeding.  You develop a rash.  You have bloody or black, tarry stools.  You faint.  You vomit up blood.  You vomit repeatedly.  You have abdominal pain.  You have a fever.  You are dehydrated. Summary  Anemia is a condition in which the concentration of red blood cells or hemoglobin in the blood is below normal.  Anemia is common during pregnancy because the woman's body needs more blood volume and blood cells to provide nutrition to the fetus.  The most common cause of anemia during pregnancy is not having enough iron in the body to make red blood cells (iron deficiency anemia).  Mild anemia may not be noticeable. If it becomes severe, symptoms may include feeling tired and weak. This information is not intended to replace advice given to you by your health care provider. Make sure you discuss any questions you have with your health care provider. Document Released: 03/30/2000 Document Revised: 07/25/2018 Document Reviewed: 05/08/2016 Elsevier Patient Education  2020 [employer].  Pregnancy and COVID-19 Coronavirus disease, also called COVID-19, is an infection of the lungs and airways (respiratory tract). It is unclear at this time if pregnancy makes it more likely for you to get COVID-19, or what effects the infection may have on your unborn baby. However, pregnancy causes changes to your heart, lungs, and the body's disease-fighting system (immune system). Some of these changes make it more likely for you to get sick and have more serious illness. Therefore, it is important for you to take precautions in order to protect yourself and your unborn baby. Work with your health care team to develop a plan to  protect yourself from all infections, including COVID-19. This is one way for you to stay healthy during your pregnancy and to keep your baby healthy as well. How does this affect me? If you get COVID-19, there is a risk that you may:  Get a respiratory illness that can lead to pneumonia.  Give birth to your baby before 37 weeks of pregnancy (premature birth). If you have or may have COVID-19, your health care provider may recommend special precautions around your pregnancy. This may affect how you:  Receive care before delivery (prenatal care). How you visit your health care provider may change. Tests and scans may need to be performed differently.  Receive care during labor and delivery. This may affect your birth plan, including who may be with you during labor and delivery.  Receive care after you deliver your baby (postpartum care).  You may stay longer in the hospital, and in a special room. Your baby may also need to stay away from you.  Feed your baby after he or she is born. Pregnancy can be an especially stressful time because of the changes in your body and the preparation involved in becoming a parent. In addition, you may be feeling especially fearful, anxious, or stressed because of COVID-19 and how it is affecting you. How does this affect my baby? It is not known whether a mother will transmit the virus to her unborn baby. There is a risk that if you get COVID-19:  The virus that causes COVID-19 can pass to your baby.  You may have premature birth. Your baby may require more medical care if this happens. What can I do to lower my risk?  There is no vaccine to help prevent COVID-19. However, there are actions that you can take to protect yourself and others from this virus. Cleaning and personal hygiene  Wash your hands often with soap and water for at least 20 seconds. If soap and water are not available, use alcohol-based hand sanitizer.  Avoid touching your mouth, face,  eyes, or nose.  Clean and disinfect objects and surfaces that are frequently touched every day. This may include: ? Counters and tables. ? Doorknobs and light switches. ? Sinks and faucets. ? Electronics such as phones, remote controls, keyboards, computers, and tablets. Stay away from others  Stay away from people who are sick, if possible.  Avoid social gatherings and travel.  Stay home as much as possible. Follow these instructions: Breastfeeding It is not known if the virus that causes COVID-19 can pass through breast milk to your baby. You should make a plan for feeding your infant with your family and your health care team. If you have or may have COVID-19, your health care provider may recommend that you take precautions while breastfeeding, such as:  Washing your hands before feeding your baby.  Wearing a mask while feeding your baby.  Pumping or expressing breast milk to feed to your baby. If possible, ask someone in your household who is not sick to feed your baby the expressed breast milk. ? Wash your hands before touching pump parts. ? Wash and disinfect all pump parts after expressing milk. Follow the manufacturer's instructions to clean and disinfect all pump parts. General instructions  If you think you have a COVID-19 infection, contact your health care provider right away. Tell your health care provider that you think you may have a COVID-19 infection.  Follow your health care provider's instructions on taking medicines. Some medicines may be unsafe to take during pregnancy.  Cover your mouth and nose by wearing a mask or other cloth covering over your face when you go out in public.  Find ways to manage stress. These may include: ? Using relaxation techniques like meditation and deep breathing. ? Getting regular exercise. Most women can continue their usual exercise routine during pregnancy. Ask your health care provider what activities are safe for  you. ? Seeking support from family, friends, or spiritual resources. If you cannot be together in person, you can still connect by phone calls, texts, video calls, or online messaging. ? Spending time doing relaxing activities that you enjoy, like listening to music or reading a good book.  Ask for help if you have counseling or nutritional needs during pregnancy. Your health care provider can offer advice or refer you to resources or specialists who can help  you with various needs.  Keep all follow-up visits as told by your health care provider. This is important. Where to find more information Centers for Disease Control and Prevention (CDC): http://gutierrez-robinson.com/ World Health Organization [facility]): https://www.morales.com/ [employer] (ACOG): StickerEmporium.tn Questions to ask your health care team  What should I do if I have COVID-19 symptoms?  How will COVID-19 affect my prenatal care visits, tests and scans, labor and delivery, and postpartum care?  Should I plan to breastfeed my baby?  Where can I find mental health resources?  Where can I find support if I have financial concerns? Contact a health care provider if:  You have signs and symptoms of infection, including a fever or cough. Tell your health care team that you think you may have a COVID-19 infection.  You have strong emotions, such as sadness or anxiety.  You feel unsafe in your home and need help finding a safe place to live.  You have bloody or watery vaginal discharge or vaginal bleeding. Get help right away if:  You have signs or symptoms of labor before 37 weeks of pregnancy. These include: ? Contractions that are 5 minutes or less apart, or that increase in frequency, intensity, or length. ? Sudden, sharp pain in the abdomen or in  the lower back. ? A gush or trickle of fluid from your vagina.  You have difficulty breathing.  You have chest pain. These symptoms may represent a serious problem that is an emergency. Do not wait to see if the symptoms will go away. Get medical help right away. Call your local emergency services (911 in the [place] Do not drive yourself to the hospital. Summary  Coronavirus disease, also called COVID-19, is an infection of the lungs and airways (respiratory tract). It is unclear at this time if pregnancy makes you more susceptible to COVID-19 and what effects it may have on unborn babies.  It is important to take precautions to protect yourself and your developing baby. This includes washing your hands often, avoiding touching your mouth, face, eyes, or nose, avoiding social gatherings and travel, and staying away from people who are sick.  If you think you have a COVID-19 infection, contact your health care provider right away. Tell your health care provider that you think you may have a COVID-19 infection.  If you have or may have COVID-19, your health care provider may recommend special precautions during your pregnancy, labor and delivery, and after your baby is born. This information is not intended to replace advice given to you by your health care provider. Make sure you discuss any questions you have with your health care provider. Document Released: 07/29/2018 Document Revised: 07/29/2018 Document Reviewed: 07/29/2018 Elsevier Patient Education  [employer].  Pregnancy and Sex Your sex life may change during pregnancy as well as after your newborn arrives. It is normal to have questions about sex during pregnancy. All women are affected differently by pregnancy hormones. You may notice an increase or decrease in your sexual drive throughout your pregnancy. Also, your partner's attitude and sexual drive may change. Share the information in this document with your partner. Talk  openly about how you feel about sex. When is it safe to have sex during pregnancy? Sex is generally considered safe throughout a normal low-risk pregnancy. Remember:  The fetus is protected by the uterus and the fluid-filled sac that surrounds the fetus (amniotic sac).  The cervix is closed or sealed during pregnancy.  The  penis does not reach or harm the fetus during sex.  Sex and orgasms are not thought to cause miscarriages or early labor.  If you use lubricants, use a water-soluble product. What risk factors make it unsafe to have sex while pregnant? The following complications or risk factors may make it necessary to limit sexual activity:  You have a history of miscarriage or preterm labor.  You have bleeding, discharge, fluid leakage, or contractions.  Your placenta may be partially covering or completely covering the opening to the cervix (placenta previa).  Your cervix is weak and opens easily (incompetent cervix).  Your partner has an STD (sexually transmitted disease). Avoid sex with the infected person or use a condom to prevent infection to the fetus.  You are unsure of your partner's sexual history. Avoid sex or use condoms.  You are having twins, triples, or other multiples. Your health care provider will help you determine whether sex during your pregnancy is safe. What practices are unsafe?  If you engage in oral sex, you should avoid having your partner blow air into your vagina. Although very rare, this can send a dangerous air bubble into your bloodstream.  Anal sex is generally safe during pregnancy, but there can be a risk of spreading bacteria from the rectum and aggravating any hemorrhoids. This information is not intended to replace advice given to you by your health care provider. Make sure you discuss any questions you have with your health care provider. Document Released: 09/20/2009 Document Revised: 07/25/2018 Document Reviewed: 09/22/2015 Elsevier  Patient Education  2020 [employer].  Pregnancy and the Partner's Role  You have an important role during your partner's pregnancy, labor, and delivery. To be helpful and supportive during this time, you should know what to expect. What are the stages of pregnancy? Pregnancy usually lasts for about 40 weeks. The pregnancy period is divided into 3 trimesters. First trimester (0-13 weeks) During this trimester, your partner may:  Feel tired.  Have painful breasts.  Feel nauseous and vomit.  Urinate more often.  Have mood changes. All of these changes are normal. Try to be helpful, supportive, and understanding. This may include helping with household chores and spending more time with each other. Second trimester (14-28 weeks) During this trimester:  Your partner may feel better and more energetic. This is the best time to be more active together.  You will be able to see her belly showing the pregnancy.  You may be able to feel the baby kick.  Your partner may have soreness or aching in her back as she gains weight. You can help by carrying heavy things and by rubbing her back when she is feeling sore. Third trimester (29-40 weeks) During the final weeks, your partner may:  Become more uncomfortable as the baby grows.  Have a hard time doing everyday activities, and her balance may be off.  Have a hard time bending over.  Get tired easily.  Have difficulty sleeping. At this time, the birth of your baby is close. You and your partner may have concerns or questions. This is normal. Talk with each other and with your health care provider. Continue to help your partner with household chores, encourage her to rest, and rub her sore back and legs, if this helps her. How to support your partner during pregnancy There are many things you can do to support your partner during her pregnancy. Emotional support During your partner's pregnancy, you and your partner may:  Feel  happy, excited, or proud.  Have concerns about finances or other new responsibilities.  Feel overwhelmed or scared.  Worry about changes in your relationship with your partner. These feelings are normal. Talk about them openly with your partner, trusted family members, other new parents, or your health care provider. Find ways to relax and manage stress together, such as taking a walk or listening to music. Prenatal care Attend prenatal care visits with your partner. This is a good time for you to get to know your health care provider, follow the pregnancy, and ask questions. You may have prenatal visits:  Once a month for the first 6 months.  Once every 2 weeks from 6-8 months.  Once a week during the last month.  You may have more prenatal visits if your health care provider believes that they are needed. Sex and intimacy It's important to stay connected with your partner during pregnancy, since your relationship may experience changes once baby arrives. Sexual intercourse is safe unless there is a problem with the pregnancy and your health care provider advises you not to have sex. Your partner may not want to have sex during certain times due to pregnancy-related physical and emotional changes. To continue to be intimate:  Discuss your feelings and desires.  Try different positions to make sex more comfortable.  Find other ways to be intimate, such as massage.  Talk with your health care provider about any questions that you may have about sexual intercourse during pregnancy. Childbirth classes Attend childbirth classes with your partner if you are able. Classes help you and your partner bond by preparing for labor and delivery. Classes teach you:  What happens during labor and delivery.  How to emotionally and physically support your partner.  Relaxation techniques.  How to work through your partner's labor pains.  How to focus during labor and delivery. Follow these  instructions at home:  Talk with your partner about her preferences for labor and delivery. If you plan to be present during labor and delivery, talk about: ? Her pain management preferences. You can help manage pain with massage and positioning your partner during labor. You can also help advocate for her preferences. ? How the baby will be delivered. Depending on your partner's health and pregnancy history, she may be able to attempt a vaginal delivery or may need to schedule a cesarean delivery, or C-section. ? If you would like to help with the delivery or cut your baby's umbilical cord.  Find ways to support your partner's health during pregnancy: ? Eat a healthy, well-balanced diet with your partner. ? Exercise with your partner. Exercising regularly during pregnancy can boost your partner's mood, reduce pain and swelling, and may even help prepare your partner to give birth. ? Make sure you are up to date on all recommended vaccines like the annual flu shot. ? Do not smoke or use e-cigarettes around your partner. When your partner breathes in smoke, the harmful substances that are inhaled can pass to your baby through the placenta. Summary  To be helpful and supportive during your partner's pregnancy, you can provide emotional support, find ways to be intimate with your partner, and attend prenatal care visits and childbirth classes.  Attending prenatal care visits with your partner helps you get to know your health care provider, follow the pregnancy, and ask questions.  It is important to talk with your partner and your health care provider if you are worried or have any questions.  Talk with  your partner about her preferences for labor and delivery. Be prepared to be an advocate for her. This information is not intended to replace advice given to you by your health care provider. Make sure you discuss any questions you have with your health care provider. Document Released: 09/19/2007  Document Revised: 07/25/2018 Document Reviewed: 04/15/2017 Elsevier Patient Education  2020 [employer].  Pregnancy and Vaccinations Vaccines can help to keep you healthy. There are some vaccines that should be given before pregnancy and some that should be given during pregnancy. How does this affect me? If you are pregnant or thinking about getting pregnant, talk with your health care provider about what vaccines are right for you. How does this affect my baby? Usually, the benefits of receiving vaccines during pregnancy outweigh the risks of harm to you or your baby if:  The risk of being exposed to a disease is high.  Infection would pose a risk to you or your unborn baby.  The vaccine is not likely to cause harm. Vaccines can help protect your baby from some diseases until he or she is old enough to get the vaccine. What can I do to lower my risk? When you receive the recommended vaccines, it helps to protect you from getting certain diseases and passing them on to your baby. Should I receive vaccines before pregnancy? If possible, make sure that your vaccines are up to date before you become pregnant.  It is safe and important for you to receive weakened viral and weakened bacterial vaccines (inactivated vaccines) as needed before you are pregnant.  Live viral and live bacterial vaccines, such as the measles, mumps, and rubella (MMR) vaccine, should be given 1 month or more before pregnancy. Sometimes, women become pregnant within 1 month of receiving a live vaccine that is not usually recommended during pregnancy. The [place] Centers for Disease Control and Prevention (CDC) has reported that when this has happened, vaccines have not harmed pregnant women or their unborn babies. Should I receive vaccines during pregnancy?  It is safe and important for you to receive some inactivated vaccines as needed during pregnancy. Until your baby can receive vaccines, your baby will get some  protection from diseases through the vaccines that you receive while you are pregnant. During your pregnancy, you should receive the following:  Influenza vaccine (the flu shot). ? The flu shot may protect you and your baby (up to 32 months of age) from some complications associated with strains of influenza that are covered by the vaccine. ? Pregnant women can receive the flu shot at any time during pregnancy.  Tetanus, diphtheria, and pertussis (Tdap) vaccine. ? The Tdap vaccine will help to prevent whooping cough (pertussis) in you and your baby. ? You should receive 1 dose of this vaccine during each pregnancy. It is recommended that pregnant women receive this vaccine between 27 and 36 weeks of pregnancy. Should I receive vaccines after pregnancy?  It is safe and important for you to receive vaccines as needed after pregnancy. Some are safe to have if you are breastfeeding. Other vaccines may not be safe to have until after you have stopped breastfeeding.  If you did not receive the Tdap vaccine during your pregnancy and have never received a Tdap vaccine, you should receive that vaccine right after you give birth to your baby (delivery).  If you are not immune to measles, mumps, rubella, or chickenpox (varicella), you should receive those vaccines within days after delivery.  It is important  to talk with your health care provider about what vaccines you may need after delivery. What if I am pregnant and I plan to travel internationally? If you are pregnant and you are planning to travel internationally, talk with your health care provider at least 4-6 weeks before your trip. Depending on the country you are planning to visit, you may need to take special precautions or get certain vaccines to prevent disease. Vaccines that may be recommended for pregnant international travelers include:  Influenza (the flu shot).  Tetanus and diphtheria (Td) or Tdap.  Hepatitis B (HepB).  Hepatitis  A (HepA). Your health care provider can help you decide if you need vaccines and if the benefits outweigh the risk of disease exposure. Follow these instructions at home:  Take over-the-counter and prescription medicines as told by your health care provider.  Keep all follow-up visits as told by your health care provider. This is important. Questions to ask your health care provider:  What vaccines are safe during pregnancy?  What are the risks of vaccines during pregnancy?  What are the potential side effects of vaccines during or after pregnancy?  When should I get vaccines during pregnancy? Contact a health care provider if you:  Believe you have had a reaction to a vaccine.  Have concerns or questions about a vaccine.  Become pregnant within 1 month after you have received a live vaccine. Summary  Vaccines are the most effective way to prevent certain diseases.  Many vaccines are safe to receive during pregnancy.  Some vaccines are recommended during pregnancy to protect you and your baby from getting sick.  If you are pregnant or planning to become pregnant, talk with your health care provider about what vaccines are right for you. This information is not intended to replace advice given to you by your health care provider. Make sure you discuss any questions you have with your health care provider. Document Released: 04/22/2007 Document Revised: 07/25/2018 Document Reviewed: 05/07/2018 Elsevier Patient Education  2020 [employer].  Prenatal Care Prenatal care is health care during pregnancy. It helps you and your unborn baby (fetus) stay as healthy as possible. Prenatal care may be provided by a midwife, a family practice health care provider, or a childbirth and pregnancy specialist (obstetrician). How does this affect me? During pregnancy, you will be closely monitored for any new conditions that might develop. To lower your risk of pregnancy complications, you and  your health care provider will talk about any underlying conditions you have. How does this affect my baby? Early and consistent prenatal care increases the chance that your baby will be healthy during pregnancy. Prenatal care lowers the risk that your baby will be:  Born early (prematurely).  Smaller than expected at birth (small for gestational age). What can I expect at the first prenatal care visit? Your first prenatal care visit will likely be the longest. You should schedule your first prenatal care visit as soon as you know that you are pregnant. Your first visit is a good time to talk about any questions or concerns you have about pregnancy. At your visit, you and your health care provider will talk about:  Your medical history, including: ? Any past pregnancies. ? Your family's medical history. ? The baby's father's medical history. ? Any long-term (chronic) health conditions you have and how you manage them. ? Any surgeries or procedures you have had. ? Any current over-the-counter or prescription medicines, herbs, or supplements you are taking.  Other  factors that could pose a risk to your baby, including:  Your home setting and your stress levels, including: ? Exposure to abuse or violence. ? Household financial strain. ? Mental health conditions you have.  Your daily health habits, including diet and exercise. Your health care provider will also:  Measure your weight, height, and blood pressure.  Do a physical exam, including a pelvic and breast exam.  Perform blood tests and urine tests to check for: ? Urinary tract infection. ? Sexually transmitted infections (STIs). ? Low iron levels in your blood (anemia). ? Blood type and certain proteins on red blood cells (Rh antibodies). ? Infections and immunity to viruses, such as hepatitis B and rubella. ? HIV (human immunodeficiency virus).  Do an ultrasound to confirm your baby's growth and development and to help  predict your estimated due date (EDD). This ultrasound is done with a probe that is inserted into the vagina (transvaginal ultrasound).  Discuss your options for genetic screening.  Give you information about how to keep yourself and your baby healthy, including: ? Nutrition and taking vitamins. ? Physical activity. ? How to manage pregnancy symptoms such as nausea and vomiting (morning sickness). ? Infections and substances that may be harmful to your baby and how to avoid them. ? Food safety. ? Dental care. ? Working. ? Travel. ? Warning signs to watch for and when to call your health care provider. How often will I have prenatal care visits? After your first prenatal care visit, you will have regular visits throughout your pregnancy. The visit schedule is often as follows:  Up to week 28 of pregnancy: once every 4 weeks.  28-36 weeks: once every 2 weeks.  After 36 weeks: every week until delivery. Some women may have visits more or less often depending on any underlying health conditions and the health of the baby. Keep all follow-up and prenatal care visits as told by your health care provider. This is important. What happens during routine prenatal care visits? Your health care provider will:  Measure your weight and blood pressure.  Check for fetal heart sounds.  Measure the height of your uterus in your abdomen (fundal height). This may be measured starting around week 20 of pregnancy.  Check the position of your baby inside your uterus.  Ask questions about your diet, sleeping patterns, and whether you can feel the baby move.  Review warning signs to watch for and signs of labor.  Ask about any pregnancy symptoms you are having and how you are dealing with them. Symptoms may include: ? Headaches. ? Nausea and vomiting. ? Vaginal discharge. ? Swelling. ? Fatigue. ? Constipation. ? Any discomfort, including back or pelvic pain. Make a list of questions to ask your  health care provider at your routine visits. What tests might I have during prenatal care visits? You may have blood, urine, and imaging tests throughout your pregnancy, such as:  Urine tests to check for glucose, protein, or signs of infection.  Glucose tests to check for a form of diabetes that can develop during pregnancy (gestational diabetes mellitus). This is usually done around week 24 of pregnancy.  An ultrasound to check your baby's growth and development and to check for birth defects. This is usually done around week 20 of pregnancy.  A test to check for group B strep (GBS) infection. This is usually done around week 36 of pregnancy.  Genetic testing. This may include blood or imaging tests, such as an ultrasound. Some genetic  tests are done during the first trimester and some are done during the second trimester. What else can I expect during prenatal care visits? Your health care provider may recommend getting certain vaccines during pregnancy. These may include:  A yearly flu shot (annual influenza vaccine). This is especially important if you will be pregnant during flu season.  Tdap (tetanus, diphtheria, pertussis) vaccine. Getting this vaccine during pregnancy can protect your baby from whooping cough (pertussis) after birth. This vaccine may be recommended between weeks 27 and 36 of pregnancy. Later in your pregnancy, your health care provider may give you information about:  Childbirth and breastfeeding classes.  Choosing a health care provider for your baby.  Umbilical cord banking.  Breastfeeding.  Birth control after your baby is born.  The hospital labor and delivery unit and how to tour it.  Registering at the hospital before you go into labor. Where to find more information  Office on Women's Health: LegalWarrants.gl  American Pregnancy Association: americanpregnancy.org  March of Dimes: marchofdimes.org Summary  Prenatal care helps you and your  baby stay as healthy as possible during pregnancy.  Your first prenatal care visit will most likely be the longest.  You will have visits and tests throughout your pregnancy to monitor your health and your baby's health.  Bring a list of questions to your visits to ask your health care provider.  Make sure to keep all follow-up and prenatal care visits with your health care provider. This information is not intended to replace advice given to you by your health care provider. Make sure you discuss any questions you have with your health care provider. Document Released: 04/05/2003 Document Revised: 07/23/2018 Document Reviewed: 04/01/2017 Elsevier Patient Education  2020 [employer].  Questions to [facility] Provider During Pregnancy  During pregnancy, you will go through a lot of changes and may have many new experiences and emotions. It is natural to have many questions. Asking questions can help you prepare for what happens during pregnancy, during labor and delivery, and after delivery (postpartum). Your pregnancy health care provider is a good resource for reliable answers. Schedule an appointment with your health care provider if you are planning to get pregnant (preconception visit) or as soon as you know you are pregnant (prenatal visit). New questions will come up as your pregnancy progresses. Write them down before each prenatal visit. This will help ensure that you get the information you need. Questions to ask about pregnancy After you find out that you are pregnant, you will want to learn more about what to expect from your prenatal visits and the different screening tests you may need. You also need to understand how to best take care of yourself to help ensure a healthy pregnancy. In addition, you will want to know if you are at risk for complications and what problems you should watch for. Here are some common questions to ask your health care provider: Prenatal visits  and tests  How often will I come in for my prenatal visits?  Should I have a prenatal dental checkup?  What type of screening tests should I consider? What are the risks and benefits of these tests?  What type of routine exams are suggested and when?  When would you recommend an ultrasound, and what will it show?  Is there a nurse line I can call if I have questions? Caring for yourself during pregnancy  What is considered a healthy weight for me?  What kinds of  foods should I eat? Are there any I should avoid? What if I am on a special diet? Do I need supplements?  When should I start taking my prenatal vitamins? What vitamins do you recommend?  What kind of exercise should I get and how much?  How much sleep is recommended now that I am pregnant?  What kinds of things can I do to reduce stress and anxiety?  Are there any travel restrictions?  Is it okay to have sex?  What types of substances should I avoid being exposed to? What if I am exposed at work?  Why is it important not to smoke or drink alcohol? What about other drugs? Should I limit how much caffeine I get?  Where can I get help if I am struggling to quit smoking, drinking, or taking drugs?  Which medicines could be dangerous to take during pregnancy? How about supplements or herbal medicines?  Are there vaccinations I should definitely get or avoid? Complications  Do I have any medical conditions that will need special treatment during pregnancy?  What can I do if I have morning sickness?  What complications should I look out for? What are the symptoms? When should I call?  What if I have bleeding? How much bleeding is normal?  What symptoms indicate a possible emergency? What should I do if I have any? Questions to ask about labor and delivery As you move through your pregnancy, you will start to think more about labor and delivery. To help make sure you understand your options, ask your health care  provider questions such as:  What are my options for labor and delivery?  Should I create a birth plan? Can you give me some guidance? When is a good time to start working on this plan?  Where can I have my baby? Is a birth center or home birth an option for me?  What is natural childbirth? How do I learn more about it and prepare for it?  What are my options for pain management? What are the risks and benefits of drugs to manage pain? What methods other than medicine might be used to help manage pain?  When would a cesarean delivery-often called a C-section-be advised?  Can I have a vaginal birth if I had a cesarean delivery in the past? How can I prepare for this?  What is an episiotomy, and when might I need one?  When would you recommend induced labor? Questions to ask about what happens after delivery While you are still pregnant, you can begin to think about questions that you may have after your baby is born. Here are a few questions that you may want to ask:  How long will I need to stay in the hospital if I have a vaginal birth? What about if I have a cesarean delivery?  Will I be in the same room as my baby (also called rooming-in)?  What are the benefits of breastfeeding?  When should I start preparing for breastfeeding? Can I take a class to prepare?  How do I get back in shape after pregnancy? When can I exercise and have sex again?  How soon can I get pregnant after giving birth? Should I use birth control?  What is postpartum depression? What are the symptoms? Summary  Getting reliable answers to your questions during pregnancy is important to help you feel reassured and prepared.  Your health care provider can be a valuable resource.  Ask how you can  prepare for and have a healthy pregnancy.  Ask about what to expect during pregnancy, labor, delivery, and after your baby is born (postpartum).  Be sure you know what signs could indicate a possible  problem. This information is not intended to replace advice given to you by your health care provider. Make sure you discuss any questions you have with your health care provider. Document Released: 01/16/2017 Document Revised: 01/16/2017 Document Reviewed: 01/16/2017 Elsevier Patient Education  [place].  Signs and Symptoms of Labor Labor is your body's natural process of moving your baby, placenta, and umbilical cord out of your uterus. The process of labor usually starts when your baby is full-term, between 25 and 40 weeks of pregnancy. How will I know when I am close to going into labor? As your body prepares for labor and the birth of your baby, you may notice the following symptoms in the weeks and days before true labor starts:  Having a strong desire to get your home ready to receive your new baby. This is called nesting. Nesting may be a sign that labor is approaching, and it may occur several weeks before birth. Nesting may involve cleaning and organizing your home.  Passing a small amount of thick, bloody mucus out of your vagina (normal bloody show or losing your mucus plug). This may happen more than a week before labor begins, or it might occur right before labor begins as the opening of the cervix starts to widen (dilate). For some women, the entire mucus plug passes at once. For others, smaller portions of the mucus plug may gradually pass over several days.  Your baby moving (dropping) lower in your pelvis to get into position for birth (lightening). When this happens, you may feel more pressure on your bladder and pelvic bone and less pressure on your ribs. This may make it easier to breathe. It may also cause you to need to urinate more often and have problems with bowel movements.  Having "practice contractions" (Braxton Hicks contractions) that occur at irregular (unevenly spaced) intervals that are more than 10 minutes apart. This is also called false labor. False  labor contractions are common after exercise or sexual activity, and they will stop if you change position, rest, or drink fluids. These contractions are usually mild and do not get stronger over time. They may feel like: ? A backache or back pain. ? Mild cramps, similar to menstrual cramps. ? Tightening or pressure in your abdomen. Other early symptoms that labor may be starting soon include:  Nausea or loss of appetite.  Diarrhea.  Having a sudden burst of energy, or feeling very tired.  Mood changes.  Having trouble sleeping. How will I know when labor has begun? Signs that true labor has begun may include:  Having contractions that come at regular (evenly spaced) intervals and increase in intensity. This may feel like more intense tightening or pressure in your abdomen that moves to your back. ? Contractions may also feel like rhythmic pain in your upper thighs or back that comes and goes at regular intervals. ? For first-time mothers, this change in intensity of contractions often occurs at a more gradual pace. ? Women who have given birth before may notice a more rapid progression of contraction changes.  Having a feeling of pressure in the vaginal area.  Your water breaking (rupture of membranes). This is when the sac of fluid that surrounds your baby breaks. When this happens, you will notice fluid leaking  from your vagina. This may be clear or blood-tinged. Labor usually starts within 24 hours of your water breaking, but it may take longer to begin. ? Some women notice this as a gush of fluid. ? Others notice that their underwear repeatedly becomes damp. Follow these instructions at home:   When labor starts, or if your water breaks, call your health care provider or nurse care line. Based on your situation, they will determine when you should go in for an exam.  When you are in early labor, you may be able to rest and manage symptoms at home. Some strategies to try at home  include: ? Breathing and relaxation techniques. ? Taking a warm bath or shower. ? Listening to music. ? Using a heating pad on the lower back for pain. If you are directed to use heat:  Place a towel between your skin and the heat source.  Leave the heat on for 20-30 minutes.  Remove the heat if your skin turns bright red. This is especially important if you are unable to feel pain, heat, or cold. You may have a greater risk of getting burned. Get help right away if:  You have painful, regular contractions that are 5 minutes apart or less.  Labor starts before you are [redacted] weeks along in your pregnancy.  You have a fever.  You have a headache that does not go away.  You have bright red blood coming from your vagina.  You do not feel your baby moving.  You have a sudden onset of: ? Severe headache with vision problems. ? Nausea, vomiting, or diarrhea. ? Chest pain or shortness of breath. These symptoms may be an emergency. If your health care provider recommends that you go to the hospital or birth center where you plan to deliver, do not drive yourself. Have someone else drive you, or call emergency services (911 in the [place] Summary  Labor is your body's natural process of moving your baby, placenta, and umbilical cord out of your uterus.  The process of labor usually starts when your baby is full-term, between 59 and 40 weeks of pregnancy.  When labor starts, or if your water breaks, call your health care provider or nurse care line. Based on your situation, they will determine when you should go in for an exam. This information is not intended to replace advice given to you by your health care provider. Make sure you discuss any questions you have with your health care provider. Document Released: 09/07/2016 Document Revised: 12/31/2016 Document Reviewed: 09/07/2016 Elsevier Patient Education  [place] are streaks or lines that appear  on the skin because your skin stretched too quickly. They may show up on your stomach, buttocks, breasts, or thighs. The streaks or lines may feel itchy and appear pink, red, or purple. Over time, the stretch marks may lose their color and look white or silvery. Stretch marks do not go away, but they can fade or become less noticeable with time or with treatment. Women get stretch marks more often than men. They can also run in the family. Stretch marks are most common in:  Pregnant women.  People who gain weight quickly.  Boys and girls going through puberty.  People who use steroid creams or take steroid drugs.  Bodybuilders who build muscles too quickly.  People with certain conditions, such as Cushing's syndrome or Marfan syndrome. Follow these instructions at home:  General instructions  Use skin lotion,  creams, or gels to moisturize your skin. Massage them into your skin. Try to moisturize often.  Use prescribed cream or lotions as told by your health care provider.  Eat a healthy and balanced diet with enough vitamins and minerals to keep your skin healthy.  Talk with your health care provider about treatments that can help fade stretch marks, if needed. Ways to lower your chances of getting stretch marks  Maintain a healthy weight and avoid losing or gaining weight quickly.  If you are pregnant, work with your health care provider to make sure that your weight gain is in a healthy range.  Avoid bulking up too quickly if you are exercising to build muscle.  Take steroids and use steroid creams only as told by your health care provider. Contact a health care provider if:  You would like to know what treatment options are available to help fade your stretch marks. Summary  Stretch marks are streaks or lines that appear on your skin because the skin stretched too quickly. They may show up on your stomach, buttocks, breasts, or thighs.  Stretch marks do not go away, but  they may fade with time or treatment.  Talk with your health care provider about treatments that can help fade stretch marks, if needed. This information is not intended to replace advice given to you by your health care provider. Make sure you discuss any questions you have with your health care provider. Document Released: 10/28/2013 Document Revised: 03/15/2017 Document Reviewed: 01/23/2017 Elsevier Patient Education  2020 [employer].  Tests and Screening During Pregnancy Having certain tests and screenings during pregnancy is an important part of your prenatal care. These tests help your health care provider find problems that might affect your pregnancy. Some tests are done for all pregnant women, and some are optional. Most of the tests and screenings do not pose any risks for you or your baby. You may need additional testing if any routine tests indicate a problem. Tests and screenings done in early pregnancy Some tests and screenings you can expect to have in early pregnancy include:  Blood tests, such as: ? Complete blood count (CBC). This test is done to check your red and white blood cells. It can help identify a risk for anemia, infection, or bleeding. ? Blood typing. This test determines your blood type as well as whether you have a certain protein in your red blood cells (Rh factor). If you do not have this protein (Rh negative) and your baby does have it (Rh positive), your body could make antibodies to the Rh factor. This could be dangerous to your baby's health. ? Tests to check for diseases that can cause birth defects or can be passed to your baby, such as:  [place] measles (rubella). The test indicates whether you are immune to rubella.  Hepatitis B and C. All women are tested for hepatitis B. You may also be tested for hepatitis C if you have risk factors for the condition.  Zika virus infection. You may have a blood or urine test to check for this infection if you or your  partner has traveled to an area where the virus occurs.  Urine testing. A urine sample can be tested for diabetes, protein in your urine, and signs of infection.  Testing for sexually transmitted infections (STIs), such as HIV, syphilis, and chlamydia.  Testing for tuberculosis. You may have this skin test if you are at risk for tuberculosis.  Fetal ultrasound. This is  an imaging study of your developing baby. It is done using sound waves and a computer. This test may be done at 11-14 weeks to confirm your pregnancy and help determine your due date. Tests and screenings done later in pregnancy Certain tests are done for the first time during later pregnancy. In addition, some of the tests that were done in early pregnancy are repeated at this time. Some common tests you can expect to have later in pregnancy include:  Rh antibody testing. If you are Rh negative, you will have a blood test at about 28 weeks of pregnancy to see if you are producing Rh antibodies. If you have not started to make antibodies, you will be given an injection to prevent you from making antibodies for the rest of your pregnancy.  Glucose screening. This tests your blood sugar to find out whether you are developing the type of diabetes that occurs during pregnancy (gestational diabetes). You may have this screening earlier if you have risk factors for diabetes.  Screening for group B streptococcus (GBS). GBS is a type of bacteria that may live in your rectum or vagina. You may have GBS without any symptoms. GBS can spread to your baby during birth. This test involves doing a rectal and vaginal swab at 35-37 weeks of pregnancy. If testing is positive for GBS, you may be treated with antibiotic medicine.  CBC to check for anemia and blood-clotting ability.  Urine tests to check for protein, which can be a sign of a condition called preeclampsia.  Fetal ultrasound. This may be repeated at 16-20 weeks to check how your baby  is growing and developing. Screening for birth defects Some birth defects are caused by abnormal genes passed down through families. Early in your pregnancy, tests can be done to find out if your baby is at risk for a genetic disorder. This testing is optional. The type of testing recommended for you will depend on your family and medical history, your ethnicity, and your age. Testing may include:  Screening tests. These tests may include an ultrasound, blood tests, or a combination of both. The blood tests are used to check for abnormal genes, and the ultrasound is done to look for early birth defects.  Carrier screening. This test involves checking the blood or saliva of both parents to see if they carry abnormal genes that could be passed down to a baby. If genetic screening shows that your baby is at risk for a genetic defect, additional diagnostic testing may be recommended, such as:  Amniocentesis. This involves testing a sample of fluid from your womb (amniotic fluid).  Chorionic villus sampling. In this test, a sample of cells from your placenta is checked for abnormal cells. Unlike other tests done during pregnancy, diagnostic testing does have some risk for your pregnancy. Talk to your health care provider about the risks and benefits of genetic testing. Where to find more information  American Pregnancy Association: americanpregnancy.org/prenatal-testing  Office on Women's Health: KeywordPortfolios.com.br  March of Dimes: marchofdimes.org/pregnancy Questions to ask your health care provider  What routine tests are recommended for me?  When and how will these tests be done?  When will I get the results of routine tests?  What do the results of these tests mean for me or my baby?  Do you recommend any genetic screening tests? Which ones?  Should I see a genetic counselor before having genetic screening? Summary  Having tests and screenings during pregnancy is an  important part  of your prenatal care.  In early pregnancy, testing may be done to check blood type, Rh status, and risks for various conditions that can affect your baby.  Fetal ultrasound may be done in early pregnancy to confirm a pregnancy and later to look for any birth defects.  Later in pregnancy, tests may include screening for GBS and gestational diabetes.  Genetic testing is optional. Consider talking to a genetic counselor about this testing. This information is not intended to replace advice given to you by your health care provider. Make sure you discuss any questions you have with your health care provider. Document Released: 06/17/2017 Document Revised: 07/23/2018 Document Reviewed: 06/17/2017 Elsevier Patient Education  2020 [place].  Vaginal Bleeding During Pregnancy, First Trimester  A small amount of bleeding from the vagina (spotting) is relatively common during early pregnancy. It usually stops on its own. Various things may cause bleeding or spotting during early pregnancy. Some bleeding may be related to the pregnancy, and some may not. In many cases, the bleeding is normal and is not a problem. However, bleeding can also be a sign of something serious. Be sure to tell your health care provider about any vaginal bleeding right away. Some possible causes of vaginal bleeding during the first trimester include:  Infection or inflammation of the cervix.  Growths (polyps) on the cervix.  Miscarriage or threatened miscarriage.  Pregnancy tissue developing outside of the uterus (ectopic pregnancy).  A mass of tissue developing in the uterus due to an egg being fertilized incorrectly (molar pregnancy). Follow these instructions at home: Activity  Follow instructions from your health care provider about limiting your activity. Ask what activities are safe for you.  If needed, make plans for someone to help with your regular activities.  Do not have sex or orgasms  until your health care provider says that this is safe. General instructions  Take over-the-counter and prescription medicines only as told by your health care provider.  Pay attention to any changes in your symptoms.  Do not use tampons or douche.  Write down how many pads you use each day, how often you change pads, and how soaked (saturated) they are.  If you pass any tissue from your vagina, save the tissue so you can show it to your health care provider.  Keep all follow-up visits as told by your health care provider. This is important. Contact a health care provider if:  You have vaginal bleeding during any part of your pregnancy.  You have cramps or labor pains.  You have a fever. Get help right away if:  You have severe cramps in your back or abdomen.  You pass large clots or a large amount of tissue from your vagina.  Your bleeding increases.  You feel light-headed or weak, or you faint.  You have chills.  You are leaking fluid or have a gush of fluid from your vagina. Summary  A small amount of bleeding (spotting) from the vagina is relatively common during early pregnancy.  Various things may cause bleeding or spotting in early pregnancy.  Be sure to tell your health care provider about any vaginal bleeding right away. This information is not intended to replace advice given to you by your health care provider. Make sure you discuss any questions you have with your health care provider. Document Released: 01/10/2005 Document Revised: 07/22/2018 Document Reviewed: 07/05/2016 Elsevier Patient Education  2020 [place].  Warning Signs During Pregnancy A pregnancy lasts about 40 weeks, starting  from the first day of your last period until the baby is born. Pregnancy is divided into three phases called trimesters.  The first trimester refers to week 1 through week 13 of pregnancy.  The second trimester is the start of week 14 through the end of week  27.  The third trimester is the start of week 28 until you deliver your baby. During each trimester of pregnancy, certain signs and symptoms may indicate a problem. Talk with your health care provider about your current health and any medical conditions you have. Make sure you know the symptoms that you should watch for and report. How does this affect me?  Warning signs in the first trimester While some changes during the first trimester may be uncomfortable, most do not represent a serious problem. Let your health care provider know if you have any of the following warning signs in the first trimester:  You cannot eat or drink without vomiting, and this lasts for longer than a day.  You have vaginal bleeding or spotting along with menstrual-like cramping.  You have diarrhea for longer than a day.  You have a fever or other signs of infection, such as: ? Pain or burning when you urinate. ? Foul smelling or thick or yellowish vaginal discharge. Warning signs in the second trimester As your baby grows and changes during the second trimester, there are additional signs and symptoms that may indicate a problem. These include:  Signs and symptoms of infection, including a fever.  Signs or symptoms of a miscarriage or preterm labor, such as regular contractions, menstrual-like cramping, or lower abdominal pain.  Bloody or watery vaginal discharge or obvious vaginal bleeding.  Feeling like your heart is pounding.  Having trouble breathing.  Nausea, vomiting, or diarrhea that lasts for longer than a day.  Craving non-food items, such as clay, chalk, or dirt. This may be a sign of a very treatable medical condition called pica. Later in your second trimester, watch for signs and symptoms of a serious medical condition called preeclampsia.These include:  Changes in your vision.  A severe headache that does not go away.  Nausea and vomiting. It is also important to notice if your baby  stops moving or moves less than usual during this time. Warning signs in the third trimester As you approach the third trimester, your baby is growing and your body is preparing for the birth of your baby. In your third trimester, be sure to let your health care provider know if:  You have signs and symptoms of infection, including a fever.  You have vaginal bleeding.  You notice that your baby is moving less than usual or is not moving.  You have nausea, vomiting, or diarrhea that lasts for longer than a day.  You have a severe headache that does not go away.  You have vision changes, including seeing spots or having blurry or double vision.  You have increased swelling in your hands or face. How does this affect my baby? Throughout your pregnancy, always report any of the warning signs of a problem to your health care provider. This can help prevent complications that may affect your baby, including:  Increased risk for premature birth.  Infection that may be transmitted to your baby.  Increased risk for stillbirth. Contact a health care provider if:  You have any of the warning signs of a problem for the current trimester of your pregnancy.  Any of the following apply to you during any  trimester of pregnancy: ? You have strong emotions, such as sadness or anxiety, that interfere with work or personal relationships. ? You feel unsafe in your home and need help finding a safe place to live. ? You are using tobacco products, alcohol, or drugs and you need help to stop. Get help right away if: You have signs or symptoms of labor before 37 weeks of pregnancy. These include:  Contractions that are 5 minutes or less apart, or that increase in frequency, intensity, or length.  Sudden, sharp abdominal pain or low back pain.  Uncontrolled gush or trickle of fluid from your vagina. Summary  A pregnancy lasts about 40 weeks, starting from the first day of your last period until the  baby is born. Pregnancy is divided into three phases called trimesters. Each trimester has warning signs to watch for.  Always report any warning signs to your health care provider in order to prevent complications that may affect both you and your baby.  Talk with your health care provider about your current health and any medical conditions you have. Make sure you know the symptoms that you should watch for and report. This information is not intended to replace advice given to you by your health care provider. Make sure you discuss any questions you have with your health care provider. Document Released: 01/17/2017 Document Revised: 07/22/2018 Document Reviewed: 01/17/2017 Elsevier Patient Education  2020 [place].  Human Chorionic Gonadotropin Test Why am I having this test? A human chorionic gonadotropin (hCG) test is done to determine whether you are pregnant. It can also be used:  To diagnose an abnormal pregnancy.  To determine whether you have had a failed pregnancy (miscarriage) or are at risk of one. What is being tested? This test checks the level of the human chorionic gonadotropin (hCG) hormone in the blood. This hormone is produced during pregnancy by the cells that form the placenta. The placenta is the organ that grows inside your womb (uterus) to nourish a developing baby. When you are pregnant, hCG can be detected in your blood or urine 7 to 8 days before your missed period. It continues to go up for the first 8-10 weeks of pregnancy. The presence of hCG in your blood can be measured with several different types of tests. You may have:  A urine test. ? Because this hormone is eliminated from your body by your kidneys, you may have a urine test to find out whether you are pregnant. A home pregnancy test detects whether there is hCG in your urine. ? A urine test only shows whether there is hCG in your urine. It does not measure how much.  A qualitative blood test. ? You  may have this type of blood test to find out if you are pregnant. ? This blood test only shows whether there is hCG in your blood. It does not measure how much.  A quantitative blood test. ? This type of blood test measures the amount of hCG in your blood. ? You may have this test to:  Diagnose an abnormal pregnancy.  Check whether you have had a miscarriage.  Determine whether you are at risk of a miscarriage. What kind of sample is taken?     Two kinds of samples may be collected to test for the hCG hormone.  Blood. It is usually collected by inserting a needle into a blood vessel.  Urine. It is usually collected by urinating into a germ-free (sterile) specimen cup. It is  best to collect the sample the first time you urinate in the morning. How do I prepare for this test? No preparation is needed for a blood test.  For the urine test:  Let your health care provider know about: ? All medicines you are taking, including vitamins, herbs, creams, and over-the-counter medicines. ? Any blood in your urine. This may interfere with the result.  Do not drink too much fluid. Drink as you normally would, or as directed by your health care provider. How are the results reported? Depending on the type of test that you have, your test results may be reported as values. Your health care provider will compare your results to normal ranges that were established after testing a large group of people (reference ranges). Reference ranges may vary among labs and hospitals. For this test, common reference ranges that show absence of pregnancy are:  Quantitative hCG blood levels: less than 5 IU/L. Other results will be reported as either positive or negative. For this test, normal results (meaning the absence of pregnancy) are:  Negative for hCG in the urine test.  Negative for hCG in the qualitative blood test. What do the results mean? Urine and qualitative blood test  A negative result could  mean: ? That you are not pregnant. ? That the test was done too early in your pregnancy to detect hCG in your blood or urine. If you still have other signs of pregnancy, the test will be repeated.  A positive result means: ? That you are most likely pregnant. Your health care provider may confirm your pregnancy with an imaging study (ultrasound) of your uterus, if needed. Quantitative blood test Results of the quantitative hCG blood test will be interpreted as follows:  Less than 5 IU/L: You are most likely not pregnant.  Greater than 25 IU/L: You are most likely pregnant.  hCG levels that are higher than expected: ? You are pregnant with twins. ? You have abnormal growths in the uterus.  hCG levels that are rising more slowly than expected: ? You have an ectopic pregnancy (also called a tubal pregnancy).  hCG levels that are falling: ? You may be having a miscarriage. Talk with your health care provider about what your results mean. Questions to ask your health care provider Ask your health care provider, or the department that is doing the test:  When will my results be ready?  How will I get my results?  What are my treatment options?  What other tests do I need?  What are my next steps? Summary  A human chorionic gonadotropin test is done to determine whether you are pregnant.  When you are pregnant, hCG can be detected in your blood or urine 7 to 8 days before your missed period. It continues to go up for the first 8-10 weeks of pregnancy.  Your hCG level can be measured with different types of tests. You may have a urine test, a qualitative blood test, or a quantitative blood test.  Talk with your health care provider about what your results mean. This information is not intended to replace advice given to you by your health care provider. Make sure you discuss any questions you have with your health care provider. Document Released: 05/04/2004 Document Revised:  03/04/2017 Document Reviewed: 03/04/2017 Elsevier Patient Education  2020 [employer].  How a Baby Grows During Pregnancy  Pregnancy begins when a female's sperm enters a female's egg (fertilization). Fertilization usually happens in one of the tubes (  fallopian tubes) that connect the ovaries to the womb (uterus). The fertilized egg moves down the fallopian tube to the uterus. Once it reaches the uterus, it implants into the lining of the uterus and begins to grow. For the first 10 weeks, the fertilized egg is called an embryo. After 10 weeks, it is called a fetus. As the fetus continues to grow, it receives oxygen and nutrients through tissue (placenta) that grows to support the developing baby. The placenta is the life support system for the baby. It provides oxygen and nutrition and removes waste. Learning as much as you can about your pregnancy and how your baby is developing can help you enjoy the experience. It can also make you aware of when there might be a problem and when to ask questions. How long does a typical pregnancy last? A pregnancy usually lasts 280 days, or about 40 weeks. Pregnancy is divided into three periods of growth, also called trimesters:  First trimester: 0-12 weeks.  Second trimester: 13-27 weeks.  Third trimester: 28-40 weeks. The day when your baby is ready to be born (full term) is your estimated date of delivery. How does my baby develop month by month? First month  The fertilized egg attaches to the inside of the uterus.  Some cells will form the placenta. Others will form the fetus.  The arms, legs, brain, spinal cord, lungs, and heart begin to develop.  At the end of the first month, the heart begins to beat. Second month  The bones, inner ear, eyelids, hands, and feet form.  The genitals develop.  By the end of 8 weeks, all major organs are developing. Third month  All of the internal organs are forming.  Teeth develop below the  gums.  Bones and muscles begin to grow. The spine can flex.  The skin is transparent.  Fingernails and toenails begin to form.  Arms and legs continue to grow longer, and hands and feet develop.  The fetus is about 3 inches (7.6 cm) long. Fourth month  The placenta is completely formed.  The external sex organs, neck, outer ear, eyebrows, eyelids, and fingernails are formed.  The fetus can hear, swallow, and move its arms and legs.  The kidneys begin to produce urine.  The skin is covered with a white, waxy coating (vernix) and very fine hair (lanugo). Fifth month  The fetus moves around more and can be felt for the first time (quickening).  The fetus starts to sleep and wake up and may begin to suck its finger.  The nails grow to the end of the fingers.  The organ in the digestive system that makes bile (gallbladder) functions and helps to digest nutrients.  If your baby is a girl, eggs are present in her ovaries. If your baby is a boy, testicles start to move down into his scrotum. Sixth month  The lungs are formed.  The eyes open. The brain continues to develop.  Your baby has fingerprints and toe prints. Your baby's hair grows thicker.  At the end of the second trimester, the fetus is about 9 inches (22.9 cm) long. Seventh month  The fetus kicks and stretches.  The eyes are developed enough to sense changes in light.  The hands can make a grasping motion.  The fetus responds to sound. Eighth month  All organs and body systems are fully developed and functioning.  Bones harden, and taste buds develop. The fetus may hiccup.  Certain areas of the brain  are still developing. The skull remains soft. Ninth month  The fetus gains about  lb (0.23 kg) each week.  The lungs are fully developed.  Patterns of sleep develop.  The fetus's head typically moves into a head-down position (vertex) in the uterus to prepare for birth.  The fetus weighs 6-9 lb  (2.72-4.08 kg) and is 19-20 inches (48.26-50.8 cm) long. What can I do to have a healthy pregnancy and help my baby develop? General instructions  Take prenatal vitamins as directed by your health care provider. These include vitamins such as folic acid, iron, calcium, and vitamin D. They are important for healthy development.  Take medicines only as directed by your health care provider. Read labels and ask a pharmacist or your health care provider whether over-the-counter medicines, supplements, and prescription drugs are safe to take during pregnancy.  Keep all follow-up visits as directed by your health care provider. This is important. Follow-up visits include prenatal care and screening tests. How do I know if my baby is developing well? At each prenatal visit, your health care provider will do several different tests to check on your health and keep track of your baby's development. These include:  Fundal height and position. ? Your health care provider will measure your growing belly from your pubic bone to the top of the uterus using a tape measure. ? Your health care provider will also feel your belly to determine your baby's position.  Heartbeat. ? An ultrasound in the first trimester can confirm pregnancy and show a heartbeat, depending on how far along you are. ? Your health care provider will check your baby's heart rate at every prenatal visit.  Second trimester ultrasound. ? This ultrasound checks your baby's development. It also may show your baby's gender. What should I do if I have concerns about my baby's development? Always talk with your health care provider about any concerns that you may have about your pregnancy and your baby. Summary  A pregnancy usually lasts 280 days, or about 40 weeks. Pregnancy is divided into three periods of growth, also called trimesters.  Your health care provider will monitor your baby's growth and development throughout your  pregnancy.  Follow your health care provider's recommendations about taking prenatal vitamins and medicines during your pregnancy.  Talk with your health care provider if you have any concerns about your pregnancy or your developing baby. This information is not intended to replace advice given to you by your health care provider. Make sure you discuss any questions you have with your health care provider. Document Released: 09/19/2007 Document Revised: 07/24/2018 Document Reviewed: 02/13/2017 Elsevier Patient Education  2020 [place] of Pregnancy The first trimester of pregnancy is from week 1 until the end of week 13 (months 1 through 3). A week after a sperm fertilizes an egg, the egg will implant on the wall of the uterus. This embryo will begin to develop into a baby. Genes from you and your partner will form the baby. The female genes will determine whether the baby will be a boy or a girl. At 6-8 weeks, the eyes and face will be formed, and the heartbeat can be seen on ultrasound. At the end of 12 weeks, all the baby's organs will be formed. Now that you are pregnant, you will want to do everything you can to have a healthy baby. Two of the most important things are to get good prenatal care and to follow your health care provider's  instructions. Prenatal care is all the medical care you receive before the baby's birth. This care will help prevent, find, and treat any problems during the pregnancy and childbirth. Body changes during your first trimester Your body goes through many changes during pregnancy. The changes vary from woman to woman.  You may gain or lose a couple of pounds at first.  You may feel sick to your stomach (nauseous) and you may throw up (vomit). If the vomiting is uncontrollable, call your health care provider.  You may tire easily.  You may develop headaches that can be relieved by medicines. All medicines should be approved by your health care  provider.  You may urinate more often. Painful urination may mean you have a bladder infection.  You may develop heartburn as a result of your pregnancy.  You may develop constipation because certain hormones are causing the muscles that push stool through your intestines to slow down.  You may develop hemorrhoids or swollen veins (varicose veins).  Your breasts may begin to grow larger and become tender. Your nipples may stick out more, and the tissue that surrounds them (areola) may become darker.  Your gums may bleed and may be sensitive to brushing and flossing.  Dark spots or blotches (chloasma, mask of pregnancy) may develop on your face. This will likely fade after the baby is born.  Your menstrual periods will stop.  You may have a loss of appetite.  You may develop cravings for certain kinds of food.  You may have changes in your emotions from day to day, such as being excited to be pregnant or being concerned that something may go wrong with the pregnancy and baby.  You may have more vivid and strange dreams.  You may have changes in your hair. These can include thickening of your hair, rapid growth, and changes in texture. Some women also have hair loss during or after pregnancy, or hair that feels dry or thin. Your hair will most likely return to normal after your baby is born. What to expect at prenatal visits During a routine prenatal visit:  You will be weighed to make sure you and the baby are growing normally.  Your blood pressure will be taken.  Your abdomen will be measured to track your baby's growth.  The fetal heartbeat will be listened to between weeks 10 and 14 of your pregnancy.  Test results from any previous visits will be discussed. Your health care provider may ask you:  How you are feeling.  If you are feeling the baby move.  If you have had any abnormal symptoms, such as leaking fluid, bleeding, severe headaches, or abdominal cramping.  If  you are using any tobacco products, including cigarettes, chewing tobacco, and electronic cigarettes.  If you have any questions. Other tests that may be performed during your first trimester include:  Blood tests to find your blood type and to check for the presence of any previous infections. The tests will also be used to check for low iron levels (anemia) and protein on red blood cells (Rh antibodies). Depending on your risk factors, or if you previously had diabetes during pregnancy, you may have tests to check for high blood sugar that affects pregnant women (gestational diabetes).  Urine tests to check for infections, diabetes, or protein in the urine.  An ultrasound to confirm the proper growth and development of the baby.  Fetal screens for spinal cord problems (spina bifida) and Down syndrome.  HIV (human  immunodeficiency virus) testing. Routine prenatal testing includes screening for HIV, unless you choose not to have this test.  You may need other tests to make sure you and the baby are doing well. Follow these instructions at home: Medicines  Follow your health care provider's instructions regarding medicine use. Specific medicines may be either safe or unsafe to take during pregnancy.  Take a prenatal vitamin that contains at least 600 micrograms (mcg) of folic acid.  If you develop constipation, try taking a stool softener if your health care provider approves. Eating and drinking   Eat a balanced diet that includes fresh fruits and vegetables, whole grains, good sources of protein such as meat, eggs, or tofu, and low-fat dairy. Your health care provider will help you determine the amount of weight gain that is right for you.  Avoid raw meat and uncooked cheese. These carry germs that can cause birth defects in the baby.  Eating four or five small meals rather than three large meals a day may help relieve nausea and vomiting. If you start to feel nauseous, eating a few  soda crackers can be helpful. Drinking liquids between meals, instead of during meals, also seems to help ease nausea and vomiting.  Limit foods that are high in fat and processed sugars, such as fried and sweet foods.  To prevent constipation: ? Eat foods that are high in fiber, such as fresh fruits and vegetables, whole grains, and beans. ? Drink enough fluid to keep your urine clear or pale yellow. Activity  Exercise only as directed by your health care provider. Most women can continue their usual exercise routine during pregnancy. Try to exercise for 30 minutes at least 5 days a week. Exercising will help you: ? Control your weight. ? Stay in shape. ? Be prepared for labor and delivery.  Experiencing pain or cramping in the lower abdomen or lower back is a good sign that you should stop exercising. Check with your health care provider before continuing with normal exercises.  Try to avoid standing for long periods of time. Move your legs often if you must stand in one place for a long time.  Avoid heavy lifting.  Wear low-heeled shoes and practice good posture.  You may continue to have sex unless your health care provider tells you not to. Relieving pain and discomfort  Wear a good support bra to relieve breast tenderness.  Take warm sitz baths to soothe any pain or discomfort caused by hemorrhoids. Use hemorrhoid cream if your health care provider approves.  Rest with your legs elevated if you have leg cramps or low back pain.  If you develop varicose veins in your legs, wear support hose. Elevate your feet for 15 minutes, 3-4 times a day. Limit salt in your diet. Prenatal care  Schedule your prenatal visits by the twelfth week of pregnancy. They are usually scheduled monthly at first, then more often in the last 2 months before delivery.  Write down your questions. Take them to your prenatal visits.  Keep all your prenatal visits as told by your health care provider.  This is important. Safety  Wear your seat belt at all times when driving.  Make a list of emergency phone numbers, including numbers for family, friends, the hospital, and police and fire departments. General instructions  Ask your health care provider for a referral to a local prenatal education class. Begin classes no later than the beginning of month 6 of your pregnancy.  Ask for help  if you have counseling or nutritional needs during pregnancy. Your health care provider can offer advice or refer you to specialists for help with various needs.  Do not use hot tubs, steam rooms, or saunas.  Do not douche or use tampons or scented sanitary pads.  Do not cross your legs for long periods of time.  Avoid cat litter boxes and soil used by cats. These carry germs that can cause birth defects in the baby and possibly loss of the fetus by miscarriage or stillbirth.  Avoid all smoking, herbs, alcohol, and medicines not prescribed by your health care provider. Chemicals in these products affect the formation and growth of the baby.  Do not use any products that contain nicotine or tobacco, such as cigarettes and e-cigarettes. If you need help quitting, ask your health care provider. You may receive counseling support and other resources to help you quit.  Schedule a dentist appointment. At home, brush your teeth with a soft toothbrush and be gentle when you floss. Contact a health care provider if:  You have dizziness.  You have mild pelvic cramps, pelvic pressure, or nagging pain in the abdominal area.  You have persistent nausea, vomiting, or diarrhea.  You have a bad smelling vaginal discharge.  You have pain when you urinate.  You notice increased swelling in your face, hands, legs, or ankles.  You are exposed to fifth disease or chickenpox.  You are exposed to [place] measles (rubella) and have never had it. Get help right away if:  You have a fever.  You are leaking fluid  from your vagina.  You have spotting or bleeding from your vagina.  You have severe abdominal cramping or pain.  You have rapid weight gain or loss.  You vomit blood or material that looks like coffee grounds.  You develop a severe headache.  You have shortness of breath.  You have any kind of trauma, such as from a fall or a car accident. Summary  The first trimester of pregnancy is from week 1 until the end of week 13 (months 1 through 3).  Your body goes through many changes during pregnancy. The changes vary from woman to woman.  You will have routine prenatal visits. During those visits, your health care provider will examine you, discuss any test results you may have, and talk with you about how you are feeling. This information is not intended to replace advice given to you by your health care provider. Make sure you discuss any questions you have with your health care provider. Document Released: 03/27/2001 Document Revised: 03/15/2017 Document Reviewed: 03/14/2016 Elsevier Patient Education  [place].  Fetal Positions  In the final weeks of your pregnancy, your baby usually moves into a head-down (vertex) position to get ready for birth. As a normal delivery proceeds through the stages of labor, the baby tucks in the chin and turns to face your back. In this position, the back of your baby's head starts to show (crown) first through your open cervix. Sometimes your baby may be in a different, abnormal position just before birth. These positions are called malpositions or malpresentations. Giving birth can be more difficult if your baby is in an abnormal position. What are abnormal fetal positions? There are five main abnormal fetal positions:  Occiput posterior presentation. This is the most common abnormal fetal position. It is sometimes called the "sunny-side up" position because your baby's face points toward your front instead of your back.  Breech  presentation. This is also common. In this position, your baby's bottom or feet are in position to come out first.  Face or brow presentation. In this position, your baby is head down, but the face or the front of the head crowns first.  Compound presentation. In this position, your baby's hand or leg comes out along with the head or bottom.  Transverse presentation. In this position, your baby is lying sideways across your birth canal. Your baby's shoulder may come out first. Your health care provider can diagnose an abnormal fetal position during a physical exam as your due date approaches. An abnormal fetal position may be found by feeling your belly and by doing an internal (pelvic) exam. A sound wave imaging study (fetal ultrasound) can be done to confirm the abnormal position. What causes an abnormal fetal position? In many cases, the cause for an abnormal fetal position is not known. You may be at higher risk of having a baby in an abnormal fetal position if:  You have an abnormally shaped womb (uterus) or pelvis.  You have growths in your uterus, such as fibroids.  Your placenta is large or in an abnormal position.  You are having twins or multiples.  You have too much amniotic fluid.  Your baby has some type of developmental abnormality.  You go into early (premature) labor. How does this affect me? In some cases, your baby may move into a vertex position just before or during labor. However, an abnormal fetal position increases your risk for a long labor or the need for steps to be taken to help ensure a safe delivery. Your health care provider may need to:  Turn the baby manually by pushing on your belly (external cephalic version). ? Use instruments, such as forceps or a suctioning device, to help get your baby through the birth canal (assisted delivery). ? Deliver your baby by cesarean delivery, also called a C-section. The exact effects on your delivery will depend on the  position your baby is in right before birth. If your baby has an occiput posterior presentation:  You may have to push harder and may have a longer labor.  You may have more back pain.  You may deliver vaginally, but you are more likely to need an assisted delivery.  You may need a cesarean delivery. If your baby is breech:  Your health care provider may try a vaginal delivery, but there is a risk that your baby's umbilical cord will be stretched or compressed and your baby will not get enough oxygen.  In most cases, you will need a cesarean delivery. If your baby is in a face or brow presentation:  Your labor may be longer.  You may be able to have a vaginal or assisted vaginal delivery.  There is a higher-than-normal risk that you will need a cesarean delivery. If your baby is in a compound presentation:  Your health care provider may be able to change your baby's position manually.  In most cases, this position requires a cesarean delivery. If your baby is in a transverse presentation:  Your health care provider may be able to turn your baby manually.  In most cases, this position requires a cesarean delivery. How does this affect my baby? Most babies are not affected by an abnormal fetal position, but there is a higher risk of some complications, including:  Swelling and bruising.  Birth injuries.  Not getting enough oxygen during birth. Summary  The normal fetal position for  birth is head down and facing toward your back (vertex position).  Abnormal fetal positions include occiput posterior, breech, face or brow presentation, compound presentation, and transverse position.  In some cases, your baby may move into a normal position before birth, or your health care provider may be able to change your baby's position manually.  If your baby is in an abnormal fetal position at the time of birth, you have a greater risk of a longer labor, assisted delivery, and  cesarean delivery. This information is not intended to replace advice given to you by your health care provider. Make sure you discuss any questions you have with your health care provider. Document Released: 01/16/2017 Document Revised: 07/24/2018 Document Reviewed: 01/16/2017 Elsevier Patient Education  [place].  Diabetes Mellitus and Nutrition, Adult When you have diabetes (diabetes mellitus), it is very important to have healthy eating habits because your blood sugar (glucose) levels are greatly affected by what you eat and drink. Eating healthy foods in the appropriate amounts, at about the same times every day, can help you:  Control your blood glucose.  Lower your risk of heart disease.  Improve your blood pressure.  Reach or maintain a healthy weight. Every person with diabetes is different, and each person has different needs for a meal plan. Your health care provider may recommend that you work with a diet and nutrition specialist (dietitian) to make a meal plan that is best for you. Your meal plan may vary depending on factors such as:  The calories you need.  The medicines you take.  Your weight.  Your blood glucose, blood pressure, and cholesterol levels.  Your activity level.  Other health conditions you have, such as heart or kidney disease. How do carbohydrates affect me? Carbohydrates, also called carbs, affect your blood glucose level more than any other type of food. Eating carbs naturally raises the amount of glucose in your blood. Carb counting is a method for keeping track of how many carbs you eat. Counting carbs is important to keep your blood glucose at a healthy level, especially if you use insulin or take certain oral diabetes medicines. It is important to know how many carbs you can safely have in each meal. This is different for every person. Your dietitian can help you calculate how many carbs you should have at each meal and for each  snack. Foods that contain carbs include:  Bread, cereal, rice, pasta, and crackers.  Potatoes and corn.  Peas, beans, and lentils.  Milk and yogurt.  Fruit and juice.  Desserts, such as cakes, cookies, ice cream, and candy. How does alcohol affect me? Alcohol can cause a sudden decrease in blood glucose (hypoglycemia), especially if you use insulin or take certain oral diabetes medicines. Hypoglycemia can be a life-threatening condition. Symptoms of hypoglycemia (sleepiness, dizziness, and confusion) are similar to symptoms of having too much alcohol. If your health care provider says that alcohol is safe for you, follow these guidelines:  Limit alcohol intake to no more than 1 drink per day for nonpregnant women and 2 drinks per day for men. One drink equals 12 oz of beer, 5 oz of wine, or 1 oz of hard liquor.  Do not drink on an empty stomach.  Keep yourself hydrated with water, diet soda, or unsweetened iced tea.  Keep in mind that regular soda, juice, and other mixers may contain a lot of sugar and must be counted as carbs. What are tips for following this plan?  Reading food labels  Start by checking the serving size on the "Nutrition Facts" label of packaged foods and drinks. The amount of calories, carbs, fats, and other nutrients listed on the label is based on one serving of the item. Many items contain more than one serving per package.  Check the total grams (g) of carbs in one serving. You can calculate the number of servings of carbs in one serving by dividing the total carbs by 15. For example, if a food has 30 g of total carbs, it would be equal to 2 servings of carbs.  Check the number of grams (g) of saturated and trans fats in one serving. Choose foods that have low or no amount of these fats.  Check the number of milligrams (mg) of salt (sodium) in one serving. Most people should limit total sodium intake to less than 2,300 mg per day.  Always check the  nutrition information of foods labeled as "low-fat" or "nonfat". These foods may be higher in added sugar or refined carbs and should be avoided.  Talk to your dietitian to identify your daily goals for nutrients listed on the label. Shopping  Avoid buying canned, premade, or processed foods. These foods tend to be high in fat, sodium, and added sugar.  Shop around the outside edge of the grocery store. This includes fresh fruits and vegetables, bulk grains, fresh meats, and fresh dairy. Cooking  Use low-heat cooking methods, such as baking, instead of high-heat cooking methods like deep frying.  Cook using healthy oils, such as olive, canola, or sunflower oil.  Avoid cooking with butter, cream, or high-fat meats. Meal planning  Eat meals and snacks regularly, preferably at the same times every day. Avoid going long periods of time without eating.  Eat foods high in fiber, such as fresh fruits, vegetables, beans, and whole grains. Talk to your dietitian about how many servings of carbs you can eat at each meal.  Eat 4-6 ounces (oz) of lean protein each day, such as lean meat, chicken, fish, eggs, or tofu. One oz of lean protein is equal to: ? 1 oz of meat, chicken, or fish. ? 1 egg. ?  cup of tofu.  Eat some foods each day that contain healthy fats, such as avocado, nuts, seeds, and fish. Lifestyle  Check your blood glucose regularly.  Exercise regularly as told by your health care provider. This may include: ? 150 minutes of moderate-intensity or vigorous-intensity exercise each week. This could be brisk walking, biking, or water aerobics. ? Stretching and doing strength exercises, such as yoga or weightlifting, at least 2 times a week.  Take medicines as told by your health care provider.  Do not use any products that contain nicotine or tobacco, such as cigarettes and e-cigarettes. If you need help quitting, ask your health care provider.  Work with a Social worker or diabetes  educator to identify strategies to manage stress and any emotional and social challenges. Questions to ask a health care provider  Do I need to meet with a diabetes educator?  Do I need to meet with a dietitian?  What number can I call if I have questions?  When are the best times to check my blood glucose? Where to find more information:  American Diabetes Association: diabetes.org  Academy of Nutrition and Dietetics: www.eatright.[employer] of Diabetes and Digestive and Kidney Diseases (NIH): [employer] Summary  A healthy meal plan will help you control your blood glucose and maintain a healthy  lifestyle.  Working with a diet and nutrition specialist (dietitian) can help you make a meal plan that is best for you.  Keep in mind that carbohydrates (carbs) and alcohol have immediate effects on your blood glucose levels. It is important to count carbs and to use alcohol carefully. This information is not intended to replace advice given to you by your health care provider. Make sure you discuss any questions you have with your health care provider. Document Released: 12/28/2004 Document Revised: 03/15/2017 Document Reviewed: 05/07/2016 Elsevier Patient Education  2020 [place].  Ectopic Pregnancy  An ectopic pregnancy is when the fertilized egg attaches (implants) outside the uterus. Most ectopic pregnancies occur in one of the tubes where eggs travel from the ovary to the uterus (fallopian tubes), but the implanting can occur in other locations. In rare cases, ectopic pregnancies occur on the ovary, intestine, pelvis, abdomen, or cervix. In an ectopic pregnancy, the fertilized egg does not have the ability to develop into a normal, healthy baby. A ruptured ectopic pregnancy is one in which tearing or bursting of a fallopian tube causes internal bleeding. Often, there is intense lower abdominal pain, and vaginal bleeding sometimes occurs. Having an ectopic  pregnancy can be life-threatening. If this dangerous condition is not treated, it can lead to blood loss, shock, or even death. What are the causes? The most common cause of this condition is damage to one of the fallopian tubes. A fallopian tube may be narrowed or blocked, and that keeps the fertilized egg from reaching the uterus. What increases the risk? This condition is more likely to develop in women of childbearing age who have different levels of risk. The levels of risk can be divided into three categories. High risk  You have gone through infertility treatment.  You have had an ectopic pregnancy before.  You have had surgery on the fallopian tubes, or another surgical procedure, such as an abortion.  You have had surgery to have the fallopian tubes tied (tubal ligation).  You have problems or diseases of the fallopian tubes.  You have been exposed to diethylstilbestrol (DES). This medicine was used until 1971, and it had effects on babies whose mothers took the medicine.  You become pregnant while using an IUD (intrauterine device) for birth control. Moderate risk  You have a history of infertility.  You have had an STI (sexually transmitted infection).  You have a history of pelvic inflammatory disease (PID).  You have scarring from endometriosis.  You have multiple sexual partners.  You smoke. Low risk  You have had pelvic surgery.  You use vaginal douches.  You became sexually active before age 14. What are the signs or symptoms? Common symptoms of this condition include normal pregnancy symptoms, such as missing a period, nausea, tiredness, abdominal pain, breast tenderness, and bleeding. However, ectopic pregnancy will have additional symptoms, such as:  Pain with intercourse.  Irregular vaginal bleeding or spotting.  Cramping or pain on one side or in the lower abdomen.  Fast heartbeat, low blood pressure, and sweating.  Passing out while having a  bowel movement. Symptoms of a ruptured ectopic pregnancy and internal bleeding may include:  Sudden, severe pain in the abdomen and pelvis.  Dizziness, weakness, light-headedness, or fainting.  Pain in the shoulder or neck area. How is this diagnosed? This condition is diagnosed by:  A pelvic exam to locate pain or a mass in the abdomen.  A pregnancy test. This blood test checks for the presence as  well as the specific level of pregnancy hormone in the bloodstream.  Ultrasound. This is performed if a pregnancy test is positive. In this test, a probe is inserted into the vagina. The probe will detect a fetus, possibly in a location other than the uterus.  Taking a sample of uterus tissue (dilation and curettage, or D&C).  Surgery to perform a visual exam of the inside of the abdomen using a thin, lighted tube that has a tiny camera on the end (laparoscope).  Culdocentesis. This procedure involves inserting a needle at the top of the vagina, behind the uterus. If blood is present in this area, it may indicate that a fallopian tube is torn. How is this treated? This condition is treated with medicine or surgery. Medicine  An injection of a medicine (methotrexate) may be given to cause the pregnancy tissue to be absorbed. This medicine may save your fallopian tube. It may be given if: ? The diagnosis is made early, with no signs of active bleeding. ? The fallopian tube has not ruptured. ? You are considered to be a good candidate for the medicine. Usually, pregnancy hormone blood levels are checked after methotrexate treatment. This is to be sure that the medicine is effective. It may take 4-6 weeks for the pregnancy to be absorbed. Most pregnancies will be absorbed by 3 weeks. Surgery  A laparoscope may be used to remove the pregnancy tissue.  If severe internal bleeding occurs, a larger cut (incision) may be made in the lower abdomen (laparotomy) to remove the fetus and placenta.  This is done to stop the bleeding.  Part or all of the fallopian tube may be removed (salpingectomy) along with the fetus and placenta. The fallopian tube may also be repaired during the surgery.  In very rare circumstances, removal of the uterus (hysterectomy) may be required.  After surgery, pregnancy hormone testing may be done to be sure that there is no pregnancy tissue left. Whether your treatment is medicine or surgery, you may receive a Rho (D) immune globulin shot to prevent problems with any future pregnancy. This shot may be given if:  You are Rh-negative and the baby's father is Rh-positive.  You are Rh-negative and you do not know the Rh type of the baby's father. Follow these instructions at home:  Rest and limit your activity after the procedure for as long as told by your health care provider.  Until your health care provider says that it is safe: ? Do not lift anything that is heavier than 10 lb (4.5 kg), or the limit that your health care provider tells you. ? Avoid physical exercise and any movement that requires effort (is strenuous).  To help prevent constipation: ? Eat a healthy diet that includes fruits, vegetables, and whole grains. ? Drink 6-8 glasses of water per day. Get help right away if:  You develop worsening pain that is not relieved by medicine.  You have: ? A fever or chills. ? Vaginal bleeding. ? Redness and swelling at the incision site. ? Nausea and vomiting.  You feel dizzy or weak.  You feel light-headed or you faint. This information is not intended to replace advice given to you by your health care provider. Make sure you discuss any questions you have with your health care provider. Document Released: 05/10/2004 Document Revised: 03/15/2017 Document Reviewed: 11/02/2015 Elsevier Patient Education  [place].      Cesarean Delivery Cesarean birth, or cesarean delivery, is the surgical delivery of a  baby through an incision  in the abdomen and the uterus. This may be referred to as a C-section. This procedure may be scheduled ahead of time, or it may be done in an emergency situation. Tell a health care provider about:  Any allergies you have.  All medicines you are taking, including vitamins, herbs, eye drops, creams, and over-the-counter medicines.  Any problems you or family members have had with anesthetic medicines.  Any blood disorders you have.  Any surgeries you have had.  Any medical conditions you have.  Whether you or any members of your family have a history of deep vein thrombosis (DVT) or pulmonary embolism (PE). What are the risks? Generally, this is a safe procedure. However, problems may occur, including:  Infection.  Bleeding.  Allergic reactions to medicines.  Damage to other structures or organs.  Blood clots.  Injury to your baby. What happens before the procedure? General instructions  Follow instructions from your health care provider about eating or drinking restrictions.  If you know that you are going to have a cesarean delivery, do not shave your pubic area. Shaving before the procedure may increase your risk of infection.  Plan to have someone take you home from the hospital.  Ask your health care provider what steps will be taken to prevent infection. These may include: ? Removing hair at the surgery site. ? Washing skin with a germ-killing soap. ? Taking antibiotic medicine.  Depending on the reason for your cesarean delivery, you may have a physical exam or additional testing, such as an ultrasound.  You may have your blood or urine tested. Questions for your health care provider  Ask your health care provider about: ? Changing or stopping your regular medicines. This is especially important if you are taking diabetes medicines or blood thinners. ? Your pain management plan. This is especially important if you plan to breastfeed your baby. ? How long you  will be in the hospital after the procedure. ? Any concerns you may have about receiving blood products, if you need them during the procedure. ? Cord blood banking, if you plan to collect your baby's umbilical cord blood.  You may also want to ask your health care provider: ? Whether you will be able to hold or breastfeed your baby while you are still in the operating room. ? Whether your baby can stay with you immediately after the procedure and during your recovery. ? Whether a family member or a person of your choice can go with you into the operating room and stay with you during the procedure, immediately after the procedure, and during your recovery. What happens during the procedure?   An IV will be inserted into one of your veins.  Fluid and medicines, such as antibiotics, will be given before the surgery.  Fetal monitors will be placed on your abdomen to check your baby's heart rate.  You may be given a special warming gown to wear to keep your temperature stable.  A catheter may be inserted into your bladder through your urethra. This drains your urine during the procedure.  You may be given one or more of the following: ? A medicine to numb the area (local anesthetic). ? A medicine to make you fall asleep (general anesthetic). ? A medicine (regional anesthetic) that is injected into your back or through a small thin tube placed in your back (spinal anesthetic or epidural anesthetic). This numbs everything below the injection site and allows you to stay  awake during your procedure. If this makes you feel nauseous, tell your health care provider. Medicines will be available to help reduce any nausea you may feel.  An incision will be made in your abdomen, and then in your uterus.  If you are awake during your procedure, you may feel tugging and pulling in your abdomen, but you should not feel pain. If you feel pain, tell your health care provider immediately.  Your baby will  be removed from your uterus. You may feel more pressure or pushing while this happens.  Immediately after birth, your baby will be dried and kept warm. You may be able to hold and breastfeed your baby.  The umbilical cord may be clamped and cut during this time. This usually occurs after waiting a period of 1-2 minutes after delivery.  Your placenta will be removed from your uterus.  Your incisions will be closed with stitches (sutures). Staples, skin glue, or adhesive strips may also be applied to the incision in your abdomen.  Bandages (dressings) may be placed over the incision in your abdomen. The procedure may vary among health care providers and hospitals. What happens after the procedure?  Your blood pressure, heart rate, breathing rate, and blood oxygen level will be monitored until you are discharged from the hospital.  You may continue to receive fluids and medicines through an IV.  You will have some pain. Medicines will be available to help control your pain.  To help prevent blood clots: ? You may be given medicines. ? You may have to wear compression stockings or devices. ? You will be encouraged to walk around when you are able.  Hospital staff will encourage and support bonding with your baby. Your hospital may have you and your baby to stay in the same room (rooming in) during your hospital stay to encourage successful bonding and breastfeeding.  You may be encouraged to cough and breathe deeply often. This helps to prevent lung problems.  If you have a catheter draining your urine, it will be removed as soon as possible after your procedure. Summary  Cesarean birth, or cesarean delivery, is the surgical delivery of a baby through an incision in the abdomen and the uterus.  Follow instructions from your health care provider about eating or drinking restrictions before the procedure.  You will have some pain after the procedure. Medicines will be available to help  control your pain.  Hospital staff will encourage and support bonding with your baby after the procedure. Your hospital may have you and your baby to stay in the same room (rooming in) during your hospital stay to encourage successful bonding and breastfeeding. This information is not intended to replace advice given to you by your health care provider. Make sure you discuss any questions you have with your health care provider. Document Released: 04/02/2005 Document Revised: 10/07/2017 Document Reviewed: 10/07/2017 Elsevier Patient Education  2020 [place].  Cervical Insufficiency Cervical insufficiency is when the cervix is weak and starts to open (dilate) and thin (efface) before the pregnancy is at term and before labor starts. This is also called incompetent cervix. It can happen during the second or third trimester when the fetus starts putting pressure on the cervix. Treatment may reduce the risk of problems for you and your baby. Cervical insufficiency can lead to:  Loss of the baby (miscarriage).  Breaking of the sac that holds the baby (amniotic sac). This is also called preterm premature rupture of the membranes,PPROM.  The  baby being born early (preterm birth). What are the causes? The cause of this condition is not well known. However, it may be caused by abnormalities in the cervix and other factors such as inflammation or infection. What increases the risk? This condition is more likely to develop if:  You have a shorter cervix than normal.  Your cervix was damaged or injured during a past pregnancy or surgery.  You were born with a cervical defect.  You have had a procedure done on the cervix, such as cervical biopsy.  You have a history of: ? Cervical insufficiency. ? PPROM.  You have ended several pregnancies through abortion.  You were exposed to the drug diethylstilbestrol (DES). What are the signs or symptoms? Symptoms of this condition can vary.  Sometimes there no symptoms for this condition, and at other times there are mild symptoms that start between weeks 14 and 20 of pregnancy. The symptoms may last several days or weeks. These symptoms include:  Light spotting or bleeding from the vagina.  Pelvic pressure.  A change in vaginal discharge, such as changes from clear, white, or light yellow to pink or tan.  Back pain.  Abdominal pain or cramping. How is this diagnosed? This condition may be diagnosed based on:  Your symptoms.  Your medical history, including: ? Any problems during past pregnancies, such as miscarriages. ? Any procedures performed on your cervix. ? Any history of cervical insufficiency. During the second trimester, cervical insufficiency may be diagnosed based on:  An ultrasound done with a probe inserted into your vagina (transvaginal ultrasound).  A pelvic exam.  Tests of fluid in the amniotic sac. This is done to rule out infection. How is this treated? This condition may be managed by:  Limiting physical activity.  Limiting activity at home or in the hospital.  Pelvic rest. This means that there should be no sexual intercourse or placing anything in the vagina.  A procedure to sew the cervix closed and prevent it from opening too early (cerclage). The stitches (sutures) are removed between weeks 36 and 38 to avoid problems during labor. Cerclage may be recommended if: ? You have a history of miscarriages or preterm births without a known cause. ? You have a short cervix. A short cervix is identified by ultrasound. ? Your cervix has dilated before 24 weeks of pregnancy. Follow these instructions at home:  Get plenty of rest and lessen activity as told by your health care provider. Ask your health care provider what activities are safe for you.  If pelvic rest was recommended, you shouldnot have sex, use tampons, douche, or place anything inside your vagina until your health care provider says  that this is okay.  Take over-the-counter and prescription medicines only as told by your health care provider.  Keep all follow-up visits and prenatal visits as told by your health care provider. This is important. Get help right away if:  You have vaginal bleeding, even if it is a small amount or even if it is painless.  You have pain in your abdomen or your lower back.  You have a feeling of increased pressure in your pelvis.  You have vaginal discharge that changes from clear, white, or light yellow to pink or tan.  You have a fever.  You have severe nausea or vomiting. Summary  Cervical insufficiency is when the cervix is weak and starts to dilate and efface before the pregnancy is at term and before labor starts.  Symptoms of  this condition can vary from no symptoms to mild symptoms that start between weeks 14 and 20 of pregnancy. The symptoms may last several days or weeks.  This condition may be managed by limiting physical activity, having pelvic rest, or having cervical cerclage.  If pelvic rest was recommended, you should not have sex, use tampons, use a douche, or place anything inside your vagina until your health care provider says that this is okay. This information is not intended to replace advice given to you by your health care provider. Make sure you discuss any questions you have with your health care provider. Document Released: 04/02/2005 Document Revised: 03/15/2017 Document Reviewed: 04/05/2016 Elsevier Patient Education  2020 [employer]  [employer] of the uterus can occur throughout pregnancy, but they are not always a sign that you are in labor. You may have practice contractions called Braxton Hicks contractions. These false labor contractions are sometimes confused with true labor. What are [place] contractions? Braxton Hicks contractions are tightening movements that occur in the muscles of the uterus before labor.  Unlike true labor contractions, these contractions do not result in opening (dilation) and thinning of the cervix. Toward the end of pregnancy (32-34 weeks), Braxton Hicks contractions can happen more often and may become stronger. These contractions are sometimes difficult to tell apart from true labor because they can be very uncomfortable. You should not feel embarrassed if you go to the hospital with false labor. Sometimes, the only way to tell if you are in true labor is for your health care provider to look for changes in the cervix. The health care provider will do a physical exam and may monitor your contractions. If you are not in true labor, the exam should show that your cervix is not dilating and your water has not broken. If there are no other health problems associated with your pregnancy, it is completely safe for you to be sent home with false labor. You may continue to have Braxton Hicks contractions until you go into true labor. How to tell the difference between true labor and false labor True labor  Contractions last 30-70 seconds.  Contractions become very regular.  Discomfort is usually felt in the top of the uterus, and it spreads to the lower abdomen and low back.  Contractions do not go away with walking.  Contractions usually become more intense and increase in frequency.  The cervix dilates and gets thinner. False labor  Contractions are usually shorter and not as strong as true labor contractions.  Contractions are usually irregular.  Contractions are often felt in the front of the lower abdomen and in the groin.  Contractions may go away when you walk around or change positions while lying down.  Contractions get weaker and are shorter-lasting as time goes on.  The cervix usually does not dilate or become thin. Follow these instructions at home:   Take over-the-counter and prescription medicines only as told by your health care provider.  Keep up with  your usual exercises and follow other instructions from your health care provider.  Eat and drink lightly if you think you are going into labor.  If Braxton Hicks contractions are making you uncomfortable: ? Change your position from lying down or resting to walking, or change from walking to resting. ? Sit and rest in a tub of warm water. ? Drink enough fluid to keep your urine pale yellow. Dehydration may cause these contractions. ? Do slow and deep breathing  several times an hour.  Keep all follow-up prenatal visits as told by your health care provider. This is important. Contact a health care provider if:  You have a fever.  You have continuous pain in your abdomen. Get help right away if:  Your contractions become stronger, more regular, and closer together.  You have fluid leaking or gushing from your vagina.  You pass blood-tinged mucus (bloody show).  You have bleeding from your vagina.  You have low back pain that you never had before.  You feel your baby's head pushing down and causing pelvic pressure.  Your baby is not moving inside you as much as it used to. Summary  Contractions that occur before labor are called Braxton Hicks contractions, false labor, or practice contractions.  Braxton Hicks contractions are usually shorter, weaker, farther apart, and less regular than true labor contractions. True labor contractions usually become progressively stronger and regular, and they become more frequent.  Manage discomfort from [facility] contractions by changing position, resting in a warm bath, drinking plenty of water, or practicing deep breathing. This information is not intended to replace advice given to you by your health care provider. Make sure you discuss any questions you have with your health care provider. Document Released: 08/16/2016 Document Revised: 03/15/2017 Document Reviewed: 08/16/2016 Elsevier Patient Education  [place].  Back Pain  in Pregnancy Back pain during pregnancy is common. Back pain may be caused by several factors that are related to changes during your pregnancy. Follow these instructions at home: Managing pain, stiffness, and swelling      If directed, for sudden (acute) back pain, put ice on the painful area. ? Put ice in a plastic bag. ? Place a towel between your skin and the bag. ? Leave the ice on for 20 minutes, 2-3 times per day.  If directed, apply heat to the affected area before you exercise. Use the heat source that your health care provider recommends, such as a moist heat pack or a heating pad. ? Place a towel between your skin and the heat source. ? Leave the heat on for 20-30 minutes. ? Remove the heat if your skin turns bright red. This is especially important if you are unable to feel pain, heat, or cold. You may have a greater risk of getting burned.  If directed, massage the affected area. Activity  Exercise as told by your health care provider. Gentle exercise is the best way to prevent or manage back pain.  Listen to your body when lifting. If lifting hurts, ask for help or bend your knees. This uses your leg muscles instead of your back muscles.  Squat down when picking up something from the floor. Do not bend over.  Only use bed rest for short periods as told by your health care provider. Bed rest should only be used for the most severe episodes of back pain. Standing, sitting, and lying down  Do not stand in one place for long periods of time.  Use good posture when sitting. Make sure your head rests over your shoulders and is not hanging forward. Use a pillow on your lower back if necessary.  Try sleeping on your side, preferably the left side, with a pregnancy support pillow or 1-2 regular pillows between your legs. ? If you have back pain after a night's rest, your bed may be too soft. ? A firm mattress may provide more support for your back during pregnancy. General  instructions  Do  not wear high heels.  Eat a healthy diet. Try to gain weight within your health care provider's recommendations.  Use a maternity girdle, elastic sling, or back brace as told by your health care provider.  Take over-the-counter and prescription medicines only as told by your health care provider.  Work with a physical therapist or massage therapist to find ways to manage back pain. Acupuncture or massage therapy may be helpful.  Keep all follow-up visits as told by your health care provider. This is important. Contact a health care provider if:  Your back pain interferes with your daily activities.  You have increasing pain in other parts of your body. Get help right away if:  You develop numbness, tingling, weakness, or problems with the use of your arms or legs.  You develop severe back pain that is not controlled with medicine.  You have a change in bowel or bladder control.  You develop shortness of breath, dizziness, or you faint.  You develop nausea, vomiting, or sweating.  You have back pain that is a rhythmic, cramping pain similar to labor pains. Labor pain is usually 1-2 minutes apart, lasts for about 1 minute, and involves a bearing down feeling or pressure in your pelvis.  You have back pain and your water breaks or you have vaginal bleeding.  You have back pain or numbness that travels down your leg.  Your back pain developed after you fell.  You develop pain on one side of your back.  You see blood in your urine.  You develop skin blisters in the area of your back pain. Summary  Back pain may be caused by several factors that are related to changes during your pregnancy.  Follow instructions as told by your health care provider for managing pain, stiffness, and swelling.  Exercise as told by your health care provider. Gentle exercise is the best way to prevent or manage back pain.  Take over-the-counter and prescription medicines only  as told by your health care provider.  Keep all follow-up visits as told by your health care provider. This is important. This information is not intended to replace advice given to you by your health care provider. Make sure you discuss any questions you have with your health care provider. Document Released: 07/11/2005 Document Revised: 07/22/2018 Document Reviewed: 09/18/2017 Elsevier Patient Education  2020 [employer].  Alcohol Use During Pregnancy  Drinking alcohol while you are pregnant can harm your unborn baby and cause long-term problems for your baby after birth. When you drink alcohol, it passes through your placenta to your baby. A baby's liver cannot process alcohol like an adult's liver can, because it is not mature enough. How does this affect me? Drinking alcohol during pregnancy can increase your risk of:  Miscarriage.  Stillbirth.  Premature delivery.  Injury from falls.  Domestic violence.  Mood disorders, such as depression and postpartum depression. How does this affect my baby? Drinking alcohol during pregnancy can cause fetal alcohol spectrum disorder in your unborn baby (fetus). You are at the highest risk of having a baby affected by alcohol if you have 7 or more drinks a week through most of your pregnancy. Fetal alcohol spectrum disorder (FASD) is a group of birth defects that can occur in a child whose mother drank excessive amounts of alcohol during pregnancy. Defects can include:  Behavior and attention problems.  Disorders of the brain and spinal cord (central nervous system disorders).  Problems with the heart, kidneys, and bones.  Decreased growth before and after birth.  Learning problems and intellectual disabilities.  Speech and language problems.  Vision and hearing problems.  Smaller head size than normal.  Sleep and sucking disorders.  Changes in the shape of the face. The only way to prevent the condition is to stop drinking  alcohol the moment you realize that you are pregnant. Can I have any alcohol during my pregnancy? No amount of alcohol is safe during pregnancy. Because of this, it is best not to consume any alcohol at all. Do not drink alcohol if:  You plan to become pregnant.  You think you may be pregnant. If you are struggling not to drink, ask your health care provider for help. If you have a drinking problem, the sooner you get help to stop, the better your chance of having a healthy baby. Where to find more information  [employer] on Alcohol Abuse and Alcoholism: http://www.bradshaw.com/  Substance Abuse and Mental Health Services Administration: www.fascenter.SamedayNews.com.cy Summary  Drinking alcohol during your pregnancy can cause life-long harm to your developing baby.  No amount of alcohol is safe during pregnancy.  Stop drinking alcohol if you think you are pregnant or could become pregnant.  If you are pregnant and you are not able to stop drinking alcohol, talk with your health care provider right away. This information is not intended to replace advice given to you by your health care provider. Make sure you discuss any questions you have with your health care provider. Document Released: 01/28/2007 Document Revised: 07/22/2018 Document Reviewed: 12/06/2016 Elsevier Patient Education  2020 [employer].  Activity Restriction During Pregnancy Your health care provider may recommend specific activity restrictions during pregnancy for a variety of reasons. Activity restriction may require that you limit activities that require great effort, such as exercise, lifting, or sex. The type of activity restriction will vary for each person, depending on your risk or the problems you are having. Activity restriction may be recommended for a period of time until your baby is delivered. Why are activity restrictions recommended? Activity restriction may be recommended if:  Your placenta is partially  or completely covering the opening of your cervix (placenta previa).  There is bleeding between the wall of the uterus and the amniotic sac in the first trimester of pregnancy (subchorionic hemorrhage).  You went into labor too early (preterm labor).  You have a history of miscarriage.  You have a condition that causes high blood pressure during pregnancy (preeclampsia or eclampsia).  You are pregnant with more than one baby.  Your baby is not growing well. What are the risks? The risks depend on your specific restriction. Strict bed rest has the most physical and emotional risks and is no longer routinely recommended. Risks of strict bed rest include:  Loss of muscle conditioning from not moving.  Blood clots.  Social isolation.  Depression.  Loss of income. Talk with your health care team about activity restriction to decide if it is best for you and your baby. Even if you are having problems during your pregnancy, you may be able to continue with normal levels of activity with careful monitoring by your health care team. Follow these instructions at home: If needed, based on your overall health and the health of your baby, your health care provider will decide which type of activity restriction is right for you. Activity restrictions may include:  Not lifting anything heavier than 10 pounds (4.5 kg).  Avoiding activities that take a lot of physical  effort.  No lifting or straining.  Resting in a sitting position or lying down for periods of time during the day. Pelvic rest may be recommended along with activity restrictions. If pelvic rest is recommended, then:  Do not have sex, an orgasm, or use sexual stimulation.  Do not use tampons. Do not douche. Do not put anything into your vagina.  Do not lift anything that is heavier than 10 lb (4.5 kg).  Avoid activities that require a lot of effort.  Avoid any activity in which your pelvic muscles could become strained,  such as squatting. Questions to ask your health care provider  Why is my activity being limited?  How will activity restrictions affect my body?  Why is rest helpful for me and my baby?  What activities can I do?  When can I return to normal activities? When should I seek immediate medical care? Seek immediate medical care if you have:  Vaginal bleeding.  Vaginal discharge.  Cramping pain in your lower abdomen.  Regular contractions.  A low, dull backache. Summary  Your health care provider may recommend specific activity restrictions during pregnancy for a variety of reasons.  Activity restriction may require that you limit activities such as exercise, lifting, sex, or any other activity that requires great effort.  Discuss the risks and benefits of activity restriction with your health care team to decide if it is best for you and your baby.  Contact your health care provider right away if you think you are having contractions, or if you notice vaginal bleeding, discharge, or cramping. This information is not intended to replace advice given to you by your health care provider. Make sure you discuss any questions you have with your health care provider. Document Released: 07/28/2010 Document Revised: 07/23/2017 Document Reviewed: 07/23/2017 Elsevier Patient Education  [employer].  Abdominal Pain During Pregnancy  Abdominal pain is common during pregnancy, and has many possible causes. Some causes are more serious than others, and sometimes the cause is not known. Abdominal pain can be a sign that labor is starting. It can also be caused by normal growth and stretching of muscles and ligaments during pregnancy. Always tell your health care provider if you have any abdominal pain. Follow these instructions at home:  Do not have sex or put anything in your vagina until your pain goes away completely.  Get plenty of rest until your pain improves.  Drink enough  fluid to keep your urine pale yellow.  Take over-the-counter and prescription medicines only as told by your health care provider.  Keep all follow-up visits as told by your health care provider. This is important. Contact a health care provider if:  Your pain continues or gets worse after resting.  You have lower abdominal pain that: ? Comes and goes at regular intervals. ? Spreads to your back. ? Is similar to menstrual cramps.  You have pain or burning when you urinate. Get help right away if:  You have a fever or chills.  You have vaginal bleeding.  You are leaking fluid from your vagina.  You are passing tissue from your vagina.  You have vomiting or diarrhea that lasts for more than 24 hours.  Your baby is moving less than usual.  You feel very weak or faint.  You have shortness of breath.  You develop severe pain in your upper abdomen. Summary  Abdominal pain is common during pregnancy, and has many possible causes.  If you experience abdominal pain  during pregnancy, tell your health care provider right away.  Follow your health care provider's home care instructions and keep all follow-up visits as directed. This information is not intended to replace advice given to you by your health care provider. Make sure you discuss any questions you have with your health care provider. Document Released: 04/02/2005 Document Revised: 07/21/2018 Document Reviewed: 07/05/2016 Elsevier Patient Education  2020 [employer].

## 2018-12-08 NOTE — Progress Notes (Signed)
Acute Office Visit  Subjective:    Patient ID: Terri Combs, female    DOB: [birth year], 23 y.o.   MRN: [id number]  Chief Complaint  Patient presents with  . Nausea    with some light-headedness and dizziness.  Did have come vomiting  . Menstrual Problem    x 2 months     HPI Patient is in today for amenorrhea x 2 months. LMP mid June (unsure).  In stable relationship. Denies IPV. Does not use BCM. Has had some morning nausea, dizziness. Some mild vomiting. Took an at home pregnancy test last week - negative.  Past Medical History:  Diagnosis Date  . Migraine with aura   . Scoliosis   . Vaginal yeast infection     Past Surgical History:  Procedure Laterality Date  . NO PAST SURGERIES      Family History  Problem Relation Age of Onset  . Migraines Mother     Social History   Socioeconomic History  . Marital status: Single    Spouse name: Not on file  . Number of children: 0  . Years of education: 4  . Highest education level: Not on file  Occupational History  . Occupation: Designer, multimedia  Social Needs  . Financial resource strain: Not on file  . Food insecurity    Worry: Never true    Inability: Never true  . Transportation needs    Medical: No    Non-medical: No  Tobacco Use  . Smoking status: Never Smoker  . Smokeless tobacco: Never Used  Substance and Sexual Activity  . Alcohol use: No  . Drug use: No  . Sexual activity: Yes    Partners: Male    Birth control/protection: None  Lifestyle  . Physical activity    Days per week: Not on file    Minutes per session: Not on file  . Stress: Not on file  Relationships  . Social Herbalist on phone: Not on file    Gets together: Not on file    Attends religious service: Not on file    Active member of club or organization: Not on file    Attends meetings of clubs or organizations: Not on file    Relationship status: Not on file  . Intimate partner violence    Fear of current or ex partner: Not on  file    Emotionally abused: Not on file    Physically abused: Not on file    Forced sexual activity: Not on file  Other Topics Concern  . Not on file  Social History Narrative   Lives at home w/ her parents   Right-handed   Caffeine: soda daily    Outpatient Medications Prior to Visit  Medication Sig Dispense Refill  . meclizine (ANTIVERT) 12.5 MG tablet Take 1 tablet (12.5 mg total) by mouth 3 (three) times daily as needed for dizziness. 30 tablet 0  . SUMAtriptan (IMITREX) 50 MG tablet Take 1 tablet (50 mg total) by mouth every 2 (two) hours as needed for migraine. May repeat in 2 hours. Max of 2 tabs per day. 10 tablet 5   No facility-administered medications prior to visit.     No Known Allergies  ROS Per hpi     Objective:    Physical Exam  Constitutional: She is oriented to person, place, and time. She appears well-developed and well-nourished.  Cardiovascular: Normal rate and regular rhythm.  Pulmonary/Chest: Effort normal. No respiratory distress.  Neurological: She  is alert and oriented to person, place, and time.  Skin: Skin is warm and dry.  Psychiatric: She has a normal mood and affect. Her behavior is normal. Judgment and thought content normal.  Nursing note and vitals reviewed.   BP 114/72   Pulse 77   Temp 97.9 F (36.6 C) (Oral)   Resp 16   Ht 5' 1.81" (1.57 m)   Wt 147 lb (66.7 kg)   SpO2 96%   BMI 27.05 kg/m  Wt Readings from Last 3 Encounters:  12/08/18 147 lb (66.7 kg)  11/27/18 148 lb (67.1 kg)  05/12/18 138 lb (62.6 kg)    Health Maintenance Due  Topic Date Due  . CHLAMYDIA SCREENING  10/24/2018  . INFLUENZA VACCINE  11/15/2018    There are no preventive care reminders to display for this patient.   Lab Results  Component Value Date   TSH 0.707 04/24/2018   Lab Results  Component Value Date   WBC 8.3 02/10/2018   HGB 14.7 (A) 02/10/2018   HCT 44.0 (A) 02/10/2018   MCV 78.8 02/10/2018   PLT 226 05/04/2017   Lab Results   Component Value Date   NA 140 07/09/2017   K 4.0 07/09/2017   CO2 22 05/04/2017   GLUCOSE 78 07/09/2017   BUN 14 07/09/2017   CREATININE 0.80 07/09/2017   BILITOT 0.9 05/04/2017   ALKPHOS 54 05/04/2017   AST 18 05/04/2017   ALT 13 (L) 05/04/2017   PROT 7.0 05/04/2017   ALBUMIN 3.9 05/04/2017   CALCIUM 8.4 (L) 05/04/2017   ANIONGAP 11 05/04/2017   Lab Results  Component Value Date   CHOL 139 05/05/2016   Lab Results  Component Value Date   HDL 59 05/05/2016   Lab Results  Component Value Date   LDLCALC 68 05/05/2016   Lab Results  Component Value Date   TRIG 60 05/05/2016   Lab Results  Component Value Date   CHOLHDL 2.4 05/05/2016   Lab Results  Component Value Date   HGBA1C 5.2 04/24/2018       Assessment & Plan:   Problem List Items Addressed This Visit      Other   Pregnant   Relevant Orders   Beta hCG quant (ref lab)   CBC with Differential   CMP14+EGFR   Hemoglobin A1c   ABO/Rh   HepB+HepC+HIV Panel   RPR   Rubella Antibody, IGM   Ambulatory referral to Obstetrics / Gynecology    Other Visit Diagnoses    Lightheadedness    -  Primary   Relevant Orders   POCT urinalysis dipstick (Completed)   Missed period       Relevant Orders   POCT urinalysis dipstick (Completed)   POCT urine pregnancy (Completed)       No orders of the defined types were placed in this encounter.   PLAN  POCT urine preg test positive  Pt "shocked", but not disappointed - has briefly discussed having a child with current partner, who is supportive.   She reasonably had questions regarding pregnancy that I answered to the best of my ability - though we discussed that as an AGPCNP, pregnancy is outside my scope of practice. She will receive an OBGYN referral today to establish  We were able to collect some prenatal labs to facilitate transition to OBGYN Care: HIV, Hepatitis panel, CBC, CMP, UA Dipstick, blood typing w/ Rh, A1c, RPR, and Rubella IgM titer.   She  was provided with prenatal educational  materials  Patient encouraged to call clinic with any questions, comments, or concerns.   Maximiano Coss, NP

## 2018-12-08 NOTE — Telephone Encounter (Signed)
Called and left patient a detailed message to call our office back so we can get her scheduled.

## 2018-12-09 ENCOUNTER — Encounter: Payer: Self-pay | Admitting: Registered Nurse

## 2018-12-09 LAB — CBC WITH DIFFERENTIAL/PLATELET
Basophils Absolute: 0.1 x10E3/uL (ref 0.0–0.2)
Basos: 1 %
EOS (ABSOLUTE): 0.1 x10E3/uL (ref 0.0–0.4)
Eos: 1 %
Hematocrit: 41.4 % (ref 34.0–46.6)
Hemoglobin: 13.3 g/dL (ref 11.1–15.9)
Immature Grans (Abs): 0.1 x10E3/uL (ref 0.0–0.1)
Immature Granulocytes: 1 %
Lymphocytes Absolute: 2.3 x10E3/uL (ref 0.7–3.1)
Lymphs: 20 %
MCH: 25.8 pg — ABNORMAL LOW (ref 26.6–33.0)
MCHC: 32.1 g/dL (ref 31.5–35.7)
MCV: 80 fL (ref 79–97)
Monocytes Absolute: 1 x10E3/uL — ABNORMAL HIGH (ref 0.1–0.9)
Monocytes: 9 %
Neutrophils Absolute: 8.1 x10E3/uL — ABNORMAL HIGH (ref 1.4–7.0)
Neutrophils: 68 %
Platelets: 279 x10E3/uL (ref 150–450)
RBC: 5.15 x10E6/uL (ref 3.77–5.28)
RDW: 12 % (ref 11.7–15.4)
WBC: 11.6 x10E3/uL — ABNORMAL HIGH (ref 3.4–10.8)

## 2018-12-09 LAB — CMP14+EGFR
ALT: 11 IU/L (ref 0–32)
AST: 14 IU/L (ref 0–40)
Albumin/Globulin Ratio: 1.5 (ref 1.2–2.2)
Albumin: 4.2 g/dL (ref 3.9–5.0)
Alkaline Phosphatase: 63 IU/L (ref 39–117)
BUN/Creatinine Ratio: 8 — ABNORMAL LOW (ref 9–23)
BUN: 7 mg/dL (ref 6–20)
Bilirubin Total: 0.4 mg/dL (ref 0.0–1.2)
CO2: 20 mmol/L (ref 20–29)
Calcium: 9.1 mg/dL (ref 8.7–10.2)
Chloride: 102 mmol/L (ref 96–106)
Creatinine, Ser: 0.87 mg/dL (ref 0.57–1.00)
GFR calc Af Amer: 109 mL/min/1.73
GFR calc non Af Amer: 94 mL/min/1.73
Globulin, Total: 2.8 g/dL (ref 1.5–4.5)
Glucose: 84 mg/dL (ref 65–99)
Potassium: 4.3 mmol/L (ref 3.5–5.2)
Sodium: 136 mmol/L (ref 134–144)
Total Protein: 7 g/dL (ref 6.0–8.5)

## 2018-12-09 LAB — HEPB+HEPC+HIV PANEL
HIV Screen 4th Generation wRfx: NONREACTIVE
Hep B C IgM: NEGATIVE
Hep B Core Total Ab: NEGATIVE
Hep B E Ab: NEGATIVE
Hep B E Ag: NEGATIVE
Hep B Surface Ab, Qual: NONREACTIVE
Hep C Virus Ab: <0.1 s/co ratio (ref 0.0–0.9)
Hepatitis B Surface Ag: NEGATIVE

## 2018-12-09 LAB — BETA HCG QUANT (REF LAB): hCG Quant: 76800 m[IU]/mL

## 2018-12-09 LAB — SYPHILIS: RPR W/REFLEX TO RPR TITER AND TREPONEMAL ANTIBODIES, TRADITIONAL SCREENING AND DIAGNOSIS ALGORITHM: RPR Ser Ql: NONREACTIVE

## 2018-12-09 LAB — HEMOGLOBIN A1C
Est. average glucose Bld gHb Est-mCnc: 105 mg/dL
Hgb A1c MFr Bld: 5.3 % (ref 4.8–5.6)

## 2018-12-09 LAB — ABO/RH: Rh Factor: POSITIVE

## 2018-12-09 LAB — RUBELLA ANTIBODY, IGM: Rubella IgM: <20 [AU]/ml (ref 0.0–19.9)

## 2018-12-09 NOTE — Progress Notes (Signed)
Results letter sent to pt via MyChart  Terri Arilynn Blakeney, NP 

## 2018-12-10 ENCOUNTER — Ambulatory Visit: Payer: Managed Care, Other (non HMO) | Admitting: Registered Nurse

## 2018-12-10 ENCOUNTER — Other Ambulatory Visit: Payer: Self-pay

## 2018-12-10 ENCOUNTER — Inpatient Hospital Stay (HOSPITAL_COMMUNITY)
Admission: EM | Admit: 2018-12-10 | Discharge: 2018-12-11 | Disposition: A | Discharge status: Home / Self Care | Payer: Managed Care, Other (non HMO) | Attending: Obstetrics & Gynecology | Admitting: Obstetrics & Gynecology

## 2018-12-10 DIAGNOSIS — R110 Nausea: Secondary | ICD-10-CM | POA: Insufficient documentation

## 2018-12-10 DIAGNOSIS — O26891 Other specified pregnancy related conditions, first trimester: Secondary | ICD-10-CM

## 2018-12-10 DIAGNOSIS — Z3A10 10 weeks gestation of pregnancy: Secondary | ICD-10-CM | POA: Insufficient documentation

## 2018-12-10 DIAGNOSIS — O9989 Other specified diseases and conditions complicating pregnancy, childbirth and the puerperium: Secondary | ICD-10-CM | POA: Insufficient documentation

## 2018-12-10 DIAGNOSIS — R109 Unspecified abdominal pain: Secondary | ICD-10-CM | POA: Insufficient documentation

## 2018-12-10 DIAGNOSIS — Z3A01 Less than 8 weeks gestation of pregnancy: Secondary | ICD-10-CM

## 2018-12-10 DIAGNOSIS — R42 Dizziness and giddiness: Secondary | ICD-10-CM | POA: Insufficient documentation

## 2018-12-10 DIAGNOSIS — M419 Scoliosis, unspecified: Secondary | ICD-10-CM | POA: Insufficient documentation

## 2018-12-11 ENCOUNTER — Encounter (HOSPITAL_COMMUNITY): Payer: Self-pay

## 2018-12-11 ENCOUNTER — Inpatient Hospital Stay (HOSPITAL_COMMUNITY): Payer: Managed Care, Other (non HMO)

## 2018-12-11 ENCOUNTER — Encounter: Payer: Self-pay | Admitting: Registered Nurse

## 2018-12-11 DIAGNOSIS — M419 Scoliosis, unspecified: Secondary | ICD-10-CM | POA: Diagnosis not present

## 2018-12-11 DIAGNOSIS — Z3A10 10 weeks gestation of pregnancy: Secondary | ICD-10-CM

## 2018-12-11 DIAGNOSIS — R42 Dizziness and giddiness: Secondary | ICD-10-CM | POA: Diagnosis not present

## 2018-12-11 DIAGNOSIS — R110 Nausea: Secondary | ICD-10-CM | POA: Diagnosis not present

## 2018-12-11 DIAGNOSIS — O9989 Other specified diseases and conditions complicating pregnancy, childbirth and the puerperium: Secondary | ICD-10-CM | POA: Diagnosis not present

## 2018-12-11 DIAGNOSIS — O26891 Other specified pregnancy related conditions, first trimester: Secondary | ICD-10-CM

## 2018-12-11 DIAGNOSIS — R109 Unspecified abdominal pain: Secondary | ICD-10-CM | POA: Diagnosis not present

## 2018-12-11 LAB — WET PREP, GENITAL
Clue Cells Wet Prep HPF POC: NONE SEEN
Sperm: NONE SEEN
Trich, Wet Prep: NONE SEEN
WBC, Wet Prep HPF POC: NONE SEEN
Yeast Wet Prep HPF POC: NONE SEEN

## 2018-12-11 LAB — URINALYSIS, ROUTINE W REFLEX MICROSCOPIC
Bilirubin Urine: NEGATIVE
Glucose, UA: NEGATIVE mg/dL
Hgb urine dipstick: NEGATIVE
Ketones, ur: 20 mg/dL — AB
Leukocytes,Ua: NEGATIVE
Nitrite: NEGATIVE
Protein, ur: NEGATIVE mg/dL
Specific Gravity, Urine: 1.03 (ref 1.005–1.030)
pH: 5 (ref 5.0–8.0)

## 2018-12-11 LAB — GLUCOSE, CAPILLARY: Glucose-Capillary: 89 mg/dL (ref 70–99)

## 2018-12-11 MED ORDER — PREPLUS 27-1 MG PO TABS: 1.0000 | ORAL_TABLET | Freq: Once | ORAL | 3 refills | Status: AC

## 2018-12-11 MED ORDER — PROMETHAZINE HCL 12.5 MG PO TABS: 12.5000 mg | ORAL_TABLET | Freq: Four times a day (QID) | ORAL | 0 refills | Status: DC | PRN

## 2018-12-11 NOTE — MAU Note (Signed)
Feeling lightheaded and dizzy and some abdominal cramping that started when she found after finding out she was pregnant.  Some spotting.  No other discharge.

## 2018-12-11 NOTE — Progress Notes (Signed)
Work note for patient, first pregnancy, seen in ED for cramping and spotting.  Kathrin Ruddy, NP

## 2018-12-11 NOTE — Discharge Instructions (Signed)
First Trimester of Pregnancy °The first trimester of pregnancy is from week 1 until the end of week 13 (months 1 through 3). A week after a sperm fertilizes an egg, the egg will implant on the wall of the uterus. This embryo will begin to develop into a baby. Genes from you and your partner will form the baby. The female genes will determine whether the baby will be a boy or a girl. At 6-8 weeks, the eyes and face will be formed, and the heartbeat can be seen on ultrasound. At the end of 12 weeks, all the baby's organs will be formed. °Now that you are pregnant, you will want to do everything you can to have a healthy baby. Two of the most important things are to get good prenatal care and to follow your health care provider's instructions. Prenatal care is all the medical care you receive before the baby's birth. This care will help prevent, find, and treat any problems during the pregnancy and childbirth. °Body changes during your first trimester °Your body goes through many changes during pregnancy. The changes vary from woman to woman. °· You may gain or lose a couple of pounds at first. °· You may feel sick to your stomach (nauseous) and you may throw up (vomit). If the vomiting is uncontrollable, call your health care provider. °· You may tire easily. °· You may develop headaches that can be relieved by medicines. All medicines should be approved by your health care provider. °· You may urinate more often. Painful urination may mean you have a bladder infection. °· You may develop heartburn as a result of your pregnancy. °· You may develop constipation because certain hormones are causing the muscles that push stool through your intestines to slow down. °· You may develop hemorrhoids or swollen veins (varicose veins). °· Your breasts may begin to grow larger and become tender. Your nipples may stick out more, and the tissue that surrounds them (areola) may become darker. °· Your gums may bleed and may be  sensitive to brushing and flossing. °· Dark spots or blotches (chloasma, mask of pregnancy) may develop on your face. This will likely fade after the baby is born. °· Your menstrual periods will stop. °· You may have a loss of appetite. °· You may develop cravings for certain kinds of food. °· You may have changes in your emotions from day to day, such as being excited to be pregnant or being concerned that something may go wrong with the pregnancy and baby. °· You may have more vivid and strange dreams. °· You may have changes in your hair. These can include thickening of your hair, rapid growth, and changes in texture. Some women also have hair loss during or after pregnancy, or hair that feels dry or thin. Your hair will most likely return to normal after your baby is born. °What to expect at prenatal visits °During a routine prenatal visit: °· You will be weighed to make sure you and the baby are growing normally. °· Your blood pressure will be taken. °· Your abdomen will be measured to track your baby's growth. °· The fetal heartbeat will be listened to between weeks 10 and 14 of your pregnancy. °· Test results from any previous visits will be discussed. °Your health care provider may ask you: °· How you are feeling. °· If you are feeling the baby move. °· If you have had any abnormal symptoms, such as leaking fluid, bleeding, severe headaches, or abdominal   cramping. °· If you are using any tobacco products, including cigarettes, chewing tobacco, and electronic cigarettes. °· If you have any questions. °Other tests that may be performed during your first trimester include: °· Blood tests to find your blood type and to check for the presence of any previous infections. The tests will also be used to check for low iron levels (anemia) and protein on red blood cells (Rh antibodies). Depending on your risk factors, or if you previously had diabetes during pregnancy, you may have tests to check for high blood sugar  that affects pregnant women (gestational diabetes). °· Urine tests to check for infections, diabetes, or protein in the urine. °· An ultrasound to confirm the proper growth and development of the baby. °· Fetal screens for spinal cord problems (spina bifida) and Down syndrome. °· HIV (human immunodeficiency virus) testing. Routine prenatal testing includes screening for HIV, unless you choose not to have this test. °· You may need other tests to make sure you and the baby are doing well. °Follow these instructions at home: °Medicines °· Follow your health care provider's instructions regarding medicine use. Specific medicines may be either safe or unsafe to take during pregnancy. °· Take a prenatal vitamin that contains at least 600 micrograms (mcg) of folic acid. °· If you develop constipation, try taking a stool softener if your health care provider approves. °Eating and drinking ° °· Eat a balanced diet that includes fresh fruits and vegetables, whole grains, good sources of protein such as meat, eggs, or tofu, and low-fat dairy. Your health care provider will help you determine the amount of weight gain that is right for you. °· Avoid raw meat and uncooked cheese. These carry germs that can cause birth defects in the baby. °· Eating four or five small meals rather than three large meals a day may help relieve nausea and vomiting. If you start to feel nauseous, eating a few soda crackers can be helpful. Drinking liquids between meals, instead of during meals, also seems to help ease nausea and vomiting. °· Limit foods that are high in fat and processed sugars, such as fried and sweet foods. °· To prevent constipation: °? Eat foods that are high in fiber, such as fresh fruits and vegetables, whole grains, and beans. °? Drink enough fluid to keep your urine clear or pale yellow. °Activity °· Exercise only as directed by your health care provider. Most women can continue their usual exercise routine during  pregnancy. Try to exercise for 30 minutes at least 5 days a week. Exercising will help you: °? Control your weight. °? Stay in shape. °? Be prepared for labor and delivery. °· Experiencing pain or cramping in the lower abdomen or lower back is a good sign that you should stop exercising. Check with your health care provider before continuing with normal exercises. °· Try to avoid standing for long periods of time. Move your legs often if you must stand in one place for a long time. °· Avoid heavy lifting. °· Wear low-heeled shoes and practice good posture. °· You may continue to have sex unless your health care provider tells you not to. °Relieving pain and discomfort °· Wear a good support bra to relieve breast tenderness. °· Take warm sitz baths to soothe any pain or discomfort caused by hemorrhoids. Use hemorrhoid cream if your health care provider approves. °· Rest with your legs elevated if you have leg cramps or low back pain. °· If you develop varicose veins in   your legs, wear support hose. Elevate your feet for 15 minutes, 3-4 times a day. Limit salt in your diet. Prenatal care  Schedule your prenatal visits by the twelfth week of pregnancy. They are usually scheduled monthly at first, then more often in the last 2 months before delivery.  Write down your questions. Take them to your prenatal visits.  Keep all your prenatal visits as told by your health care provider. This is important. Safety  Wear your seat belt at all times when driving.  Make a list of emergency phone numbers, including numbers for family, friends, the hospital, and police and fire departments. General instructions  Ask your health care provider for a referral to a local prenatal education class. Begin classes no later than the beginning of month 6 of your pregnancy.  Ask for help if you have counseling or nutritional needs during pregnancy. Your health care provider can offer advice or refer you to specialists for help  with various needs.  Do not use hot tubs, steam rooms, or saunas.  Do not douche or use tampons or scented sanitary pads.  Do not cross your legs for long periods of time.  Avoid cat litter boxes and soil used by cats. These carry germs that can cause birth defects in the baby and possibly loss of the fetus by miscarriage or stillbirth.  Avoid all smoking, herbs, alcohol, and medicines not prescribed by your health care provider. Chemicals in these products affect the formation and growth of the baby.  Do not use any products that contain nicotine or tobacco, such as cigarettes and e-cigarettes. If you need help quitting, ask your health care provider. You may receive counseling support and other resources to help you quit.  Schedule a dentist appointment. At home, brush your teeth with a soft toothbrush and be gentle when you floss. Contact a health care provider if:  You have dizziness.  You have mild pelvic cramps, pelvic pressure, or nagging pain in the abdominal area.  You have persistent nausea, vomiting, or diarrhea.  You have a bad smelling vaginal discharge.  You have pain when you urinate.  You notice increased swelling in your face, hands, legs, or ankles.  You are exposed to fifth disease or chickenpox.  You are exposed to [place] measles (rubella) and have never had it. Get help right away if:  You have a fever.  You are leaking fluid from your vagina.  You have spotting or bleeding from your vagina.  You have severe abdominal cramping or pain.  You have rapid weight gain or loss.  You vomit blood or material that looks like coffee grounds.  You develop a severe headache.  You have shortness of breath.  You have any kind of trauma, such as from a fall or a car accident. Summary  The first trimester of pregnancy is from week 1 until the end of week 13 (months 1 through 3).  Your body goes through many changes during pregnancy. The changes vary from  woman to woman.  You will have routine prenatal visits. During those visits, your health care provider will examine you, discuss any test results you may have, and talk with you about how you are feeling. This information is not intended to replace advice given to you by your health care provider. Make sure you discuss any questions you have with your health care provider. Document Released: 03/27/2001 Document Revised: 03/15/2017 Document Reviewed: 03/14/2016 Elsevier Patient Education  2020 [employer] Round Ligament Pain  The round ligament is a cord of muscle and tissue that helps support the uterus. It can become a source of pain during pregnancy if it becomes stretched or twisted as the baby grows. The pain usually begins in the second trimester (13-28 weeks) of pregnancy, and it can come and go until the baby is delivered. It is not a serious problem, and it does not cause harm to the baby. Round ligament pain is usually a short, sharp, and pinching pain, but it can also be a dull, lingering, and aching pain. The pain is felt in the lower side of the abdomen or in the groin. It usually starts deep in the groin and moves up to the outside of the hip area. The pain may occur when you:  Suddenly change position, such as quickly going from a sitting to standing position.  Roll over in bed.  Cough or sneeze.  Do physical activity. Follow these instructions at home:   Watch your condition for any changes.  When the pain starts, relax. Then try any of these methods to help with the pain: ? Sitting down. ? Flexing your knees up to your abdomen. ? Lying on your side with one pillow under your abdomen and another pillow between your legs. ? Sitting in a warm bath for 15-20 minutes or until the pain goes away.  Take over-the-counter and prescription medicines only as told by your health care provider.  Move slowly when you sit down or stand up.  Avoid long walks if they cause  pain.  Stop or reduce your physical activities if they cause pain.  Keep all follow-up visits as told by your health care provider. This is important. Contact a health care provider if:  Your pain does not go away with treatment.  You feel pain in your back that you did not have before.  Your medicine is not helping. Get help right away if:  You have a fever or chills.  You develop uterine contractions.  You have vaginal bleeding.  You have nausea or vomiting.  You have diarrhea.  You have pain when you urinate. Summary  Round ligament pain is felt in the lower abdomen or groin. It is usually a short, sharp, and pinching pain. It can also be a dull, lingering, and aching pain.  This pain usually begins in the second trimester (13-28 weeks). It occurs because the uterus is stretching with the growing baby, and it is not harmful to the baby.  You may notice the pain when you suddenly change position, when you cough or sneeze, or during physical activity.  Relaxing, flexing your knees to your abdomen, lying on one side, or taking a warm bath may help to get rid of the pain.  Get help from your health care provider if the pain does not go away or if you have vaginal bleeding, nausea, vomiting, diarrhea, or painful urination. This information is not intended to replace advice given to you by your health care provider. Make sure you discuss any questions you have with your health care provider. Document Released: 01/10/2008 Document Revised: 09/18/2017 Document Reviewed: 09/18/2017 Elsevier Patient Education  2020 [employer].

## 2018-12-11 NOTE — MAU Provider Note (Signed)
History     CSN: [id number]  Arrival date and time: 12/10/18 2337   None     Chief Complaint  Patient presents with  . Abdominal Pain  . Dizziness   Terri Combs is a 23 y.o. G1P0 at [redacted]w[redacted]d by unsure LMP of September 28, 2018.  She reports that she discovered she was pregnant 2 days ago at her primary care office.  She presents today for Abdominal Pain and Dizziness/Lightheadedness.  She states the dizziness and lightheadedness started over the weekend and has progressed over the past 2 days.  She endorses a history of migraines and takes medication as needed. She states she gets symptoms regardless of position, but has never passed out.  Patient reports she has not eaten since 12pm and last had something to drink around 4-5pm.  She states she has been experiencing nausea, but no vomiting.  She contributes this to her inability to eat and drink regularly and states she has noticed a decreased appetite.  Patient reports that she is having intermittent sharp abdominal pain that is present with laying down.  She states the pain radiates to her leg and arm, on her left side and lasts a few seconds.  She states she has not taken any medication for the pain stating that it is "more like a discomfort than an excruciating pain."  She reports spotting that occurred yesterday and describes it as "not bright red blood, but just droplets that is not enough fill up a pantyliner."  She reports sexual activity in the last 3 days, but denies pain or discomfort. She also reports back pain that started a couple weeks ago and she has not taken anything for it and declines treatment today.    OB History    Gravida  1   Para  0   Term  0   Preterm  0   AB  0   Living  0     SAB  0   TAB  0   Ectopic  0   Multiple  0   Live Births  0           Past Medical History:  Diagnosis Date  . Migraine with aura   . Scoliosis   . Vaginal yeast infection     Past Surgical History:  Procedure Laterality  Date  . NO PAST SURGERIES      Family History  Problem Relation Age of Onset  . Migraines Mother     Social History   Tobacco Use  . Smoking status: Never Smoker  . Smokeless tobacco: Never Used  Substance Use Topics  . Alcohol use: No  . Drug use: No    Allergies: No Known Allergies  Medications Prior to Admission  Medication Sig Dispense Refill Last Dose  . meclizine (ANTIVERT) 12.5 MG tablet Take 1 tablet (12.5 mg total) by mouth 3 (three) times daily as needed for dizziness. 30 tablet 0   . SUMAtriptan (IMITREX) 50 MG tablet Take 1 tablet (50 mg total) by mouth every 2 (two) hours as needed for migraine. May repeat in 2 hours. Max of 2 tabs per day. 10 tablet 5     Review of Systems  Constitutional: Negative for chills and fever.  Respiratory: Negative for cough.   Gastrointestinal: Positive for abdominal pain. Negative for constipation, diarrhea, nausea and vomiting.  Genitourinary: Negative for difficulty urinating, dysuria, pelvic pain, vaginal bleeding and vaginal discharge.  Musculoskeletal: Positive for back pain.  Neurological: Positive for  dizziness, light-headedness and headaches (Earlier, but resolved without intervention. ).   Physical Exam   Blood pressure 110/85, pulse 77, temperature 98.9 F (37.2 C), resp. rate 17, weight 66.4 kg, last menstrual period 09/28/2018.  Physical Exam  Constitutional: She is oriented to person, place, and time. She appears well-developed and well-nourished.  HENT:  Head: Normocephalic and atraumatic.  Eyes: Conjunctivae are normal.  Neck: Normal range of motion.  Cardiovascular: Normal rate, regular rhythm and normal heart sounds.  Respiratory: Effort normal and breath sounds normal.  GI: Soft. Bowel sounds are normal. There is abdominal tenderness.  Genitourinary:    No vaginal discharge or bleeding.  No bleeding in the vagina.    Genitourinary Comments: Speculum Exam: -Normal External Genitalia: Non tender, no  apparent discharge at introitus.  -Vaginal Vault: Pink mucosa with good rugae. Scant amt white discharge -wet prep collected -Cervix:Pink, no lesions, cysts, or polyps.  Appears closed. No active bleeding from os-GC/CT collected -Bimanual Exam:  Mild tenderness in cul de sac.     Musculoskeletal: Normal range of motion.  Neurological: She is alert and oriented to person, place, and time.  Skin: Skin is warm and dry.  Psychiatric: She has a normal mood and affect. Her behavior is normal.    MAU Course  Procedures Results for orders placed or performed during the hospital encounter of 12/10/18 (from the past 24 hour(s))  Urinalysis, Routine w reflex microscopic     Status: Abnormal   Collection Time: 12/11/18 12:42 AM  Result Value Ref Range   Color, Urine YELLOW YELLOW   APPearance CLEAR CLEAR   Specific Gravity, Urine 1.030 1.005 - 1.030   pH 5.0 5.0 - 8.0   Glucose, UA NEGATIVE NEGATIVE mg/dL   Hgb urine dipstick NEGATIVE NEGATIVE   Bilirubin Urine NEGATIVE NEGATIVE   Ketones, ur 20 (A) NEGATIVE mg/dL   Protein, ur NEGATIVE NEGATIVE mg/dL   Nitrite NEGATIVE NEGATIVE   Leukocytes,Ua NEGATIVE NEGATIVE  Wet prep, genital     Status: None   Collection Time: 12/11/18  1:23 AM  Result Value Ref Range   Yeast Wet Prep HPF POC NONE SEEN NONE SEEN   Trich, Wet Prep NONE SEEN NONE SEEN   Clue Cells Wet Prep HPF POC NONE SEEN NONE SEEN   WBC, Wet Prep HPF POC NONE SEEN NONE SEEN   Sperm NONE SEEN   Glucose, capillary     Status: None   Collection Time: 12/11/18  1:30 AM  Result Value Ref Range   Glucose-Capillary 89 70 - 99 mg/dL   US Ob Less Than 14 Weeks With Ob Transvaginal  Result Date: 12/11/2018 CLINICAL DATA:  23 year old pregnant female with spotting and pelvic pain. LMP: 09/28/2018 corresponding to an estimated gestational age of [redacted] weeks, 4 days. EXAM: OBSTETRIC <14 WK [place] AND TRANSVAGINAL OB US TECHNIQUE: Both transabdominal and transvaginal ultrasound examinations were  performed for complete evaluation of the gestation as well as the maternal uterus, adnexal regions, and pelvic cul-de-sac. Transvaginal technique was performed to assess early pregnancy. COMPARISON:  None. FINDINGS: Intrauterine gestational sac: Single intrauterine gestational sac. Yolk sac:  Seen Embryo:  Present Cardiac Activity: Detected Heart Rate: 164 bpm CRL:  15 mm   7 w   5 d                  [place] EDC: 07/25/2019 Subchorionic hemorrhage:  None visualized. Maternal uterus/adnexae: The maternal ovaries are unremarkable. There is a corpus luteum in the left ovary. IMPRESSION: Single  live intrauterine pregnancy with an estimated gestational age of [redacted] weeks, 5 days. Electronically Signed   By: Elgie CollardArash  Radparvar M.D.   On: 12/11/2018 03:35    MDM Pelvic Exam; Wet Prep and GC/CT Labs: UA Ultrasound Assessment and Plan  23 year old G1P0 10.4 weeks by Definite LMP Dizziness   -Exam findings discussed.   -Reviewed POC to include pelvic exam and ultrasound to confirm anatomical location. -Educated on proper nutrition during pregnancy to include frequent meals and hydration throughout the day. -Discussed usage of antiemetic for nausea and patient declines currently stating she is not nauseous, but is agreeable to script for home usage. -Cultures collected and pending. -Obtain CBG -Will send for US and await results.   Cherre RobinsJessica L Jaylaa Gallion MSN, CNM 12/11/2018, 1:05 AM   Reassessment (3:52 AM) SIUP at 7.5 weeks Normal CBG Wet Prep Negative  -US and lab results discussed.  -Patient expresses concern about drinking recently before discovering pregnancy and questions if infant will be born with "issues." -Discussed how provider unable to give reassurance on fetal outcomes, because it is unknown.  However, informed that most infants due well despite minimal maternal ETOH intake in 1st trimester.  Encouraged to discuss genetic testing options with primary OB once care established. -Offered and accepts  prenatal vitamins. -Will send prescription for phenergan 12.5 to pharmacy on file. -No other questions or concerns. -Encouraged to call or return to MAU if symptoms worsen or with the onset of new symptoms. -Discharged to home in stable condition.  Cherre RobinsJessica L Couper Juncaj MSN, CNM 12/11/2018 4:24 AM

## 2018-12-12 LAB — GC/CHLAMYDIA PROBE AMP (~~LOC~~) NOT AT ARMC
Chlamydia: NEGATIVE
Neisseria Gonorrhea: NEGATIVE

## 2018-12-16 ENCOUNTER — Ambulatory Visit: Payer: Managed Care, Other (non HMO) | Admitting: Registered Nurse

## 2018-12-16 ENCOUNTER — Encounter: Payer: Self-pay | Admitting: Registered Nurse

## 2018-12-16 ENCOUNTER — Other Ambulatory Visit: Payer: Self-pay

## 2018-12-16 VITALS — BP 101/68 | HR 85 | Temp 98.0°F | Resp 16 | Wt 147.0 lb

## 2018-12-16 DIAGNOSIS — R42 Dizziness and giddiness: Secondary | ICD-10-CM | POA: Diagnosis not present

## 2018-12-16 DIAGNOSIS — R110 Nausea: Secondary | ICD-10-CM

## 2018-12-16 DIAGNOSIS — Z3490 Encounter for supervision of normal pregnancy, unspecified, unspecified trimester: Secondary | ICD-10-CM

## 2018-12-16 NOTE — Progress Notes (Signed)
Established Patient Office Visit  Subjective:  Patient ID: Terri Combs, female    DOB: 12-08-1995  Age: 23 y.o. MRN: [id number]  CC:  Chief Complaint  Patient presents with   Hospitalization Follow-up    pregancy options     HPI Terri SlimSan Audi presents for pregnancy counseling.  She reports that she was seen in the ED for some cramping and spotting. She was given an ultrasound that showed a viable pregnancy in the uterus and given a clean bill of health.  She has some ongoing nausea, dizziness, and cramping. I explained that given normal labs, these are likely normal - she should focus on a well rounded diet and through hydration while awaiting her visit to establish with an OB  She also reports that her partner has been waxing and waning in his support. She states that she continues to feel safe and comfortable in her relationship with him, but that he has voiced his opinion that terminating this pregnancy may be their best option. This seems to be largely centered around his anxiety, especially that she consumed alcohol on 3-4 occasions before finding out she was pregnant. She states that he has stated "If the baby comes out messed up, I'm going to blame it on you", and other concerning statements.   We discussed in depth limits of a safe relationship, bodily autonomy, reproductive coercion, termination options, and what to expect from any outcome. I answered her questions to the best of my ability - though she understands that an OB would be the best resource. She will continue to consider her options and reach out should she have any questions.  Past Medical History:  Diagnosis Date   Migraine with aura    Scoliosis    Vaginal yeast infection     Past Surgical History:  Procedure Laterality Date   NO PAST SURGERIES      Family History  Problem Relation Age of Onset   Migraines Mother     Social History   Socioeconomic History   Marital status: Single    Spouse name: Not on  file   Number of children: 0   Years of education: 12   Highest education level: Not on file  Occupational History   Occupation: Stage manager[employer]  Social Needs   Financial resource strain: Not on file   Food insecurity    Worry: Never true    Inability: Never true   Transportation needs    Medical: No    Non-medical: No  Tobacco Use   Smoking status: Never Smoker   Smokeless tobacco: Never Used  Substance and Sexual Activity   Alcohol use: No   Drug use: No   Sexual activity: Yes    Partners: Male    Birth control/protection: None  Lifestyle   Physical activity    Days per week: Not on file    Minutes per session: Not on file   Stress: Not on file  Relationships   Social connections    Talks on phone: Not on file    Gets together: Not on file    Attends religious service: Not on file    Active member of club or organization: Not on file    Attends meetings of clubs or organizations: Not on file    Relationship status: Not on file   Intimate partner violence    Fear of current or ex partner: Not on file    Emotionally abused: Not on file    Physically abused: Not  on file    Forced sexual activity: Not on file  Other Topics Concern   Not on file  Social History Narrative   Lives at home w/ her parents   Right-handed   Caffeine: soda daily    Outpatient Medications Prior to Visit  Medication Sig Dispense Refill   Prenatal Vit-Fe Fumarate-FA (PRENATAL VITAMIN PLUS LOW IRON) 27-1 MG TABS      promethazine (PHENERGAN) 12.5 MG tablet Take 1 tablet (12.5 mg total) by mouth every 6 (six) hours as needed for nausea or vomiting. 30 tablet 0   No facility-administered medications prior to visit.     No Known Allergies  ROS Review of Systems  All other systems reviewed and are negative.     Objective:    Physical Exam  BP 101/68    Pulse 85    Temp 98 F (36.7 C) (Oral)    Resp 16    Wt 147 lb (66.7 kg)    LMP 09/28/2018 Comment: per patient  irregular periods   SpO2 98%    BMI 27.05 kg/m  Wt Readings from Last 3 Encounters:  12/16/18 147 lb (66.7 kg)  12/11/18 146 lb 7 oz (66.4 kg)  12/08/18 147 lb (66.7 kg)     Health Maintenance Due  Topic Date Due   INFLUENZA VACCINE  11/15/2018    There are no preventive care reminders to display for this patient.  Lab Results  Component Value Date   TSH 0.707 04/24/2018   Lab Results  Component Value Date   WBC 11.6 (H) 12/08/2018   HGB 13.3 12/08/2018   HCT 41.4 12/08/2018   MCV 80 12/08/2018   PLT 279 12/08/2018   Lab Results  Component Value Date   NA 136 12/08/2018   K 4.3 12/08/2018   CO2 20 12/08/2018   GLUCOSE 84 12/08/2018   BUN 7 12/08/2018   CREATININE 0.87 12/08/2018   BILITOT 0.4 12/08/2018   ALKPHOS 63 12/08/2018   AST 14 12/08/2018   ALT 11 12/08/2018   PROT 7.0 12/08/2018   ALBUMIN 4.2 12/08/2018   CALCIUM 9.1 12/08/2018   ANIONGAP 11 05/04/2017   Lab Results  Component Value Date   CHOL 139 05/05/2016   Lab Results  Component Value Date   HDL 59 05/05/2016   Lab Results  Component Value Date   LDLCALC 68 05/05/2016   Lab Results  Component Value Date   TRIG 60 05/05/2016   Lab Results  Component Value Date   CHOLHDL 2.4 05/05/2016   Lab Results  Component Value Date   HGBA1C 5.3 12/08/2018      Assessment & Plan:   Problem List Items Addressed This Visit    None      No orders of the defined types were placed in this encounter.   Follow-up: No follow-ups on file.   PLAN  We spent around 40 minutes in discussion regarding her options, relationship, symptoms, what to expect, and more regarding her early pregnancy.   I spent 40 minutes with this patient, more than 50% of which was spent counseling/educating.   Patient encouraged to call clinic with any questions, comments, or concerns.    Maximiano Coss, NP

## 2018-12-16 NOTE — Patient Instructions (Signed)
° ° ° °  If you have lab work done today you will be contacted with your lab results within the next 2 weeks.  If you have not heard from us then please contact us. The fastest way to get your results is to register for My Chart. ° ° °IF you received an x-ray today, you will receive an invoice from [facility]. Please contact [facility] at [phone] with questions or concerns regarding your invoice.  ° °IF you received labwork today, you will receive an invoice from [facility]. Please contact [facility] at [phone] with questions or concerns regarding your invoice.  ° °Our billing staff will not be able to assist you with questions regarding bills from these companies. ° °You will be contacted with the lab results as soon as they are available. The fastest way to get your results is to activate your My Chart account. Instructions are located on the last page of this paperwork. If you have not heard from us regarding the results in 2 weeks, please contact this office. °  ° ° ° °

## 2019-01-01 ENCOUNTER — Ambulatory Visit (INDEPENDENT_AMBULATORY_CARE_PROVIDER_SITE_OTHER): Payer: Managed Care, Other (non HMO) | Admitting: *Deleted

## 2019-01-01 ENCOUNTER — Other Ambulatory Visit: Payer: Self-pay

## 2019-01-01 DIAGNOSIS — Z3490 Encounter for supervision of normal pregnancy, unspecified, unspecified trimester: Secondary | ICD-10-CM

## 2019-01-01 DIAGNOSIS — M419 Scoliosis, unspecified: Secondary | ICD-10-CM | POA: Insufficient documentation

## 2019-01-01 DIAGNOSIS — O0990 Supervision of high risk pregnancy, unspecified, unspecified trimester: Secondary | ICD-10-CM | POA: Insufficient documentation

## 2019-01-01 HISTORY — DX: Supervision of high risk pregnancy, unspecified, unspecified trimester: O09.90

## 2019-01-01 MED ORDER — BLOOD PRESSURE KIT DEVI: 1.0000 | 0 refills | Status: DC | PRN

## 2019-01-01 NOTE — Progress Notes (Signed)
I connected with  Addylin Viens on 01/01/19 at  1:30 PM EDT by telephone and verified that I am speaking with the correct person using two identifiers.   I discussed the limitations, risks, security and privacy concerns of performing an evaluation and management service by telephone and the availability of in person appointments. I also discussed with the patient that there may be a patient responsible charge related to this service. The patient expressed understanding and agreed to proceed.  Explained I am completing her New OB Intake today. We discussed Her EDD and that it is based on  [place] becauses she states her LMP was estimate because she has irregular periods.  I reviewed her allergies, meds, OB History, Medical /Surgical history, and appropriate screenings. I explained we will send her [employer] app- app sent to her while on phone. She verified she does have access to a blood pressure cuff. I asked her to bring the blood pressure cuff with her to her first ob appointment so we can show her how to use it. Explained  then we will have her take her blood pressure weekly and enter into the app. Explained she will have some visits in office and some virtually. She already has Community education officer. Reviewed appointment date/ time with her , our location and to wear mask, no visitors. Explained she will have exam, ob bloodwork, hemoglobin a1C, cbg , genetic testing if desired, pap if needed. Explained we will schedule an [place] at 19 weeks and she will get notification via [place]. She voices understanding.  Seaton Hofmann,RN 01/01/2019  1:31 PM

## 2019-01-07 ENCOUNTER — Telehealth: Payer: Self-pay | Admitting: Obstetrics and Gynecology

## 2019-01-07 NOTE — Telephone Encounter (Signed)
Attempted to call patient about her appointment on 9/24 @ 8:35. No answer left voicemail instructing patient to wear a face mask for the entire appointment and no visitors are allowed during the visit. Patient instructed not to attend the appointment if she was any symptoms. Symptom list and office number left.

## 2019-01-08 ENCOUNTER — Encounter: Payer: Self-pay | Admitting: Obstetrics and Gynecology

## 2019-01-08 ENCOUNTER — Other Ambulatory Visit: Payer: Self-pay

## 2019-01-08 ENCOUNTER — Ambulatory Visit (INDEPENDENT_AMBULATORY_CARE_PROVIDER_SITE_OTHER): Payer: Managed Care, Other (non HMO) | Admitting: Obstetrics and Gynecology

## 2019-01-08 ENCOUNTER — Other Ambulatory Visit (HOSPITAL_COMMUNITY)
Admission: RE | Admit: 2019-01-08 | Discharge: 2019-01-08 | Disposition: A | Discharge status: Home / Self Care | Payer: Managed Care, Other (non HMO) | Source: Ambulatory Visit | Attending: Obstetrics and Gynecology | Admitting: Obstetrics and Gynecology

## 2019-01-08 DIAGNOSIS — Z3491 Encounter for supervision of normal pregnancy, unspecified, first trimester: Secondary | ICD-10-CM | POA: Insufficient documentation

## 2019-01-08 DIAGNOSIS — Z3A11 11 weeks gestation of pregnancy: Secondary | ICD-10-CM

## 2019-01-08 LAB — OB RESULTS CONSOLE GBS: GBS: POSITIVE

## 2019-01-08 MED ORDER — DOXYLAMINE-PYRIDOXINE 10-10 MG PO TBEC: 2.0000 | DELAYED_RELEASE_TABLET | Freq: Every day | ORAL | 1 refills | Status: DC

## 2019-01-08 NOTE — Progress Notes (Signed)
Pt was to bring BP cuff today but forgot-01/08/2019/ Pt states has been spotting here and there.

## 2019-01-08 NOTE — Progress Notes (Signed)
History:   Terri Combs is a 23 y.o. G1P0000 at 5w5dby early ultrasound being seen today for her first obstetrical visit.  Her obstetrical history is significant for Scoliosis, migraines. Patient does intend to breast feed. Pregnancy history fully reviewed.  Patient reports no complaints.  HISTORY: OB History  Gravida Para Term Preterm AB Living  1 0 0 0 0 0  SAB TAB Ectopic Multiple Live Births  0 0 0 0 0    # Outcome Date GA Lbr Len/2nd Weight Sex Delivery Anes PTL Lv  1 Current             Last pap smear was done 2018 and was normal  Past Medical History:  Diagnosis Date  . Migraine with aura   . Scoliosis   . Vaginal yeast infection    Past Surgical History:  Procedure Laterality Date  . NO PAST SURGERIES     Family History  Problem Relation Age of Onset  . Migraines Mother   . Diabetes Mother   . Arthritis Mother   . Hypotension Father    Social History   Tobacco Use  . Smoking status: Never Smoker  . Smokeless tobacco: Never Used  Substance Use Topics  . Alcohol use: No  . Drug use: No   No Known Allergies Current Outpatient Medications on File Prior to Visit  Medication Sig Dispense Refill  . Blood Pressure Monitoring (BLOOD PRESSURE KIT) DEVI 1 Device by Does not apply route as needed. ICD 10:  O09.90 1 Device 0  . Prenatal MV-Min-FA-Omega-3 (PRENATAL GUMMIES/DHA & FA PO) Take 2 tablets by mouth daily.    . promethazine (PHENERGAN) 12.5 MG tablet Take 1 tablet (12.5 mg total) by mouth every 6 (six) hours as needed for nausea or vomiting. 30 tablet 0  . Prenatal Vit-Fe Fumarate-FA (PRENATAL VITAMIN PLUS LOW IRON) 27-1 MG TABS      No current facility-administered medications on file prior to visit.     Review of Systems Pertinent items noted in HPI and remainder of comprehensive ROS otherwise negative. Physical Exam:   Vitals:   01/08/19 0858  BP: 128/75  Pulse: 79  Temp: 98.8 F (37.1 C)  Weight: 144 lb 1.6 oz (65.4 kg)   Fetal Heart Rate  (bpm): 168 System: General: well-developed, well-nourished female in no acute distress   Skin: normal coloration and turgor, no rashes   Neurologic: oriented, normal, negative, normal mood   Extremities: normal strength, tone, and muscle mass, ROM of all joints is normal   HEENT PERRLA, extraocular movement intact and sclera clear, anicteric   Mouth/Teeth mucous membranes moist, pharynx normal without lesions and dental hygiene good   Neck supple and no masses   Cardiovascular: regular rate and rhythm   Respiratory:  no respiratory distress, normal breath sounds   Abdomen: soft, non-tender; bowel sounds normal; no masses,  no organomegaly     Assessment:   Pregnancy: G1P0000 Patient Active Problem List   Diagnosis Date Noted  . Supervision of low-risk pregnancy 01/01/2019  . Scoliosis   . Pregnancy at early stage 12/16/2018  . Pregnant 12/08/2018     Plan:   1. Encounter for supervision of low-risk pregnancy in first trimester - POCT Glucose (CBG) - Antibody screen; Future - Genetic Screening - Culture, OB Urine - Obstetric Panel, Including HIV - AFP, Serum, Open Spina Bifida; Future   Initial labs drawn. Continue prenatal vitamins. Genetic Screening discussed, NIPS: ordered. Ultrasound discussed; fetal anatomic survey: ordered. Problem list  reviewed and updated. The nature of [place] with multiple MDs and other Advanced Practice Providers was explained to patient; also emphasized that residents, students are part of our team. Routine obstetric precautions reviewed. Return for Return tomorrow for blood work only, 4 weeks for virtual visit. Marland Kitchen     , Artist Pais, [place] for [facility], [place]

## 2019-01-08 NOTE — Patient Instructions (Signed)
First Trimester of Pregnancy ° °The first trimester of pregnancy is from week 1 until the end of week 13 (months 1 through 3). During this time, your baby will begin to develop inside you. At 6-8 weeks, the eyes and face are formed, and the heartbeat can be seen on ultrasound. At the end of 12 weeks, all the baby's organs are formed. Prenatal care is all the medical care you receive before the birth of your baby. Make sure you get good prenatal care and follow all of your doctor's instructions. °Follow these instructions at home: °Medicines °· Take over-the-counter and prescription medicines only as told by your doctor. Some medicines are safe and some medicines are not safe during pregnancy. °· Take a prenatal vitamin that contains at least 600 micrograms (mcg) of folic acid. °· If you have trouble pooping (constipation), take medicine that will make your stool soft (stool softener) if your doctor approves. °Eating and drinking ° °· Eat regular, healthy meals. °· Your doctor will tell you the amount of weight gain that is right for you. °· Avoid raw meat and uncooked cheese. °· If you feel sick to your stomach (nauseous) or throw up (vomit): °? Eat 4 or 5 small meals a day instead of 3 large meals. °? Try eating a few soda crackers. °? Drink liquids between meals instead of during meals. °· To prevent constipation: °? Eat foods that are high in fiber, like fresh fruits and vegetables, whole grains, and beans. °? Drink enough fluids to keep your pee (urine) clear or pale yellow. °Activity °· Exercise only as told by your doctor. Stop exercising if you have cramps or pain in your lower belly (abdomen) or low back. °· Do not exercise if it is too hot, too humid, or if you are in a place of great height (high altitude). °· Try to avoid standing for long periods of time. Move your legs often if you must stand in one place for a long time. °· Avoid heavy lifting. °· Wear low-heeled shoes. Sit and stand up  straight. °· You can have sex unless your doctor tells you not to. °Relieving pain and discomfort °· Wear a good support bra if your breasts are sore. °· Take warm water baths (sitz baths) to soothe pain or discomfort caused by hemorrhoids. Use hemorrhoid cream if your doctor says it is okay. °· Rest with your legs raised if you have leg cramps or low back pain. °· If you have puffy, bulging veins (varicose veins) in your legs: °? Wear support hose or compression stockings as told by your doctor. °? Raise (elevate) your feet for 15 minutes, 3-4 times a day. °? Limit salt in your food. °Prenatal care °· Schedule your prenatal visits by the twelfth week of pregnancy. °· Write down your questions. Take them to your prenatal visits. °· Keep all your prenatal visits as told by your doctor. This is important. °Safety °· Wear your seat belt at all times when driving. °· Make a list of emergency phone numbers. The list should include numbers for family, friends, the hospital, and police and fire departments. °General instructions °· Ask your doctor for a referral to a local prenatal class. Begin classes no later than at the start of month 6 of your pregnancy. °· Ask for help if you need counseling or if you need help with nutrition. Your doctor can give you advice or tell you where to go for help. °· Do not use hot tubs, steam   rooms, or saunas. °· Do not douche or use tampons or scented sanitary pads. °· Do not cross your legs for long periods of time. °· Avoid all herbs and alcohol. Avoid drugs that are not approved by your doctor. °· Do not use any tobacco products, including cigarettes, chewing tobacco, and electronic cigarettes. If you need help quitting, ask your doctor. You may get counseling or other support to help you quit. °· Avoid cat litter boxes and soil used by cats. These carry germs that can cause birth defects in the baby and can cause a loss of your baby (miscarriage) or stillbirth. °· Visit your dentist.  At home, brush your teeth with a soft toothbrush. Be gentle when you floss. °Contact a doctor if: °· You are dizzy. °· You have mild cramps or pressure in your lower belly. °· You have a nagging pain in your belly area. °· You continue to feel sick to your stomach, you throw up, or you have watery poop (diarrhea). °· You have a bad smelling fluid coming from your vagina. °· You have pain when you pee (urinate). °· You have increased puffiness (swelling) in your face, hands, legs, or ankles. °Get help right away if: °· You have a fever. °· You are leaking fluid from your vagina. °· You have spotting or bleeding from your vagina. °· You have very bad belly cramping or pain. °· You gain or lose weight rapidly. °· You throw up blood. It may look like coffee grounds. °· You are around people who have German measles, fifth disease, or chickenpox. °· You have a very bad headache. °· You have shortness of breath. °· You have any kind of trauma, such as from a fall or a car accident. °Summary °· The first trimester of pregnancy is from week 1 until the end of week 13 (months 1 through 3). °· To take care of yourself and your unborn baby, you will need to eat healthy meals, take medicines only if your doctor tells you to do so, and do activities that are safe for you and your baby. °· Keep all follow-up visits as told by your doctor. This is important as your doctor will have to ensure that your baby is healthy and growing well. °This information is not intended to replace advice given to you by your health care provider. Make sure you discuss any questions you have with your health care provider. °Document Released: 09/19/2007 Document Revised: 07/24/2018 Document Reviewed: 04/10/2016 °Elsevier Patient Education © 2020 Elsevier Inc. ° °

## 2019-01-09 LAB — OBSTETRIC PANEL, INCLUDING HIV
Antibody Screen: NEGATIVE
Basophils Absolute: 0 10*3/uL (ref 0.0–0.2)
Basos: 0 %
EOS (ABSOLUTE): 0.1 10*3/uL (ref 0.0–0.4)
Eos: 1 %
HIV Screen 4th Generation wRfx: NONREACTIVE
Hematocrit: 40.7 % (ref 34.0–46.6)
Hemoglobin: 12.9 g/dL (ref 11.1–15.9)
Hepatitis B Surface Ag: NEGATIVE
Immature Grans (Abs): 0.1 10*3/uL (ref 0.0–0.1)
Immature Granulocytes: 1 %
Lymphocytes Absolute: 1.7 10*3/uL (ref 0.7–3.1)
Lymphs: 16 %
MCH: 26.1 pg — ABNORMAL LOW (ref 26.6–33.0)
MCHC: 31.7 g/dL (ref 31.5–35.7)
MCV: 82 fL (ref 79–97)
Monocytes Absolute: 0.8 10*3/uL (ref 0.1–0.9)
Monocytes: 7 %
Neutrophils Absolute: 8.3 10*3/uL — ABNORMAL HIGH (ref 1.4–7.0)
Neutrophils: 75 %
Platelets: 292 10*3/uL (ref 150–450)
RBC: 4.95 x10E6/uL (ref 3.77–5.28)
RDW: 13.3 % (ref 11.7–15.4)
RPR Ser Ql: NONREACTIVE
Rh Factor: POSITIVE
Rubella Antibodies, IGG: 1.03 index (ref >0.99)
WBC: 10.9 10*3/uL — ABNORMAL HIGH (ref 3.4–10.8)

## 2019-01-09 LAB — CERVICOVAGINAL ANCILLARY ONLY
Bacterial Vaginitis (gardnerella): NEGATIVE
Candida Glabrata: NEGATIVE
Candida Vaginitis: POSITIVE — AB
Molecular Disclaimer: NEGATIVE
Molecular Disclaimer: NEGATIVE
Molecular Disclaimer: NEGATIVE
Molecular Disclaimer: NORMAL
Trichomonas: NEGATIVE

## 2019-01-12 ENCOUNTER — Encounter: Payer: Self-pay | Admitting: Obstetrics and Gynecology

## 2019-01-12 DIAGNOSIS — O2340 Unspecified infection of urinary tract in pregnancy, unspecified trimester: Secondary | ICD-10-CM | POA: Insufficient documentation

## 2019-01-12 DIAGNOSIS — B951 Streptococcus, group B, as the cause of diseases classified elsewhere: Secondary | ICD-10-CM | POA: Insufficient documentation

## 2019-01-12 HISTORY — DX: Streptococcus, group b, as the cause of diseases classified elsewhere: B95.1

## 2019-01-12 LAB — URINE CULTURE, OB REFLEX

## 2019-01-12 LAB — CULTURE, OB URINE

## 2019-01-14 ENCOUNTER — Other Ambulatory Visit: Payer: Self-pay | Admitting: Obstetrics and Gynecology

## 2019-01-14 MED ORDER — CEPHALEXIN 500 MG PO CAPS: 500.0000 mg | ORAL_CAPSULE | Freq: Four times a day (QID) | ORAL | 0 refills | Status: DC

## 2019-01-14 NOTE — Progress Notes (Signed)
+   GBS in urine Rx: Keflex  Message left on VM that I will attempt to call patient to discuss at a later time.  Lezlie Lye, NP 01/14/2019 10:44 AM

## 2019-01-15 ENCOUNTER — Other Ambulatory Visit: Payer: Self-pay

## 2019-01-15 ENCOUNTER — Telehealth: Payer: Self-pay

## 2019-01-15 DIAGNOSIS — B379 Candidiasis, unspecified: Secondary | ICD-10-CM

## 2019-01-15 MED ORDER — TERCONAZOLE 0.4 % VA CREA: 1.0000 | TOPICAL_CREAM | Freq: Every day | VAGINAL | 0 refills | Status: DC

## 2019-01-15 NOTE — Telephone Encounter (Signed)
Pt left VM on nurse line stating she missed a call from Noni Saupe, NP about test results.   Per chart review, urine + for GBS. Keflex rx sent to pt's pharmacy.   VM left for pt to notify her we received her message, that we were initially calling to notify her she has a UTI, and she has a rx to pick up at her pharmacy.   Will send MyChart message.

## 2019-01-20 ENCOUNTER — Other Ambulatory Visit: Payer: Self-pay

## 2019-01-20 DIAGNOSIS — Z3490 Encounter for supervision of normal pregnancy, unspecified, unspecified trimester: Secondary | ICD-10-CM

## 2019-01-20 MED ORDER — CEPHALEXIN 250 MG/5ML PO SUSR: 500.0000 mg | Freq: Four times a day (QID) | ORAL | 0 refills | Status: AC

## 2019-01-22 ENCOUNTER — Encounter: Payer: Self-pay | Admitting: *Deleted

## 2019-01-29 ENCOUNTER — Telehealth (INDEPENDENT_AMBULATORY_CARE_PROVIDER_SITE_OTHER): Payer: Medicaid Other

## 2019-01-29 DIAGNOSIS — Z3491 Encounter for supervision of normal pregnancy, unspecified, first trimester: Secondary | ICD-10-CM

## 2019-01-29 NOTE — Telephone Encounter (Signed)
Natera results sent to office. Called pt to notify her she is a silent carrier for alpha-thalassemia. Pt given phone number to schedule genetic counseling appt with [name] Cancel for more information. Pt requests fetal sex; instructed pt she can view the sex under media in [place]. Pt verbalizes understanding.

## 2019-02-02 ENCOUNTER — Encounter: Payer: Self-pay | Admitting: *Deleted

## 2019-02-05 ENCOUNTER — Telehealth (INDEPENDENT_AMBULATORY_CARE_PROVIDER_SITE_OTHER): Payer: Medicaid Other | Admitting: Obstetrics & Gynecology

## 2019-02-05 ENCOUNTER — Encounter: Payer: Self-pay | Admitting: Obstetrics & Gynecology

## 2019-02-05 ENCOUNTER — Other Ambulatory Visit: Payer: Self-pay

## 2019-02-05 VITALS — BP 107/58 | HR 79

## 2019-02-05 DIAGNOSIS — O98812 Other maternal infectious and parasitic diseases complicating pregnancy, second trimester: Secondary | ICD-10-CM

## 2019-02-05 DIAGNOSIS — M4100 Infantile idiopathic scoliosis, site unspecified: Secondary | ICD-10-CM

## 2019-02-05 DIAGNOSIS — Z3A15 15 weeks gestation of pregnancy: Secondary | ICD-10-CM

## 2019-02-05 DIAGNOSIS — O2342 Unspecified infection of urinary tract in pregnancy, second trimester: Secondary | ICD-10-CM

## 2019-02-05 DIAGNOSIS — B951 Streptococcus, group B, as the cause of diseases classified elsewhere: Secondary | ICD-10-CM

## 2019-02-05 DIAGNOSIS — Z3492 Encounter for supervision of normal pregnancy, unspecified, second trimester: Secondary | ICD-10-CM

## 2019-02-05 NOTE — Progress Notes (Signed)
   TELEHEALTH OBSTETRICS PRENATAL VIRTUAL VIDEO VISIT ENCOUNTER NOTE  Provider location: [facility] for [facility] at [place]   I connected with Terri Combs on 02/05/19 at  2:55 PM EDT by MyChart Video Encounter at home and verified that I am speaking with the correct person using two identifiers.   I discussed the limitations, risks, security and privacy concerns of performing an evaluation and management service virtually and the availability of in person appointments. I also discussed with the patient that there may be a patient responsible charge related to this service. The patient expressed understanding and agreed to proceed. Subjective:  Terri Combs is a 23 y.o. G1P0000 at [redacted]w[redacted]d being seen today for ongoing prenatal care.  She is currently monitored for the following issues for this low-risk pregnancy and has Supervision of low-risk pregnancy; Scoliosis; and GBS (group B streptococcus) UTI complicating pregnancy on their problem list.  Patient reports no complaints.  Contractions: Not present. Vag. Bleeding: None.  Movement: Absent. Denies any leaking of fluid.   The following portions of the patient's history were reviewed and updated as appropriate: allergies, current medications, past family history, past medical history, past social history, past surgical history and problem list.   Objective:   Vitals:   02/05/19 1503  BP: (!) 107/58  Pulse: 79    Fetal Status:     Movement: Absent     General:  Alert, oriented and cooperative. Patient is in no acute distress.  Respiratory: Normal respiratory effort, no problems with respiration noted  Mental Status: Normal mood and affect. Normal behavior. Normal judgment and thought content.  Rest of physical exam deferred due to type of encounter  Imaging: No results found.  Assessment and Plan:  Pregnancy: G1P0000 at [redacted]w[redacted]d 1. Group B Streptococcus urinary tract infection affecting pregnancy in second trimester Needs atbx in labor.   2.  Encounter for supervision of low-risk pregnancy in second trimester  3. Infantile idiopathic scoliosis, unspecified spinal region  Preterm labor symptoms and general obstetric precautions including but not limited to vaginal bleeding, contractions, leaking of fluid and fetal movement were reviewed in detail with the patient. I discussed the assessment and treatment plan with the patient. The patient was provided an opportunity to ask questions and all were answered. The patient agreed with the plan and demonstrated an understanding of the instructions. The patient was advised to call back or seek an in-person office evaluation/go to [facility] at [facility] for any urgent or concerning symptoms. Please refer to After Visit Summary for other counseling recommendations.   I provided 9 minutes of face-to-face time during this encounter.  No follow-ups on file.  Future Appointments  Date Time Provider [place]  03/02/2019  2:30 PM WH-MFC [place] 1 WH-MFCUS MFC-US    Johna Kearl Harraway-Smith, [place] for [facility], [place]

## 2019-02-05 NOTE — Progress Notes (Signed)
I connected with  Terri Combs on 02/05/19 at  2:55 PM EDT by telephone and verified that I am speaking with the correct person using two identifiers.   I discussed the limitations, risks, security and privacy concerns of performing an evaluation and management service by telephone and the availability of in person appointments. I also discussed with the patient that there may be a patient responsible charge related to this service. The patient expressed understanding and agreed to proceed.  Pt reports she is currently using Terazol cream for a yeast infection; last day of treatment is Saturday. Also reporting pain to bilateral lower abdomen. Discussed possibility of round ligament pain in pregnancy.   [name], RN 02/05/2019  3:05 PM

## 2019-02-10 ENCOUNTER — Encounter: Payer: Self-pay | Admitting: *Deleted

## 2019-02-13 ENCOUNTER — Encounter: Payer: Self-pay | Admitting: Obstetrics and Gynecology

## 2019-02-13 DIAGNOSIS — D563 Thalassemia minor: Secondary | ICD-10-CM | POA: Insufficient documentation

## 2019-03-02 ENCOUNTER — Other Ambulatory Visit: Payer: Self-pay

## 2019-03-02 ENCOUNTER — Other Ambulatory Visit: Payer: Self-pay | Admitting: Obstetrics and Gynecology

## 2019-03-02 ENCOUNTER — Ambulatory Visit (HOSPITAL_COMMUNITY)
Admission: RE | Admit: 2019-03-02 | Discharge: 2019-03-02 | Disposition: A | Discharge status: Home / Self Care | Payer: Managed Care, Other (non HMO) | Source: Ambulatory Visit | Attending: Obstetrics and Gynecology | Admitting: Obstetrics and Gynecology

## 2019-03-02 DIAGNOSIS — Z3A19 19 weeks gestation of pregnancy: Secondary | ICD-10-CM | POA: Diagnosis not present

## 2019-03-02 DIAGNOSIS — Z3490 Encounter for supervision of normal pregnancy, unspecified, unspecified trimester: Secondary | ICD-10-CM | POA: Insufficient documentation

## 2019-03-02 DIAGNOSIS — Z3685 Encounter for antenatal screening for Streptococcus B: Secondary | ICD-10-CM

## 2019-03-03 ENCOUNTER — Other Ambulatory Visit (HOSPITAL_COMMUNITY): Payer: Self-pay | Admitting: *Deleted

## 2019-03-03 DIAGNOSIS — Z362 Encounter for other antenatal screening follow-up: Secondary | ICD-10-CM

## 2019-03-05 ENCOUNTER — Telehealth (INDEPENDENT_AMBULATORY_CARE_PROVIDER_SITE_OTHER): Payer: Managed Care, Other (non HMO) | Admitting: Obstetrics and Gynecology

## 2019-03-05 ENCOUNTER — Other Ambulatory Visit: Payer: Self-pay

## 2019-03-05 ENCOUNTER — Encounter: Payer: Self-pay | Admitting: Obstetrics and Gynecology

## 2019-03-05 DIAGNOSIS — D563 Thalassemia minor: Secondary | ICD-10-CM

## 2019-03-05 DIAGNOSIS — O99820 Streptococcus B carrier state complicating pregnancy: Secondary | ICD-10-CM | POA: Diagnosis not present

## 2019-03-05 DIAGNOSIS — O2342 Unspecified infection of urinary tract in pregnancy, second trimester: Secondary | ICD-10-CM | POA: Diagnosis not present

## 2019-03-05 DIAGNOSIS — M41129 Adolescent idiopathic scoliosis, site unspecified: Secondary | ICD-10-CM

## 2019-03-05 DIAGNOSIS — Z3A19 19 weeks gestation of pregnancy: Secondary | ICD-10-CM

## 2019-03-05 DIAGNOSIS — B951 Streptococcus, group B, as the cause of diseases classified elsewhere: Secondary | ICD-10-CM

## 2019-03-05 DIAGNOSIS — Z3492 Encounter for supervision of normal pregnancy, unspecified, second trimester: Secondary | ICD-10-CM

## 2019-03-05 NOTE — Progress Notes (Signed)
Pt is at work so does not have BP Cuff available, asked her to take when she gets home & log into [facility]. Pt states when she had sex last w/ boyfriend feels that something started to leak out. When she had her U/S was told that  Her fluid looked fine. Also she was tested positive for Yeast Infection in 09/20 and started Tx on 01/15/19 but still is having some irritation.

## 2019-03-05 NOTE — Progress Notes (Signed)
I connected with  Veleka Reasons on 03/05/19 at  2:15 PM EST by telephone and verified that I am speaking with the correct person using two identifiers.   I discussed the limitations, risks, security and privacy concerns of performing an evaluation and management service by telephone and the availability of in person appointments. I also discussed with the patient that there may be a patient responsible charge related to this service. The patient expressed understanding and agreed to proceed.  Bethanne Ginger, [place] 03/05/2019  2:25 PM

## 2019-03-05 NOTE — Progress Notes (Signed)
   TELEHEALTH VIRTUAL OBSTETRICS VISIT ENCOUNTER NOTE  I connected with Francie Kassabian on 03/05/19 at  2:15 PM EST by telephone at home and verified that I am speaking with the correct person using two identifiers.   I discussed the limitations, risks, security and privacy concerns of performing an evaluation and management service by telephone and the availability of in person appointments. I also discussed with the patient that there may be a patient responsible charge related to this service. The patient expressed understanding and agreed to proceed.  Subjective:  Terri Combs is a 23 y.o. G1P0000 at [redacted]w[redacted]d being followed for ongoing prenatal care.  She is currently monitored for the following issues for this low-risk pregnancy and has Supervision of low-risk pregnancy; Scoliosis; GBS (group B streptococcus) UTI complicating pregnancy; and Hemoglobin Constant Spring trait on their problem list.  Patient reports no complaints. Reports fetal movement. Denies any contractions, bleeding or leaking of fluid.   The following portions of the patient's history were reviewed and updated as appropriate: allergies, current medications, past family history, past medical history, past social history, past surgical history and problem list.   Objective:   General:  Alert, oriented and cooperative.   Mental Status: Normal mood and affect perceived. Normal judgment and thought content.  Rest of physical exam deferred due to type of encounter  Assessment and Plan:  Pregnancy: G1P0000 at [redacted]w[redacted]d 1. Encounter for supervision of low-risk pregnancy in second trimester Stable   2. Group B Streptococcus urinary tract infection affecting pregnancy in second trimester Tx while  3. 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 Adolescent idiopathic scoliosis, unspecified spinal region Stable   Preterm labor symptoms and general obstetric precautions including but not limited to vaginal bleeding,  contractions, leaking of fluid and fetal movement were reviewed in detail with the patient.  I discussed the assessment and treatment plan with the patient. The patient was provided an opportunity to ask questions and all were answered. The patient agreed with the plan and demonstrated an understanding of the instructions. The patient was advised to call back or seek an in-person office evaluation/go to [facility] at [facility] for any urgent or concerning symptoms. Please refer to After Visit Summary for other counseling recommendations.   I provided 8 minutes of non-face-to-face time during this encounter.  Return in about 4 weeks (around 04/02/2019) for OB visit, virtual.  Future Appointments  Date Time Provider [place]  03/30/2019  3:45 PM WH-MFC [place] 2 WH-MFCUS MFC-US    Michael L Ervin, [place] for [facility], [place]

## 2019-03-30 ENCOUNTER — Other Ambulatory Visit: Payer: Self-pay

## 2019-03-30 ENCOUNTER — Ambulatory Visit (HOSPITAL_COMMUNITY)
Admission: RE | Admit: 2019-03-30 | Discharge: 2019-03-30 | Disposition: A | Discharge status: Home / Self Care | Payer: Managed Care, Other (non HMO) | Source: Ambulatory Visit | Attending: Obstetrics and Gynecology | Admitting: Obstetrics and Gynecology

## 2019-03-30 DIAGNOSIS — Z3A23 23 weeks gestation of pregnancy: Secondary | ICD-10-CM

## 2019-03-30 DIAGNOSIS — Z362 Encounter for other antenatal screening follow-up: Secondary | ICD-10-CM | POA: Insufficient documentation

## 2019-03-30 DIAGNOSIS — O359XX0 Maternal care for (suspected) fetal abnormality and damage, unspecified, not applicable or unspecified: Secondary | ICD-10-CM | POA: Diagnosis not present

## 2019-04-01 ENCOUNTER — Encounter: Payer: Self-pay | Admitting: Obstetrics and Gynecology

## 2019-04-01 ENCOUNTER — Telehealth: Payer: Medicaid Other | Admitting: Obstetrics and Gynecology

## 2019-04-01 DIAGNOSIS — O2342 Unspecified infection of urinary tract in pregnancy, second trimester: Secondary | ICD-10-CM

## 2019-04-01 DIAGNOSIS — Z3492 Encounter for supervision of normal pregnancy, unspecified, second trimester: Secondary | ICD-10-CM

## 2019-04-01 DIAGNOSIS — B951 Streptococcus, group B, as the cause of diseases classified elsewhere: Secondary | ICD-10-CM

## 2019-04-01 NOTE — Progress Notes (Signed)
Called pt @ 0920 and LM that I am calling regards to her virtual appt if she could pelase all the office.  I will call back in 15 min in which if I do not reach her we have request that she reschedules.    Called pt @ 0940 and LM that this is our second attempt in trying to reach.  I will have to request that she reschedules her appt at [phone].   Mel Almond, RN 04/01/19

## 2019-04-01 NOTE — Progress Notes (Signed)
Patient not available for virtual visit. Will try to schedule in person for glucola in 3 weeks

## 2019-04-14 ENCOUNTER — Ambulatory Visit: Payer: Managed Care, Other (non HMO) | Attending: Internal Medicine

## 2019-04-14 DIAGNOSIS — Z20822 Contact with and (suspected) exposure to covid-19: Secondary | ICD-10-CM

## 2019-04-15 LAB — NOVEL CORONAVIRUS, NAA: SARS-CoV-2, NAA: NOT DETECTED

## 2019-04-17 NOTE — L&D Delivery Note (Signed)
OB/GYN Faculty Practice Delivery Note  Terri Combs is a 24 y.o. G1P0000 s/p NSVD at [redacted]w[redacted]d. She was admitted for PROM.   ROM: 13h 72m with clear fluid GBS Status: Positive  Maximum Maternal Temperature: 98.9  Labor Progress: . Patient admitted after PROM. Labor did not begin and patient started on pitocin.   Delivery Date/Time: 07/15/19 17:57 Delivery: Called to room and patient was complete and pushing. Head delivered LOA. No nuchal cord present. Shoulder and body delivered in usual fashion. Infant with spontaneous cry, placed on mother's abdomen, dried and stimulated. Cord clamped x 2 after 1-minute delay, and cut by FOB. Cord blood drawn. Placenta delivered spontaneously with gentle cord traction. Fundus firm with massage and Pitocin. Labia, perineum, vagina, and cervix inspected inspected with no laceration.   Began fundal massage and patient with a steady trickle. TXA given. Lower uterine segment explored and clots removed. Bleeding stable.   Placenta: complete/intact/spontaneous  Complications: none Lacerations: none EBL: 635 cc Analgesia: epidural   Postpartum Planning [ ]  message to sent to schedule follow-up    Infant: Female APGARs 8/9  pending  10/9 DNP, CNM  07/15/19  6:26 PM

## 2019-04-28 ENCOUNTER — Other Ambulatory Visit: Payer: Self-pay | Admitting: Lactation Services

## 2019-04-28 DIAGNOSIS — Z3492 Encounter for supervision of normal pregnancy, unspecified, second trimester: Secondary | ICD-10-CM

## 2019-04-29 ENCOUNTER — Other Ambulatory Visit (HOSPITAL_COMMUNITY)
Admission: RE | Admit: 2019-04-29 | Discharge: 2019-04-29 | Disposition: A | Discharge status: Home / Self Care | Payer: Managed Care, Other (non HMO) | Source: Ambulatory Visit | Attending: Medical | Admitting: Medical

## 2019-04-29 ENCOUNTER — Ambulatory Visit (INDEPENDENT_AMBULATORY_CARE_PROVIDER_SITE_OTHER): Payer: Managed Care, Other (non HMO) | Admitting: Certified Nurse Midwife

## 2019-04-29 ENCOUNTER — Other Ambulatory Visit: Payer: Self-pay

## 2019-04-29 ENCOUNTER — Other Ambulatory Visit: Payer: Medicaid Other

## 2019-04-29 ENCOUNTER — Encounter: Payer: Self-pay | Admitting: Certified Nurse Midwife

## 2019-04-29 VITALS — BP 122/73 | HR 103 | Wt 164.0 lb

## 2019-04-29 DIAGNOSIS — Z23 Encounter for immunization: Secondary | ICD-10-CM | POA: Diagnosis not present

## 2019-04-29 DIAGNOSIS — N898 Other specified noninflammatory disorders of vagina: Secondary | ICD-10-CM

## 2019-04-29 DIAGNOSIS — O26892 Other specified pregnancy related conditions, second trimester: Secondary | ICD-10-CM

## 2019-04-29 DIAGNOSIS — B3731 Acute candidiasis of vulva and vagina: Secondary | ICD-10-CM

## 2019-04-29 DIAGNOSIS — Z3A27 27 weeks gestation of pregnancy: Secondary | ICD-10-CM

## 2019-04-29 DIAGNOSIS — Z3490 Encounter for supervision of normal pregnancy, unspecified, unspecified trimester: Secondary | ICD-10-CM

## 2019-04-29 DIAGNOSIS — O26893 Other specified pregnancy related conditions, third trimester: Secondary | ICD-10-CM

## 2019-04-29 DIAGNOSIS — Z3492 Encounter for supervision of normal pregnancy, unspecified, second trimester: Secondary | ICD-10-CM

## 2019-04-29 DIAGNOSIS — B373 Candidiasis of vulva and vagina: Secondary | ICD-10-CM

## 2019-04-29 NOTE — Patient Instructions (Addendum)
AREA PEDIATRIC/FAMILY PRACTICE PHYSICIANS  [place] ([address]) . [facility] o Chambliss, MD; Eniola, MD; Hale, MD; Hensel, MD; McDiarmid, MD; McIntyer, MD; Neal, MD; Walden, MD o [address] [place], [place] [address] o [phone] o Mon-Fri 8:30-12:30, 1:30-5:00 o Providers come to see babies at [facility] o Accepting Medicaid . [facility] o Limited providers who accept newborns: Koirala, MD; Morrow, MD; Wolters, MD o [address], [place], [place] [address] o [phone] o Mon-Fri 8:00-5:30 o Babies seen by providers at [facility] o Does NOT accept Medicaid o Please call early in hospitalization for appointment (limited availability)  . [facility] o Mulberry, MD o [address] [place], [place] [address] o [phone] o Mon, Tue, Thur, Fri 8:30-5:00, Wed 10:00-7:00 (closed 1-2pm) o Babies seen by [facility] providers o Accepting Medicaid . [facility] - Pediatrician o Rubin, MD o [address], [place], [place] [address] o [phone] o Mon-Fri 8:30-5:00, Sat 8:30-12:00 o Provider comes to see babies at [facility] o Accepting Medicaid o Must have been referred from current patients or contacted office prior to delivery . [facility] ([facility] o Brown, MD; Chandler, MD; Ettefagh, MD; Grant, MD; Lester, MD; McCormick, MD; McQueen, MD; Prose, MD; Simha, MD; Stanley, MD; Stryffeler, NP; Tebben, NP o [address], [place], [place] [address] o [phone] o Mon, Tue, Thur, Fri 8:30-5:30, Wed 9:30-5:30, Sat 8:30-12:30 o Babies seen by [facility] providers o Accepting Medicaid o Only accepting infants of first-time parents or siblings of current patients o Hospital discharge coordinator will make follow-up appointment . Jack Amos o [address],  [place], [place]  [address] o [phone]   Fax - [phone] . [facility] o [address], [place], [place]  [address] o Phone - [phone]   Fax - [phone] . Shilpa Gosrani o [address], [place], [place]  [address] o [phone]  [place] ([address]) . [facility] o Bates, MD; [place], MD; Cooper, Cox, MD; MD; Davis, MD; Dovico, MD; Ettefaugh, MD; Little, MD; Lowe, MD; Keiffer, MD; Melvin, MD; Sumner, MD; Williams, MD o [address], [place], [place] [address] o [phone] o Mon-Fri 8:30-5:00 (extended evenings Mon-Thur as needed), Sat-Sun 10:00-1:00 o Providers come to see babies at [facility] o Accepting Medicaid for families of first-time babies and families with all children in the household age 3 and under. Must register with office prior to making appointment (M-F only). . [facility] o Henson, NP; Knapp, MD; Lalonde, MD; Tysinger, PA o [address] [place], [place] [address] o [phone] o Mon-Fri 8:00-5:00 o Babies seen by providers at [facility] o Does NOT accept Medicaid/Commercial Insurance Only . [facility] [place] [facility]  o Artis, MD; Barnes, MD; Bratton, MD; Coccaro, MD; Lockett Gardner, MD; Kramer, MD; Marshall, MD; Netherton, MD; Poleto, MD; Skinner, MD o [address] [place], [place] [address] o [phone] o Mon-Fri 8:30-5:30, Sat (Oct.-Mar.) 9:00-1:00 o Babies seen by providers at [facility] o Accepting Medicaid  [place] ([address]) . [facility] o Reid, MD; Warner, MD o [address], [place],  [address] o [phone] o Mon-Fri 8:30-5:00, Sat 8:30-12:00 o Providers come to see babies at [facility] o Does NOT accept Medicaid . [facility] o Becker, PA; Hagler, MD; Scifres, PA; Sun, MD; Swayne, MD o [address],  [place], [place] [address] o [phone] o Mon-Fri 8:00-5:00 o Babies seen by providers at [facility] o Does NOT accept Medicaid o Only accepting babies of parents who are patients o Please call early in hospitalization for appointment (limited availability) . [facility] o Clark, MD; Frye, MD; Kelleher, MD; Mack, NP; Miller, MD; O'Keller, MD; Patterson, NP; Pudlo, MD; Puzio, MD; Thomas, MD; Tucker, MD; Twiselton, MD o [address], [place], [place] [address] o [phone] o Mon-Fri 8:00-5:00, Sat 9:00-12:00 o Providers come to see babies at [facility] o Does NOT accept Medicaid  [place] ([address]) . [facility] o Limited providers accepting new patients: Brake, NP; Wharton, PA o [address], [place], [place] [address] o [phone] o Mon-Fri 8:00-5:00 o Babies seen by providers at [facility] o Does NOT accept Medicaid o Only accepting babies of parents who are patients o Please call early in hospitalization for appointment (limited availability) . [facility] o Gay, MD; Quinlan, MD o [address] [place], [place] [address] o [phone] (press [phone] to schedule appointment) o Mon-Fri 8:00-5:00 o Providers come to see babies at [facility] o Does NOT accept Medicaid . [facility] o Mazer, MD o [address] [place], [place] [address] o [phone] o Mon-Fri 8:30-5:00 (lunch 12:30-1:00), extended hours by appointment only Wed 5:00-6:30 o Babies seen by [facility] providers o Accepting Medicaid . [facility] o Banks, MD; Jordan, MD; Koberlein, MD o [address], [place], [place] [address] o [phone] o Mon-Fri 8:00-5:00 o Babies seen by [facility] providers o Does NOT accept Medicaid . [facility] o Parker, MD; Hunter, MD; Wallace, DO o [address] [place], [place]  [address] o [phone] o Mon-Fri 8:00-5:00 o Babies seen by [facility] providers o Does NOT accept Medicaid . [facility] o Brandon, PA; Brecken, PA; Christy, NP; Dees, MD; DeClaire, MD; DeWeese, MD; Hansen, NP; Mills, NP; Parrish, NP; Smoot, NP; Summer, MD; Vapne, MD o [address] [place], [place] [address] o [phone] o Mon-Fri 8:30-5:00, Sat 10:00-1:00 o Providers come to see babies at [facility] o Does NOT accept Medicaid o Free prenatal information session Tuesdays at 4:45pm . [facility] o Bouska, MD; Gordon, PA; Jeffery, PA; Weber, PA o [address] [place] [address] o [phone] o Mon-Fri 7:30-5:30 o Babies seen by [facility] providers . [facility] o [address], [place], [place], [place]  [address] o [phone]   Fax - [phone]  [place] ([phone]) . [facility] o Reese, MD o [address] [place], [place] [address] o [phone] o Mon-Thur 8:00-6:00 o Providers come to see babies at [facility] o Accepting Medicaid . [facility] o Anderson, NP; Badger, MD; Beal, PA; Spencer, PA o [address] [place],  [address] o [phone] o Mon-Thur 7:30-7:30, Fri 7:30-4:30 o Babies seen by [facility] providers o Accepting Medicaid . [facility] o Agbuya, MD; Klett, NP; Romgoolam, MD o [address], ,  [address] o [phone] o Mon-Fri 8:30-5:00, Sat 8:30-12:00 o Providers come to see babies at [facility] o Accepting Medicaid o Must have "Meet & Greet" appointment at office prior to delivery . [facility]  ([facility] ) o McCord,   MD; Juleen China, MD; Clydene Laming, [address] [address], [place], [place] [address] o [phone] o Mon-Wed 8:00-6:00, Thur-Fri 8:00-5:00, Sat 9:00-12:00 o Providers come to  see babies at [facility] o Does NOT accept Medicaid o Only accepting siblings of current patients . [facility] [place], [place], [place], [place]  [address] o [phone]   Fax [phone] . [facility] [address], [place], [place]  [address] o [phone]   Fax - Morton Gorman [phone] & [phone]) . Therapist, music at [place], DO; [place], [address] [place], [place] [address] o [phone] o Mon-Fri 7:00-5:00 o Babies seen by [facility] providers o Does NOT accept Medicaid . [place], MD; [place], [place]; [place], [place], [place], [place] [address] o [phone] o Mon-Fri 8:00-5:00 o Babies seen by [facility] providers o Accepting Medicaid . Lamont, MD; [place], [place]; [place], NP; [place], [place], [place], [place] [address] o [phone] o Mon-Fri 8:00-5:00 o Babies seen by providers at [facility] [place] [phone]) . [facility] [place], [place] o [address] [place], [place] [address] o [phone] o Mon-Fri 8:00-5:00 o Babies seen by [facility] providers o Does NOT accept Medicaid o Limited availability, please call early in hospitalization to schedule follow-up . [facility] Leilani Merl, PA; Maisie Fus, MD; [place], MD; [place], [place]; Jeannine Kitten, MD; [place], [place] [facility] [address], [place], [place] [address] o [phone] o Mon-Fri 8:30-5:00, Sat 9:00-12:00 o Babies seen by providers at [facility] o Accepting Medicaid o Please register online then schedule online or call office o [email or web address] . [place] [facility]  [place]) Kristian Covey, NP; Dwyane Dee, MD; Leonidas Romberg, PA o [address] [place], [place], [place] [address] o [phone] o Mon-Fri 8:00-5:00 o Babies seen by providers at [facility] o Accepting Medicaid . [place] [facility]) Dairl Ponder, MD; Rayvon Char, NP; Melina Modena, MD o [address] [place], [place], [place] [address] o [phone] o Mon-Fri 8:00-5:30, Sat&Sun by appointment (phones open at 8:30) o Babies seen by [facility] providers o Accepting Medicaid o Must be a first-time baby or sibling of current patient . [place], [address], [place], [place]  [address] o [phone]   Fax - [phone]  [place] [phone] & [phone]) . [place], [place]; [place], [place]; Benjamine Mola, MD; [place], [place]; Harrell Lark, MD o [address] [place], [place] [address] o [phone] o Mon-Thur 8:00-7:00, Fri 8:00-5:00, Sat 8:00-12:00, Sun 9:00-12:00 o Babies seen by [facility] providers o Accepting Medicaid . [facility] at [facility], MD; Ruthann Cancer, MD; [facility], MD o [address], [place], [place] [address] o [phone] o Mon-Thur 8:00-5:00 o Babies seen by providers at [facility] o Accepting Medicaid . [facility] at [place], MD; [place], MD; Amedeo Plenty, MD; Bobby Rumpf, MD; List, MD; Lavonia Drafts, MD; Ruthann Cancer, MD; Selinda Eon, MD; Audie Box, MD; Jim Like, MD; Christie Nottingham, MD; Hubbard Hartshorn, MD; Modena Nunnery, MD o [address] [place], [place]  [address] o [phone] o Mon-Fri 8:00-5:30, Sat (Oct.-Mar.) 9:00-1:00 o Babies seen by providers at [facility] o Accepting Medicaid o Must fill out new patient packet, available online at [email or web address] . [facility] [place] [facility] [facility]) Simone Curia, NP; Tiburcio Pea, NP; Tresa Endo, NP; Whitney Post, MD;  [place], [place]; Hennie Duos, MD; Wynne Dust, MD; Kavin Leech, NP o [address], [place], [place] [address] o [phone] o Mon-Thur 8:00-5:30, Fri 8:00-5:00 o Babies seen by providers at [facility] o Accepting [facility] [phone]) . [facility] o [place], [place]; [place], MD; Tanya Nones, MD; [place], [place] o [address] [place], [place] [address] o [phone] o Mon-Fri 8:00-5:00 o Babies seen by providers at [facility] o Accepting [facility] [phone]) . [facility] at [facility] o [place], DO; Lenise Arena, MD; [place], [place] o [address], [place], [place] [address] o [phone] o Mon-Fri 8:00-5:00 o Babies seen by providers at [facility] o Does NOT accept Medicaid o Limited appointment availability, please call early in hospitalization  . Nature conservation officer at [facility] o Whitten, DO; [place], MD o [address], [place], [place] [address] o [phone] o Mon-Fri 8:00-5:00 o Babies seen by [facility] providers o Does NOT accept Medicaid . [facility] Lorrine Kin, MD; Ninetta Lights, MD; [place], [place]; Humbird, MD o [address], [place], [place] [address] o [phone] o Mon-Fri 8:00-5:00 o After hours clinic [place] [place], [place] [address]) [phone] Mon-Fri 5:00-8:00, Sat 12:00-6:00, Sun 10:00-4:00 o Babies seen by [facility] providers o Accepting Medicaid . [facility] at [facility] o [address], [place], [place]  [address] o [phone]   Fax - [phone]  [place] [phone]) . Nature conservation officer at [facility], MD o [address] [place], [place], [place] [address] o [phone] o Mon-Fri 8:00-5:00 o Babies seen by [facility] providers o Does NOT accept Medicaid . [facility] [place] [facility] [place]) Tomi Likens, MD o [address] [place] [address] [place], [place]  [address] o [phone] o Mon-Thur 8:00-7:00, Fri 8:00-5:00, Sat 8:00-12:00 o Babies seen by providers at [facility] o Accepting Medicaid - but does not have vaccinations in office (must be received elsewhere) o Limited availability, please call early in hospitalization  [place] ([address]) . [facility]  o Wyvonne Lenz, MD o [address] [place] [address] o [phone]  Fax [phone]   Third Trimester of Pregnancy  The third trimester is from week 28 through week 40 (months 7 through 9). This trimester is when your unborn baby (fetus) is growing very fast. At the end of the ninth month, the unborn baby is about 20 inches in length. It weighs about 6-10 pounds. Follow these instructions at home: Medicines  Take over-the-counter and prescription medicines only as told by your doctor. Some medicines are safe and some medicines are not safe during pregnancy.  Take a prenatal vitamin that contains at least 600 micrograms (mcg) of folic acid.  If you have trouble pooping (constipation), take medicine that will make your stool soft (stool softener) if your doctor approves. Eating and drinking   Eat regular, healthy meals.  Avoid raw meat and uncooked cheese.  If you get low calcium from the food you eat, talk to your doctor about taking a daily calcium supplement.  Eat four or five small  meals rather than three large meals a day.  Avoid foods that are high in fat and sugars, such as fried and sweet foods.  To prevent constipation: ? Eat foods that are high in fiber, like fresh fruits and vegetables, whole grains, and beans. ? Drink enough fluids to keep your pee (urine) clear or pale yellow. Activity  Exercise only as told by your doctor. Stop exercising if you start to have cramps.  Avoid heavy lifting, wear low heels, and sit up straight.  Do not exercise if it is too hot, too humid, or if you are in a place of great height (high  altitude).  You may continue to have sex unless your doctor tells you not to. Relieving pain and discomfort  Wear a good support bra if your breasts are tender.  Take frequent breaks and rest with your legs raised if you have leg cramps or low back pain.  Take warm water baths (sitz baths) to soothe pain or discomfort caused by hemorrhoids. Use hemorrhoid cream if your doctor approves.  If you develop puffy, bulging veins (varicose veins) in your legs: ? Wear support hose or compression stockings as told by your doctor. ? Raise (elevate) your feet for 15 minutes, 3-4 times a day. ? Limit salt in your food. Safety  Wear your seat belt when driving.  Make a list of emergency phone numbers, including numbers for family, friends, the hospital, and police and fire departments. Preparing for your baby's arrival To prepare for the arrival of your baby:  Take prenatal classes.  Practice driving to the hospital.  Visit the hospital and tour the maternity area.  Talk to your work about taking leave once the baby comes.  Pack your hospital bag.  Prepare the baby's room.  Go to your doctor visits.  Buy a rear-facing car seat. Learn how to install it in your car. General instructions  Do not use hot tubs, steam rooms, or saunas.  Do not use any products that contain nicotine or tobacco, such as cigarettes and e-cigarettes. If you need help quitting, ask your doctor.  Do not drink alcohol.  Do not douche or use tampons or scented sanitary pads.  Do not cross your legs for long periods of time.  Do not travel for long distances unless you must. Only do so if your doctor says it is okay.  Visit your dentist if you have not gone during your pregnancy. Use a soft toothbrush to brush your teeth. Be gentle when you floss.  Avoid cat litter boxes and soil used by cats. These carry germs that can cause birth defects in the baby and can cause a loss of your baby (miscarriage) or  stillbirth.  Keep all your prenatal visits as told by your doctor. This is important. Contact a doctor if:  You are not sure if you are in labor or if your water has broken.  You are dizzy.  You have mild cramps or pressure in your lower belly.  You have a nagging pain in your belly area.  You continue to feel sick to your stomach, you throw up, or you have watery poop.  You have bad smelling fluid coming from your vagina.  You have pain when you pee. Get help right away if:  You have a fever.  You are leaking fluid from your vagina.  You are spotting or bleeding from your vagina.  You have severe belly cramps or pain.  You lose or gain weight quickly.  You have trouble catching your breath and have chest pain.  You notice sudden or extreme puffiness (swelling) of your face, hands, ankles, feet, or legs.  You have not felt the baby move in over an hour.  You have severe headaches that do not go away with medicine.  You have trouble seeing.  You are leaking, or you are having a gush of fluid, from your vagina before you are 37 weeks.  You have regular belly spasms (contractions) before you are 37 weeks. Summary  The third trimester is from week 28 through week 40 (months 7 through 9). This time is when your unborn baby is growing very fast.  Follow your doctor's advice about medicine, food, and activity.  Get ready for the arrival of your baby by taking prenatal classes, getting all the baby items ready, preparing the baby's room, and visiting your doctor to be checked.  Get help right away if you are bleeding from your vagina, or you have chest pain and trouble catching your breath, or if you have not felt your baby move in over an hour. This information is not intended to replace advice given to you by your health care provider. Make sure you discuss any questions you have with your health care provider. Document Revised: 07/24/2018 Document Reviewed:  05/08/2016 Elsevier Patient Education  2020 [employer].

## 2019-04-29 NOTE — Progress Notes (Addendum)
   PRENATAL VISIT NOTE  Subjective:  Terri Combs is a 24 y.o. G1P0000 at [redacted]w[redacted]d being seen today for ongoing prenatal care.  She is currently monitored for the following issues for this low-risk pregnancy and has Supervision of low-risk pregnancy; Scoliosis; GBS (group B streptococcus) UTI complicating pregnancy; and Hemoglobin [place] trait on their problem list.  Patient reports vaginal discharge.  Contractions: Not present. Vag. Bleeding: None.  Movement: Present. Denies leaking of fluid.   The following portions of the patient's history were reviewed and updated as appropriate: allergies, current medications, past family history, past medical history, past social history, past surgical history and problem list.   Objective:   Vitals:   04/29/19 0851  BP: 122/73  Pulse: (!) 103  Weight: 164 lb (74.4 kg)    Fetal Status: Fetal Heart Rate (bpm): 151   Movement: Present     General:  Alert, oriented and cooperative. Patient is in no acute distress.  Skin: Skin is warm and dry. No rash noted.   Cardiovascular: Normal heart rate noted  Respiratory: Normal respiratory effort, no problems with respiration noted  Abdomen: Soft, gravid, appropriate for gestational age.  Pain/Pressure: Present     Pelvic: Cervical exam deferred        Extremities: Normal range of motion.  Edema: None  Mental Status: Normal mood and affect. Normal behavior. Normal judgment and thought content.   Assessment and Plan:  Pregnancy: G1P0000 at [redacted]w[redacted]d 1. Encounter for supervision of low-risk pregnancy, antepartum - Patient doing well, reports occasional vaginal discharge - was recently treated for yeast infection  - Routine prenatal care  - Anticipatory guidance on upcoming appointments - GTT and third trimester labs obtained today  - Educated and discussed glucose screening and diagnoses of GDM if results are elevated - patient verbalizes understanding  - Peds list given to patient in AVS - Patient request  urine culture to make sure UTI has resolved  - Culture, OB Urine  2. Vaginal discharge during pregnancy in third trimester - Patient reports white thin discharge that is sometimes clumpy  - Denies itching or irritation  - Cervicovaginal ancillary only( [place])  Preterm labor symptoms and general obstetric precautions including but not limited to vaginal bleeding, contractions, leaking of fluid and fetal movement were reviewed in detail with the patient. Please refer to After Visit Summary for other counseling recommendations.   Return in about 4 weeks (around 05/27/2019) for mychart, ROB.  Sharyon Cable, CNM

## 2019-04-29 NOTE — Progress Notes (Signed)
Last pap 2018 per patient

## 2019-04-30 LAB — CBC
Hematocrit: 36.1 % (ref 34.0–46.6)
Hemoglobin: 11.6 g/dL (ref 11.1–15.9)
MCH: 25.9 pg — ABNORMAL LOW (ref 26.6–33.0)
MCHC: 32.1 g/dL (ref 31.5–35.7)
MCV: 81 fL (ref 79–97)
Platelets: 293 10*3/uL (ref 150–450)
RBC: 4.48 x10E6/uL (ref 3.77–5.28)
RDW: 12.3 % (ref 11.7–15.4)
WBC: 15.7 10*3/uL — ABNORMAL HIGH (ref 3.4–10.8)

## 2019-04-30 LAB — HIV ANTIBODY (ROUTINE TESTING W REFLEX): HIV Screen 4th Generation wRfx: NONREACTIVE

## 2019-04-30 LAB — GLUCOSE TOLERANCE, 2 HOURS W/ 1HR
Glucose, 1 hour: 214 mg/dL — ABNORMAL HIGH (ref 65–179)
Glucose, 2 hour: 187 mg/dL — ABNORMAL HIGH (ref 65–152)
Glucose, Fasting: 91 mg/dL (ref 65–91)

## 2019-04-30 LAB — CERVICOVAGINAL ANCILLARY ONLY
Bacterial Vaginitis (gardnerella): NEGATIVE
Candida Glabrata: NEGATIVE
Candida Vaginitis: POSITIVE — AB
Comment: NEGATIVE
Comment: NEGATIVE
Comment: NEGATIVE

## 2019-04-30 LAB — RPR: RPR Ser Ql: NONREACTIVE

## 2019-05-01 ENCOUNTER — Other Ambulatory Visit: Payer: Self-pay | Admitting: Certified Nurse Midwife

## 2019-05-01 DIAGNOSIS — O24410 Gestational diabetes mellitus in pregnancy, diet controlled: Secondary | ICD-10-CM

## 2019-05-01 MED ORDER — GLUCOSE BLOOD VI STRP: ORAL_STRIP | 12 refills | Status: DC

## 2019-05-01 MED ORDER — ACCU-CHEK GUIDE W/DEVICE KIT: 1.0000 | PACK | Freq: Four times a day (QID) | 0 refills | Status: DC

## 2019-05-01 MED ORDER — ACCU-CHEK FASTCLIX LANCETS MISC: 1.0000 | Freq: Four times a day (QID) | 12 refills | Status: DC

## 2019-05-01 MED ORDER — TERCONAZOLE 0.4 % VA CREA: 1.0000 | TOPICAL_CREAM | Freq: Every day | VAGINAL | 0 refills | Status: DC

## 2019-05-01 NOTE — Addendum Note (Signed)
Addended by: Sharyon Cable on: 05/01/2019 09:34 PM   Modules accepted: Orders

## 2019-05-02 ENCOUNTER — Encounter: Payer: Self-pay | Admitting: Certified Nurse Midwife

## 2019-05-02 DIAGNOSIS — Z8632 Personal history of gestational diabetes: Secondary | ICD-10-CM | POA: Insufficient documentation

## 2019-05-02 DIAGNOSIS — O24419 Gestational diabetes mellitus in pregnancy, unspecified control: Secondary | ICD-10-CM | POA: Insufficient documentation

## 2019-05-02 LAB — URINE CULTURE, OB REFLEX

## 2019-05-02 LAB — CULTURE, OB URINE

## 2019-05-04 ENCOUNTER — Other Ambulatory Visit: Payer: Self-pay | Admitting: *Deleted

## 2019-05-04 DIAGNOSIS — O24410 Gestational diabetes mellitus in pregnancy, diet controlled: Secondary | ICD-10-CM

## 2019-05-04 DIAGNOSIS — O24419 Gestational diabetes mellitus in pregnancy, unspecified control: Secondary | ICD-10-CM

## 2019-05-13 ENCOUNTER — Other Ambulatory Visit: Payer: Self-pay

## 2019-05-13 ENCOUNTER — Encounter: Payer: Managed Care, Other (non HMO) | Attending: Certified Nurse Midwife | Admitting: Registered"

## 2019-05-13 ENCOUNTER — Encounter: Payer: Self-pay | Admitting: Registered"

## 2019-05-13 DIAGNOSIS — O99810 Abnormal glucose complicating pregnancy: Secondary | ICD-10-CM | POA: Diagnosis present

## 2019-05-13 NOTE — Progress Notes (Signed)
Patient was seen on 05/13/19 for Gestational Diabetes self-management class [facility]. The following learning objectives were met by the patient during this course:   States the definition of Gestational Diabetes  States why dietary management is important in controlling blood glucose  Describes the effects each nutrient has on blood glucose levels  Demonstrates ability to create a balanced meal plan  Demonstrates carbohydrate counting   States when to check blood glucose levels  Demonstrates proper blood glucose monitoring techniques  States the effect of stress and exercise on blood glucose levels  States the importance of limiting caffeine and abstaining from alcohol and smoking  Blood glucose monitor given: none  Patient instructed to monitor glucose levels: FBS: 60 - <95; 1 hour: <140; 2 hour: <120  Patient received handouts:  Nutrition Diabetes and Pregnancy, including carb counting list  Patient will be seen for follow-up as needed.

## 2019-05-25 ENCOUNTER — Encounter: Payer: Self-pay | Admitting: Family Medicine

## 2019-05-25 ENCOUNTER — Other Ambulatory Visit: Payer: Self-pay

## 2019-05-25 ENCOUNTER — Telehealth (INDEPENDENT_AMBULATORY_CARE_PROVIDER_SITE_OTHER): Payer: Medicaid Other | Admitting: Family Medicine

## 2019-05-25 VITALS — BP 120/73 | HR 90

## 2019-05-25 DIAGNOSIS — Z3A31 31 weeks gestation of pregnancy: Secondary | ICD-10-CM

## 2019-05-25 DIAGNOSIS — B951 Streptococcus, group B, as the cause of diseases classified elsewhere: Secondary | ICD-10-CM

## 2019-05-25 DIAGNOSIS — O0993 Supervision of high risk pregnancy, unspecified, third trimester: Secondary | ICD-10-CM

## 2019-05-25 DIAGNOSIS — O24410 Gestational diabetes mellitus in pregnancy, diet controlled: Secondary | ICD-10-CM

## 2019-05-25 DIAGNOSIS — O0990 Supervision of high risk pregnancy, unspecified, unspecified trimester: Secondary | ICD-10-CM

## 2019-05-25 DIAGNOSIS — O2343 Unspecified infection of urinary tract in pregnancy, third trimester: Secondary | ICD-10-CM

## 2019-05-25 NOTE — Patient Instructions (Signed)

## 2019-05-25 NOTE — Progress Notes (Signed)
I connected with  Terri Combs on 05/25/19 at  2:15 PM EST by telephone and verified that I am speaking with the correct person using two identifiers.   I discussed the limitations, risks, security and privacy concerns of performing an evaluation and management service by telephone and the availability of in person appointments. I also discussed with the patient that there may be a patient responsible charge related to this service. The patient expressed understanding and agreed to proceed.  Ralene Bathe, RN 05/25/2019  2:36 PM

## 2019-05-25 NOTE — Progress Notes (Signed)
   TELEHEALTH OBSTETRICS PRENATAL VIRTUAL VIDEO VISIT ENCOUNTER NOTE  Provider location: Center for [employer] at [place]   I connected with Auburn Lorey on 05/25/19 at  2:15 PM EST by MyChart Video Encounter at home and verified that I am speaking with the correct person using two identifiers.   I discussed the limitations, risks, security and privacy concerns of performing an evaluation and management service virtually and the availability of in person appointments. I also discussed with the patient that there may be a patient responsible charge related to this service. The patient expressed understanding and agreed to proceed. Subjective:  Terri Combs is a 24 y.o. G1P0000 at [redacted]w[redacted]d being seen today for ongoing prenatal care.  She is currently monitored for the following issues for this high-risk pregnancy and has Supervision of high risk pregnancy, antepartum; Scoliosis; GBS (group B streptococcus) UTI complicating pregnancy; Hemoglobin Constant Spring trait; and Gestational diabetes mellitus (GDM) on their problem list.  Patient reports no complaints.  Contractions: Not present. Vag. Bleeding: None.  Movement: Present. Denies any leaking of fluid.   The following portions of the patient's history were reviewed and updated as appropriate: allergies, current medications, past family history, past medical history, past social history, past surgical history and problem list.   Objective:   Vitals:   05/25/19 1437  BP: 120/73  Pulse: 90    Fetal Status:     Movement: Present     General:  Alert, oriented and cooperative. Patient is in no acute distress.  Respiratory: Normal respiratory effort, no problems with respiration noted  Mental Status: Normal mood and affect. Normal behavior. Normal judgment and thought content.  Rest of physical exam deferred due to type of encounter  Imaging: No results found.  Assessment and Plan:  Pregnancy: G1P0000 at [redacted]w[redacted]d 1. Diet controlled gestational  diabetes mellitus (GDM) in third trimester FBS 69, 79, 82, 81, 87, 80, 81, 91 2 hour pp 79, 121, 120, 83, 97, 99, 75, 81, 101, 60, 95, 110, 101, 99, 105, 121, 97, 105, 106, 89, 108, 72, 86 Continue diet--u/s for growth at 37 is weeks - [place] MFM OB FOLLOW UP; Future  2. Group B Streptococcus urinary tract infection affecting pregnancy in second trimester Will need treatment in labor  3. Supervision of high risk pregnancy, antepartum Continue prenatal care.   Preterm labor symptoms and general obstetric precautions including but not limited to vaginal bleeding, contractions, leaking of fluid and fetal movement were reviewed in detail with the patient. I discussed the assessment and treatment plan with the patient. The patient was provided an opportunity to ask questions and all were answered. The patient agreed with the plan and demonstrated an understanding of the instructions. The patient was advised to call back or seek an in-person office evaluation/go to MAU at [facility] for any urgent or concerning symptoms. Please refer to After Visit Summary for other counseling recommendations.   I provided 26 minutes of face-to-face time during this encounter.  Return in 2 weeks (on 06/08/2019) for virtual.  Future Appointments  Date Time Provider Department Center  06/08/2019  4:15 PM Adam Phenix, MD [facility]    Reva Bores, MD Center for [facility], [facility]

## 2019-05-27 ENCOUNTER — Telehealth: Payer: Medicaid Other | Admitting: Nurse Practitioner

## 2019-06-08 ENCOUNTER — Telehealth (INDEPENDENT_AMBULATORY_CARE_PROVIDER_SITE_OTHER): Payer: Managed Care, Other (non HMO) | Admitting: Obstetrics & Gynecology

## 2019-06-08 VITALS — BP 121/71 | HR 89

## 2019-06-08 DIAGNOSIS — O24410 Gestational diabetes mellitus in pregnancy, diet controlled: Secondary | ICD-10-CM

## 2019-06-08 DIAGNOSIS — B951 Streptococcus, group B, as the cause of diseases classified elsewhere: Secondary | ICD-10-CM | POA: Diagnosis not present

## 2019-06-08 DIAGNOSIS — O0993 Supervision of high risk pregnancy, unspecified, third trimester: Secondary | ICD-10-CM | POA: Diagnosis not present

## 2019-06-08 DIAGNOSIS — G43809 Other migraine, not intractable, without status migrainosus: Secondary | ICD-10-CM

## 2019-06-08 DIAGNOSIS — O2342 Unspecified infection of urinary tract in pregnancy, second trimester: Secondary | ICD-10-CM

## 2019-06-08 DIAGNOSIS — O2343 Unspecified infection of urinary tract in pregnancy, third trimester: Secondary | ICD-10-CM

## 2019-06-08 DIAGNOSIS — O0990 Supervision of high risk pregnancy, unspecified, unspecified trimester: Secondary | ICD-10-CM

## 2019-06-08 DIAGNOSIS — G43909 Migraine, unspecified, not intractable, without status migrainosus: Secondary | ICD-10-CM | POA: Insufficient documentation

## 2019-06-08 DIAGNOSIS — Z3A33 33 weeks gestation of pregnancy: Secondary | ICD-10-CM

## 2019-06-08 MED ORDER — METOCLOPRAMIDE HCL 5 MG PO TABS: 5.0000 mg | ORAL_TABLET | Freq: Four times a day (QID) | ORAL | 0 refills | Status: DC | PRN

## 2019-06-08 NOTE — Progress Notes (Signed)
I connected with  Desma Templeman on 06/08/19 at  4:15 PM EST by telephone and verified that I am speaking with the correct person using two identifiers.   I discussed the limitations, risks, security and privacy concerns of performing an evaluation and management service by telephone and the availability of in person appointments. I also discussed with the patient that there may be a patient responsible charge related to this service. The patient expressed understanding and agreed to proceed.  Henrietta Dine, CMA 06/08/2019  4:09 PM

## 2019-06-08 NOTE — Progress Notes (Signed)
   TELEHEALTH VIRTUAL OBSTETRICS VISIT ENCOUNTER NOTE  I connected with Meridith Klare on 06/08/19 at  4:15 PM EST by telephone at home and verified that I am speaking with the correct person using two identifiers.   I discussed the limitations, risks, security and privacy concerns of performing an evaluation and management service by telephone and the availability of in person appointments. I also discussed with the patient that there may be a patient responsible charge related to this service. The patient expressed understanding and agreed to proceed.  Subjective:  Terri Combs is a 24 y.o. G1P0000 at [redacted]w[redacted]d being followed for ongoing prenatal care.  She is currently monitored for the following issues for this high-risk pregnancy and has Supervision of high risk pregnancy, antepartum; Scoliosis; GBS (group B streptococcus) UTI complicating pregnancy; Hemoglobin Constant Spring trait; Gestational diabetes mellitus (GDM); and Migraine on their problem list.  Patient reports rash on abdomen and extremities with itching, Migraines. Reports fetal movement. Denies any contractions, bleeding or leaking of fluid.   The following portions of the patient's history were reviewed and updated as appropriate: allergies, current medications, past family history, past medical history, past social history, past surgical history and problem list.   Objective:   General:  Alert, oriented and cooperative.   Mental Status: Normal mood and affect perceived. Normal judgment and thought content.  Rest of physical exam deferred due to type of encounter  Assessment and Plan:  Pregnancy: G1P0000 at [redacted]w[redacted]d 1. Diet controlled gestational diabetes mellitus (GDM) in third trimester State all FBS in range with sporadic PP above 120 to 135, 155  2. Supervision of high risk pregnancy, antepartum Describes rash and itching, most likely PUPPP but need to r/o cholestasis  3. Group B Streptococcus urinary tract infection affecting  pregnancy in second trimester Treat during labor  4. Other migraine without status migrainosus, not intractable Migraine with nausea, try medication, Tylenol - metoCLOPramide (REGLAN) 5 MG tablet; Take 1 tablet (5 mg total) by mouth every 6 (six) hours as needed for nausea.  Dispense: 20 tablet; Refill: 0  Preterm labor symptoms and general obstetric precautions including but not limited to vaginal bleeding, contractions, leaking of fluid and fetal movement were reviewed in detail with the patient.  I discussed the assessment and treatment plan with the patient. The patient was provided an opportunity to ask questions and all were answered. The patient agreed with the plan and demonstrated an understanding of the instructions. The patient was advised to call back or seek an in-person office evaluation/go to MAU at [facility] for any urgent or concerning symptoms. Please refer to After Visit Summary for other counseling recommendations.   I provided 12 minutes of non-face-to-face time during this encounter.  Return in about 2 days (around 06/10/2019) for in person evaluate itching.  No future appointments.  Scheryl Darter, MD Center for [facility], [facility]

## 2019-06-08 NOTE — Progress Notes (Signed)
Pt records Glucose on paper logs.

## 2019-06-08 NOTE — Patient Instructions (Signed)

## 2019-06-10 ENCOUNTER — Ambulatory Visit (INDEPENDENT_AMBULATORY_CARE_PROVIDER_SITE_OTHER): Payer: Medicaid Other | Admitting: Obstetrics & Gynecology

## 2019-06-10 ENCOUNTER — Other Ambulatory Visit: Payer: Self-pay

## 2019-06-10 VITALS — BP 118/78 | HR 113 | Wt 165.1 lb

## 2019-06-10 DIAGNOSIS — O24410 Gestational diabetes mellitus in pregnancy, diet controlled: Secondary | ICD-10-CM

## 2019-06-10 DIAGNOSIS — O0990 Supervision of high risk pregnancy, unspecified, unspecified trimester: Secondary | ICD-10-CM

## 2019-06-10 DIAGNOSIS — O2686 Pruritic urticarial papules and plaques of pregnancy (PUPPP): Secondary | ICD-10-CM

## 2019-06-10 DIAGNOSIS — Z3A33 33 weeks gestation of pregnancy: Secondary | ICD-10-CM

## 2019-06-10 MED ORDER — HYDROCORTISONE 1 % EX LOTN: 1.0000 "application " | TOPICAL_LOTION | Freq: Two times a day (BID) | CUTANEOUS | 1 refills | Status: DC

## 2019-06-10 NOTE — Progress Notes (Signed)
   PRENATAL VISIT NOTE  Subjective:  Terri Combs is a 24 y.o. G1P0000 at [redacted]w[redacted]d being seen today for ongoing prenatal care.  She is currently monitored for the following issues for this high-risk pregnancy and has Supervision of high risk pregnancy, antepartum; Scoliosis; GBS (group B streptococcus) UTI complicating pregnancy; Hemoglobin Constant Spring trait; Gestational diabetes mellitus (GDM); and Migraine on their problem list.  Patient reports rash and itching abdomen spreading to thighs, used HC cream.  Contractions: Irritability. Vag. Bleeding: None.  Movement: Present. Denies leaking of fluid.   The following portions of the patient's history were reviewed and updated as appropriate: allergies, current medications, past family history, past medical history, past social history, past surgical history and problem list.   Objective:   Vitals:   06/10/19 0950  BP: 118/78  Pulse: (!) 113  Weight: 165 lb 1.6 oz (74.9 kg)    Fetal Status: Fetal Heart Rate (bpm): 157   Movement: Present     General:  Alert, oriented and cooperative. Patient is in no acute distress.  Skin: Skin is warm and dry. No rash noted.   Cardiovascular: Normal heart rate noted  Respiratory: Normal respiratory effort, no problems with respiration noted  Abdomen: Soft, gravid, appropriate for gestational age.  Pain/Pressure: Present   Rash c/w PUPPP  Pelvic: Cervical exam deferred        Extremities: Normal range of motion.  Edema: None  Mental Status: Normal mood and affect. Normal behavior. Normal judgment and thought content.   Assessment and Plan:  Pregnancy: G1P0000 at [redacted]w[redacted]d 1. Supervision of high risk pregnancy, antepartum PUPPP - Bile acids, total  2. PUPPP (pruritic urticarial papules and plaques of pregnancy) Won't use systemic steroid as may worsen BG control. May use Benadryl and HC lotion  3. Diet controlled gestational diabetes mellitus (GDM) in third trimester FBS and PP in range  Preterm labor  symptoms and general obstetric precautions including but not limited to vaginal bleeding, contractions, leaking of fluid and fetal movement were reviewed in detail with the patient. Please refer to After Visit Summary for other counseling recommendations.   Return in about 1 week (around 06/17/2019) for virtual.  No future appointments.  Scheryl Darter, MD

## 2019-06-10 NOTE — Progress Notes (Signed)
Pt records Glucose readings on Paper but left it in her car. Pt states has a rash & is itching on belly, thighs, buttocks.

## 2019-06-10 NOTE — Patient Instructions (Signed)
PUPPP Rash ° °Pruritic urticarial papules and plaques of pregnancy (PUPPP) is a rash that develops during pregnancy, or sometimes shortly after giving birth. The rash consists of small red bumps that sometimes form large, scaly, red patches of skin (plaques). The rash goes away soon after childbirth, and it does not leave scars or harm you or your baby. °PUPPP usually appears during the last few weeks of pregnancy. It usually does not return during later pregnancies. °What are the causes? °The cause of this condition is not known. However, it may be related to your skin stretching rapidly during the later stages of pregnancy. °What increases the risk? °You are more likely to develop this condition if: °· You are pregnant for the first time. °· You are pregnant with more than one baby. °What are the signs or symptoms? °The main symptom of this condition is a very itchy rash on the abdomen. The rash often looks red and raised, and sometimes small blisters form in the center of the rash patches. The rash may spread to the legs or arms. The skin around the rash may turn pale. °How is this diagnosed? °This condition is diagnosed based on a physical exam. Your health care provider will examine your skin and ask questions about your symptoms. In some cases, your health care provider may take a sample of your skin (biopsy) for testing in order to rule out other causes of skin conditions. °How is this treated? °The goal of treatment is to stop the itching and keep the rash from spreading. Common treatment options include medicines that relieve or lessen itching (corticosteroids or antihistamines). These medicines may be: °· Creams. °· Ointments. °· Pills to be taken by mouth (orally). °Follow these instructions at home: °Skin care °· Apply any creams or ointments as directed by your health care provider. °· Wear loose clothing. °· Keep your skin clean and dry. °· Do not scratch the rash. °· Do not bathe in hot water. Take  cool baths to soothe your skin. Try adding baking soda or oatmeal to the water to reduce itching. °· Apply cool, wet cloths (compresses) to itchy areas as told by your health care provider. °General instructions °· Take over-the-counter and prescription medicines only as told by your health care provider. °· Keep all follow-up visits as told by your health care provider. This is important. °Contact a health care provider if: °· Your itchiness does not go away after treatment. °· Your rash continues to spread. °· You are unable to sleep because of the irritation. °Summary °· Pruritic urticarial papules and plaques of pregnancy (PUPPP) is a rash that develops during pregnancy, or sometimes shortly after giving birth. °· The rash goes away soon after giving birth. It does not leave scars or harm you or your baby. °· You are more likely to develop this condition if this is your first pregnancy or you are pregnant with more than one baby. °· The goal of treatment is to stop the itching and keep the rash from spreading. °This information is not intended to replace advice given to you by your health care provider. Make sure you discuss any questions you have with your health care provider. °Document Revised: 07/24/2018 Document Reviewed: 06/20/2016 °Elsevier Patient Education © 2020 [employer] ° °

## 2019-06-11 LAB — BILE ACIDS, TOTAL: Bile Acids Total: 3.1 umol/L (ref 0.0–10.0)

## 2019-06-19 ENCOUNTER — Telehealth (INDEPENDENT_AMBULATORY_CARE_PROVIDER_SITE_OTHER): Payer: Managed Care, Other (non HMO) | Admitting: Obstetrics & Gynecology

## 2019-06-19 ENCOUNTER — Other Ambulatory Visit: Payer: Self-pay

## 2019-06-19 DIAGNOSIS — M41129 Adolescent idiopathic scoliosis, site unspecified: Secondary | ICD-10-CM

## 2019-06-19 DIAGNOSIS — O0993 Supervision of high risk pregnancy, unspecified, third trimester: Secondary | ICD-10-CM | POA: Diagnosis not present

## 2019-06-19 DIAGNOSIS — O2342 Unspecified infection of urinary tract in pregnancy, second trimester: Secondary | ICD-10-CM

## 2019-06-19 DIAGNOSIS — B951 Streptococcus, group B, as the cause of diseases classified elsewhere: Secondary | ICD-10-CM

## 2019-06-19 DIAGNOSIS — D563 Thalassemia minor: Secondary | ICD-10-CM | POA: Diagnosis not present

## 2019-06-19 DIAGNOSIS — O24410 Gestational diabetes mellitus in pregnancy, diet controlled: Secondary | ICD-10-CM

## 2019-06-19 DIAGNOSIS — O0990 Supervision of high risk pregnancy, unspecified, unspecified trimester: Secondary | ICD-10-CM

## 2019-06-19 DIAGNOSIS — Z6834 Body mass index (BMI) 34.0-34.9, adult: Secondary | ICD-10-CM

## 2019-06-19 NOTE — Progress Notes (Signed)
   TELEHEALTH VIRTUAL OBSTETRICS VISIT ENCOUNTER NOTE  I connected with Terri Combs on 06/19/19 at 10:35 AM EST by telephone at home and verified that I am speaking with the correct person using two identifiers.   I discussed the limitations, risks, security and privacy concerns of performing an evaluation and management service by telephone and the availability of in person appointments. I also discussed with the patient that there may be a patient responsible charge related to this service. The patient expressed understanding and agreed to proceed.  Subjective:  Terri Combs is a 24 y.o. G1P0000 at [redacted]w[redacted]d being followed for ongoing prenatal care.  She is currently monitored for the following issues for this high-risk pregnancy and has Supervision of high risk pregnancy, antepartum; Scoliosis; GBS (group B streptococcus) UTI complicating pregnancy; Hemoglobin Constant Spring trait; Gestational diabetes mellitus (GDM); and Migraine on their problem list.  Patient reports itching is improved using an OTC lotion. Reports fetal movement. Denies any contractions, bleeding or leaking of fluid.   The following portions of the patient's history were reviewed and updated as appropriate: allergies, current medications, past family history, past medical history, past social history, past surgical history and problem list.   Objective:   General:  Alert, oriented and cooperative.   Mental Status: Normal mood and affect perceived. Normal judgment and thought content.  Rest of physical exam deferred due to type of encounter  Assessment and Plan:  Pregnancy: G1P0000 at [redacted]w[redacted]d 1. Diet controlled gestational diabetes mellitus (GDM) in third trimester F/u growth due to GDM - [place] MFM OB FOLLOW UP; Future  2. Hemoglobin Constant Spring trait   3. Group B Streptococcus urinary tract infection affecting pregnancy in second trimester No need to retest, treat during labor  4. Supervision of high risk pregnancy,  antepartum   5. Adolescent idiopathic scoliosis, unspecified spinal region  - [place] MFM OB FOLLOW UP; Future  Preterm labor symptoms and general obstetric precautions including but not limited to vaginal bleeding, contractions, leaking of fluid and fetal movement were reviewed in detail with the patient.  I discussed the assessment and treatment plan with the patient. The patient was provided an opportunity to ask questions and all were answered. The patient agreed with the plan and demonstrated an understanding of the instructions. The patient was advised to call back or seek an in-person office evaluation/go to MAU at [facility] for any urgent or concerning symptoms. Please refer to After Visit Summary for other counseling recommendations.   I provided 12 minutes of non-face-to-face time during this encounter.  Return in about 1 week (around 06/26/2019) for Doesn't need GBS, GC/CT only, pt is GBS pos.  No future appointments.  Scheryl Darter, MD Center for [facility], [facility]

## 2019-06-19 NOTE — Patient Instructions (Signed)

## 2019-06-19 NOTE — Progress Notes (Signed)
I connected with  Terri Combs on 06/19/19 at 10:35 AM EST by telephone and verified that I am speaking with the correct person using two identifiers.   I discussed the limitations, risks, security and privacy concerns of performing an evaluation and management service by telephone and the availability of in person appointments. I also discussed with the patient that there may be a patient responsible charge related to this service. The patient expressed understanding and agreed to proceed.  Ralene Bathe, RN 06/19/2019  10:36 AM

## 2019-06-29 ENCOUNTER — Ambulatory Visit (INDEPENDENT_AMBULATORY_CARE_PROVIDER_SITE_OTHER): Payer: Managed Care, Other (non HMO) | Admitting: Family Medicine

## 2019-06-29 ENCOUNTER — Encounter: Payer: Self-pay | Admitting: Family Medicine

## 2019-06-29 ENCOUNTER — Other Ambulatory Visit: Payer: Self-pay

## 2019-06-29 VITALS — BP 130/87 | HR 99 | Wt 166.8 lb

## 2019-06-29 DIAGNOSIS — Z113 Encounter for screening for infections with a predominantly sexual mode of transmission: Secondary | ICD-10-CM

## 2019-06-29 DIAGNOSIS — O2343 Unspecified infection of urinary tract in pregnancy, third trimester: Secondary | ICD-10-CM

## 2019-06-29 DIAGNOSIS — N898 Other specified noninflammatory disorders of vagina: Secondary | ICD-10-CM

## 2019-06-29 DIAGNOSIS — Z23 Encounter for immunization: Secondary | ICD-10-CM

## 2019-06-29 DIAGNOSIS — B951 Streptococcus, group B, as the cause of diseases classified elsewhere: Secondary | ICD-10-CM

## 2019-06-29 DIAGNOSIS — Z658 Other specified problems related to psychosocial circumstances: Secondary | ICD-10-CM

## 2019-06-29 DIAGNOSIS — O0990 Supervision of high risk pregnancy, unspecified, unspecified trimester: Secondary | ICD-10-CM

## 2019-06-29 DIAGNOSIS — B373 Candidiasis of vulva and vagina: Secondary | ICD-10-CM

## 2019-06-29 DIAGNOSIS — D563 Thalassemia minor: Secondary | ICD-10-CM

## 2019-06-29 DIAGNOSIS — O2342 Unspecified infection of urinary tract in pregnancy, second trimester: Secondary | ICD-10-CM

## 2019-06-29 DIAGNOSIS — Z3A36 36 weeks gestation of pregnancy: Secondary | ICD-10-CM

## 2019-06-29 DIAGNOSIS — O24410 Gestational diabetes mellitus in pregnancy, diet controlled: Secondary | ICD-10-CM

## 2019-06-29 NOTE — Progress Notes (Signed)
   PRENATAL VISIT NOTE  Subjective:  Terri Combs is a 24 y.o. G1P0000 at [redacted]w[redacted]d being seen today for ongoing prenatal care.  She is currently monitored for the following issues for this high-risk pregnancy and has Supervision of high risk pregnancy, antepartum; Scoliosis; GBS (group B streptococcus) UTI complicating pregnancy; Hemoglobin Constant Spring trait; Gestational diabetes mellitus (GDM); and Migraine on their problem list.  Patient reports no complaints.  Contractions: Irritability. Vag. Bleeding: None.  Movement: (!) Decreased. Denies leaking of fluid.   The following portions of the patient's history were reviewed and updated as appropriate: allergies, current medications, past family history, past medical history, past social history, past surgical history and problem list.   Objective:   Vitals:   06/29/19 1329  BP: 130/87  Pulse: 99  Weight: 166 lb 12.8 oz (75.7 kg)    Fetal Status: Fetal Heart Rate (bpm): 140 Fundal Height: 36 cm Movement: (!) Decreased  Presentation: Vertex  General:  Alert, oriented and cooperative. Patient is in no acute distress.  Skin: Skin is warm and dry. No rash noted.   Cardiovascular: Normal heart rate noted  Respiratory: Normal respiratory effort, no problems with respiration noted  Abdomen: Soft, gravid, appropriate for gestational age.  Pain/Pressure: Absent     Pelvic: Cervical exam performed Dilation: 2 Effacement (%): 80 Station: -3  Extremities: Normal range of motion.  Edema: Trace  Mental Status: Normal mood and affect. Normal behavior. Normal judgment and thought content.   Assessment and Plan:  Pregnancy: G1P0000 at [redacted]w[redacted]d 1. Supervision of high risk pregnancy, antepartum FHT and FH normal. Having discharge Wants to start maternity leave now. - Cervicovaginal ancillary only( [place])  2. Diet controlled gestational diabetes mellitus (GDM) in third trimester Having difficulty checking CBGs 2hrs after eating. Didn't bring logbook.  Reports fasting cbgs as controlled. CBGs after meals occasionally elevated, but sporatic. Check 1hr after eating to see if improves compliance.  Delivery by due date, although offered induction at 39wks if desired due to more favorable cervix.  3. Group B Streptococcus urinary tract infection affecting pregnancy in second trimester Intrapartum ppx  4. Hemoglobin Constant Spring trait   5. Psychosocial stressors  - Ambulatory referral to [facility]  Preterm labor symptoms and general obstetric precautions including but not limited to vaginal bleeding, contractions, leaking of fluid and fetal movement were reviewed in detail with the patient. Please refer to After Visit Summary for other counseling recommendations.   No follow-ups on file.  Future Appointments  Date Time Provider Department Center  07/01/2019 12:45 PM [facility] NURSE [facility] MFC-US  07/01/2019 12:45 PM [facility] [place] 2 WH-MFCUS MFC-US  07/06/2019  4:15 PM Levie Heritage, DO [facility] WOC    Levie Heritage, DO

## 2019-06-29 NOTE — Patient Instructions (Signed)
[facility] [place] [facility]  Department of Social Services[place] [address], [place], [place] [address] [phone]) [phone]   or  www.[place].gov/[place]/human-services/social-services **SNAP/EBT/ Other nutritional benefits  [place] DHHS-Public Health-WIC [address], [place], [place] [address] [phone]) [phone]  or  https://[place].gov/[place]-county/human-services/health-department **WIC for  women who are pregnant and postpartum, infants and children up to 5 years old  [facility] [address], [place], [place] [address] [phone]) [phone]   or   www.theblessedtable.org  **Food pantry  [facility] [address], [place], [place] [address] [phone]   or   https://brotherkolbes.godaddysites.com  **Emergency food and prepared meals  [facility] [address], [place], [place] [address] [phone]) [phone]   or   www.cedargrovetop.us **Food pantry  [facility] [address], [place], [place] [address] [phone]) [phone]   or   www.facebook.com/[facility]-116430931718202 **Food pantry  [facility] [address], [place], [place] [address] [phone]) [phone] **Food pantry  [facility] [address], [place], [place] [address] [phone]) [phone]   or   www.greensborourbanministry.org  **Food pantry and prepared meals  [facility] [address], [place], [place] [address] https://jfsgreensboro.org/  **Food pantry  [facility] [address],  [place], [place] [address] [phone]) [phone]   or   www.lbcnow.org  **Food pantry  [facility] [address], [place], [place] [address] [phone]) [phone]   or   http://www.onestepfurther.com **Food pantry, nutrition education, gardening activities  [facility] [address], [place], [place] [address] [phone]) [phone] **Food pantry  [facility] [place] [address], [place], [place] [address] [phone]) [phone]   or   www.salvationarmyofgreensboro.org **Food pantry  [facility] [place] [address], [place], [place] [address] [phone]) [phone]   or   http://senior-resources-[place].org **Meals on Wheels Program  [facility] [address], [place], [place] [address] [phone]) [phone]   or   www.stmattchurch.com  **Food pantry  [facility] [address], [place], [place] [address] [phone]   or   vandaliapresbyterianchurch.org **Food pantry  [facility] [facility]  [facility] [address], [place], [place] [address] [phone]) [phone]   or   www.facebook.com/[facility]C Food pantry  [facility] [address], [place], [place] [address] [phone]) [phone] **Food pantry  [place] [facility]   Department of [place] Health[place] [address], [place], [place] [address] [phone]) [phone]   or   www.co..[place].us/ph/  [place] DHHS-Public Health-WIC ([place]) [address], [place], [place] [address] [phone]) [phone]   or   https://www.[place]nc.gov/[place]-county/human-services/health-department **WIC for pregnant and postpartum women, infants and children up to 5 years old  [facility] [address], [place], [place] [address] [phone]) [phone] **Food pantry  [facility] [address], [place], [place] [address] [phone]   or   emerywoodbaptistchurch.com *Food pantry  [facility] [address], [place], [place] [address] [phone]) [phone]   or   www.fcch[place].org **Food pantry  [facility] [address], [place],  [address] [phone]) [phone]   or    www.helpinghandshp.org **Food pantry  [facility] [address], [place],  [address] [phone]) [phone]   or   www.facebook.com/KBCI1 **Food Pantry  [facility] [address], [place], [place] [address] [phone]) [phone]   or   www.abbottscreek.org **Food pantry  [facility] [place] [phone]   **Delivers meals  [facility] [address], [place], [place] [address] [phone]) [phone]   or   nbfgm.sundaystreamwebsites.com  **Food pantry  [facility] [place] [address], [place], [place] [address] [phone]) [phone]   or   www.odm-hp.org  **Food pantry  [facility] [address], [place], [place] [address] [phone]   or   R2live.tv **Food pantry  [facility] [address], [place], [place] [address] [phone]) [phone]   or   www.salvationarmycarolinas.org/[place] **Emergency food and pet food  [facility] [address], [place], [place] [address] [phone]   or   www.senioradults.org **Congregate and delivered meals to older adults  [facility] [place] [address], [place], [place] [address] [phone]) [phone]   or   www.unitedwayhp.org **Back Pack Program for elementary school students  [facility] [address], [place], [place] [address] [phone]) [phone]   or   www.wardstreetcommunityresources.org **Food pantry  [facility] [address], [place], [place] [address] [phone]) [phone]   or   www.ywcahp.com **Emergency food, nutrition classes, food budgeting  [facility] [place]  Department [place]  [address], [place], [place] [address] [phone]) [phone]  or   www.co.[place].us/pview.aspx?id=14850&catid=407 **SNAP/Other nutrition benefits  [place] Department of Health and Human Services [address], [place], [place] [address] [phone]   or  https://www.rockinghamcountydhhs.org/  **SNAP/Other nutrition benefits  [place] Department of Public Health-WIC [facility] [address], [place], [place] [address] [phone]) [phone]  or  http://www.[place]countypublichealth.org **WIC for pregnant and postpartum women, infants and children up to 5 years old  Aging, Disability and Transit Services[place] [address], [place], [place] [address] [phone]) [phone]  or www.adtsrc.org **Prepared meals for older adults  [facility] [address], [place], [place] [address] [phone]) [phone]  or  www.calvary4you.com **Food pantry  [facility] [address], [place], [place] [address] [phone]) [phone]   or   https://www.handsofgod.org/  **Food pantry  [facility] [address], [place], [place] [address] [phone]) [phone] **Food pantry  [facility] [address], [place], [place] [address] [phone]) [phone]   or   www.ci.[place].us/government/parks_and_recreation/senior_center/index.php **Congregate meal for older adults  [facility] [address], , [place] [address] [phone]) [phone]   or www.reidsvilleoutreachcenter.org  **Food pantry  [facility] [address], [place], [place] [address] [phone]   or   www.salvationarmycarolinas.org/commands/ **Food pantry    

## 2019-06-29 NOTE — Addendum Note (Signed)
Addended by: Marjo Bicker on: 06/29/2019 03:36 PM   Modules accepted: Orders

## 2019-06-30 DIAGNOSIS — Z029 Encounter for administrative examinations, unspecified: Secondary | ICD-10-CM

## 2019-06-30 DIAGNOSIS — B379 Candidiasis, unspecified: Secondary | ICD-10-CM

## 2019-06-30 LAB — CERVICOVAGINAL ANCILLARY ONLY
Bacterial Vaginitis (gardnerella): NEGATIVE
Candida Glabrata: NEGATIVE
Candida Vaginitis: POSITIVE — AB
Chlamydia: NEGATIVE
Comment: NEGATIVE
Comment: NEGATIVE
Comment: NEGATIVE
Comment: NEGATIVE
Comment: NEGATIVE
Comment: NORMAL
Neisseria Gonorrhea: NEGATIVE
Trichomonas: NEGATIVE

## 2019-06-30 MED ORDER — TERCONAZOLE 0.4 % VA CREA: 1.0000 | TOPICAL_CREAM | Freq: Every day | VAGINAL | 0 refills | Status: DC

## 2019-07-01 ENCOUNTER — Ambulatory Visit (HOSPITAL_COMMUNITY): Payer: Managed Care, Other (non HMO) | Admitting: *Deleted

## 2019-07-01 ENCOUNTER — Ambulatory Visit (HOSPITAL_COMMUNITY)
Admission: RE | Admit: 2019-07-01 | Discharge: 2019-07-01 | Disposition: A | Discharge status: Home / Self Care | Payer: Managed Care, Other (non HMO) | Source: Ambulatory Visit | Attending: Obstetrics and Gynecology | Admitting: Obstetrics and Gynecology

## 2019-07-01 ENCOUNTER — Encounter (HOSPITAL_COMMUNITY): Payer: Self-pay

## 2019-07-01 ENCOUNTER — Other Ambulatory Visit: Payer: Self-pay

## 2019-07-01 DIAGNOSIS — Z148 Genetic carrier of other disease: Secondary | ICD-10-CM | POA: Diagnosis not present

## 2019-07-01 DIAGNOSIS — Z3685 Encounter for antenatal screening for Streptococcus B: Secondary | ICD-10-CM

## 2019-07-01 DIAGNOSIS — O24410 Gestational diabetes mellitus in pregnancy, diet controlled: Secondary | ICD-10-CM

## 2019-07-01 DIAGNOSIS — Z362 Encounter for other antenatal screening follow-up: Secondary | ICD-10-CM

## 2019-07-01 DIAGNOSIS — D563 Thalassemia minor: Secondary | ICD-10-CM | POA: Diagnosis present

## 2019-07-01 DIAGNOSIS — M41129 Adolescent idiopathic scoliosis, site unspecified: Secondary | ICD-10-CM | POA: Diagnosis present

## 2019-07-01 DIAGNOSIS — O0990 Supervision of high risk pregnancy, unspecified, unspecified trimester: Secondary | ICD-10-CM

## 2019-07-01 DIAGNOSIS — Z3A36 36 weeks gestation of pregnancy: Secondary | ICD-10-CM

## 2019-07-06 ENCOUNTER — Ambulatory Visit (INDEPENDENT_AMBULATORY_CARE_PROVIDER_SITE_OTHER): Payer: Managed Care, Other (non HMO) | Admitting: Family Medicine

## 2019-07-06 ENCOUNTER — Other Ambulatory Visit: Payer: Self-pay

## 2019-07-06 VITALS — BP 125/86 | HR 94 | Wt 167.0 lb

## 2019-07-06 DIAGNOSIS — O24410 Gestational diabetes mellitus in pregnancy, diet controlled: Secondary | ICD-10-CM

## 2019-07-06 DIAGNOSIS — O2342 Unspecified infection of urinary tract in pregnancy, second trimester: Secondary | ICD-10-CM

## 2019-07-06 DIAGNOSIS — B951 Streptococcus, group B, as the cause of diseases classified elsewhere: Secondary | ICD-10-CM

## 2019-07-06 DIAGNOSIS — O2686 Pruritic urticarial papules and plaques of pregnancy (PUPPP): Secondary | ICD-10-CM

## 2019-07-06 DIAGNOSIS — O0990 Supervision of high risk pregnancy, unspecified, unspecified trimester: Secondary | ICD-10-CM

## 2019-07-06 DIAGNOSIS — Z3A37 37 weeks gestation of pregnancy: Secondary | ICD-10-CM

## 2019-07-06 NOTE — Progress Notes (Signed)
Subjective:  Terri Combs is a 24 y.o. G1P0000 at [redacted]w[redacted]d being seen today for ongoing prenatal care.  She is currently monitored for the following issues for this high-risk pregnancy and has Supervision of high risk pregnancy, antepartum; Scoliosis; GBS (group B streptococcus) UTI complicating pregnancy; Hemoglobin Constant Spring trait; Gestational diabetes mellitus (GDM); and Migraine on their problem list.  GDM: Patient diet controlled. Checking 1hr PP.  Fasting: controlled 2hr PP: controlled  Patient reports occasional contractions.  Contractions: Not present. Vag. Bleeding: None.  Movement: Present. Denies leaking of fluid.   The following portions of the patient's history were reviewed and updated as appropriate: allergies, current medications, past family history, past medical history, past social history, past surgical history and problem list. Problem list updated.  Objective:   Vitals:   07/06/19 1620  BP: 125/86  Pulse: 94  Weight: 167 lb (75.8 kg)    Fetal Status: Fetal Heart Rate (bpm): 143 Fundal Height: 37 cm Movement: Present     General:  Alert, oriented and cooperative. Patient is in no acute distress.  Skin: Skin is warm and dry. No rash noted.   Cardiovascular: Normal heart rate noted  Respiratory: Normal respiratory effort, no problems with respiration noted  Abdomen: Soft, gravid, appropriate for gestational age. Pain/Pressure: Present     Pelvic: Vag. Bleeding: None     Cervical exam deferred        Extremities: Normal range of motion.  Edema: Trace  Mental Status: Normal mood and affect. Normal behavior. Normal judgment and thought content.   Urinalysis:      Assessment and Plan:  Pregnancy: G1P0000 at [redacted]w[redacted]d  1. Supervision of high risk pregnancy, antepartum FHT and FH normal  2. Diet controlled gestational diabetes mellitus (GDM) in third trimester Continue diet control Induce by 40wks. Has favorable cervix and is contemplating induction at 39 weeks. She  wanted to continue to think about it.  3. Group B Streptococcus urinary tract infection affecting pregnancy in second trimester Intrapartum PPx  4. PUPPP (pruritic urticarial papules and plaques of pregnancy)   Term labor symptoms and general obstetric precautions including but not limited to vaginal bleeding, contractions, leaking of fluid and fetal movement were reviewed in detail with the patient. Please refer to After Visit Summary for other counseling recommendations.  Return in about 1 week (around 07/13/2019) for HR OB f/u, with me ok to double book.   Levie Heritage, DO

## 2019-07-07 ENCOUNTER — Encounter: Payer: Managed Care, Other (non HMO) | Admitting: Licensed Clinical Social Worker

## 2019-07-07 ENCOUNTER — Ambulatory Visit: Payer: Managed Care, Other (non HMO) | Admitting: Licensed Clinical Social Worker

## 2019-07-07 DIAGNOSIS — Z91199 Patient's noncompliance with other medical treatment and regimen due to unspecified reason: Secondary | ICD-10-CM

## 2019-07-07 DIAGNOSIS — Z5329 Procedure and treatment not carried out because of patient's decision for other reasons: Secondary | ICD-10-CM

## 2019-07-07 NOTE — BH Specialist Note (Signed)
Called pt left message regarding virtual visit  Pt no show

## 2019-07-13 ENCOUNTER — Ambulatory Visit (INDEPENDENT_AMBULATORY_CARE_PROVIDER_SITE_OTHER): Payer: Managed Care, Other (non HMO) | Admitting: Family Medicine

## 2019-07-13 ENCOUNTER — Other Ambulatory Visit: Payer: Self-pay

## 2019-07-13 VITALS — BP 127/79 | HR 98 | Wt 166.5 lb

## 2019-07-13 DIAGNOSIS — O2342 Unspecified infection of urinary tract in pregnancy, second trimester: Secondary | ICD-10-CM

## 2019-07-13 DIAGNOSIS — O2686 Pruritic urticarial papules and plaques of pregnancy (PUPPP): Secondary | ICD-10-CM

## 2019-07-13 DIAGNOSIS — O24410 Gestational diabetes mellitus in pregnancy, diet controlled: Secondary | ICD-10-CM

## 2019-07-13 DIAGNOSIS — Z3A38 38 weeks gestation of pregnancy: Secondary | ICD-10-CM

## 2019-07-13 DIAGNOSIS — B951 Streptococcus, group B, as the cause of diseases classified elsewhere: Secondary | ICD-10-CM

## 2019-07-13 DIAGNOSIS — O0990 Supervision of high risk pregnancy, unspecified, unspecified trimester: Secondary | ICD-10-CM

## 2019-07-13 DIAGNOSIS — O2343 Unspecified infection of urinary tract in pregnancy, third trimester: Secondary | ICD-10-CM

## 2019-07-13 NOTE — Progress Notes (Signed)
   PRENATAL VISIT NOTE  Subjective:  Terri Combs is a 24 y.o. G1P0000 at [redacted]w[redacted]d being seen today for ongoing prenatal care.  She is currently monitored for the following issues for this high-risk pregnancy and has Supervision of high risk pregnancy, antepartum; Scoliosis; GBS (group B streptococcus) UTI complicating pregnancy; Hemoglobin Constant Spring trait; Gestational diabetes mellitus (GDM); and Migraine on their problem list.  Patient reports occasional contractions.  Contractions: Irritability. Vag. Bleeding: None.  Movement: Present. Denies leaking of fluid.   The following portions of the patient's history were reviewed and updated as appropriate: allergies, current medications, past family history, past medical history, past social history, past surgical history and problem list.   Objective:   Vitals:   07/13/19 1426  BP: 127/79  Pulse: 98  Weight: 166 lb 8 oz (75.5 kg)    Fetal Status: Fetal Heart Rate (bpm): 151 Fundal Height: 38 cm Movement: Present  Presentation: Vertex  General:  Alert, oriented and cooperative. Patient is in no acute distress.  Skin: Skin is warm and dry. No rash noted.   Cardiovascular: Normal heart rate noted  Respiratory: Normal respiratory effort, no problems with respiration noted  Abdomen: Soft, gravid, appropriate for gestational age.  Pain/Pressure: Present     Pelvic: Cervical exam deferred Dilation: 3.5 Effacement (%): 80 Station: -3  Extremities: Normal range of motion.  Edema: Trace  Mental Status: Normal mood and affect. Normal behavior. Normal judgment and thought content.   Assessment and Plan:  Pregnancy: G1P0000 at [redacted]w[redacted]d 1. Supervision of high risk pregnancy, antepartum FHT and FH normal. Induce at 39 weeks (favorable cervix, GDM)  2. Diet controlled gestational diabetes mellitus (GDM) in third trimester Fairly well controlled  3. Group B Streptococcus urinary tract infection affecting pregnancy in second trimester PCN in labor  4.  PUPPP (pruritic urticarial papules and plaques of pregnancy)  Term labor symptoms and general obstetric precautions including but not limited to vaginal bleeding, contractions, leaking of fluid and fetal movement were reviewed in detail with the patient. Please refer to After Visit Summary for other counseling recommendations.   No follow-ups on file.  Future Appointments  Date Time Provider Department Center  07/16/2019 10:15 AM [facility]INIC[facility] WOC  07/18/2019  9:25 AM [facility] SCHED ROOM [facility] None    Levie Heritage, DO

## 2019-07-14 ENCOUNTER — Encounter (HOSPITAL_COMMUNITY): Payer: Self-pay | Admitting: *Deleted

## 2019-07-14 ENCOUNTER — Telehealth (HOSPITAL_COMMUNITY): Payer: Self-pay | Admitting: *Deleted

## 2019-07-14 NOTE — Telephone Encounter (Signed)
Preadmission screen  

## 2019-07-15 ENCOUNTER — Encounter (HOSPITAL_COMMUNITY): Payer: Self-pay | Admitting: Family Medicine

## 2019-07-15 ENCOUNTER — Inpatient Hospital Stay (HOSPITAL_COMMUNITY): Payer: Managed Care, Other (non HMO) | Admitting: Anesthesiology

## 2019-07-15 ENCOUNTER — Inpatient Hospital Stay (HOSPITAL_COMMUNITY)
Admission: AD | Admit: 2019-07-15 | Discharge: 2019-07-17 | DRG: 806 | Disposition: A | Discharge status: Home / Self Care | Payer: Managed Care, Other (non HMO) | Attending: Obstetrics and Gynecology | Admitting: Obstetrics and Gynecology

## 2019-07-15 ENCOUNTER — Other Ambulatory Visit: Payer: Self-pay

## 2019-07-15 DIAGNOSIS — O99824 Streptococcus B carrier state complicating childbirth: Secondary | ICD-10-CM | POA: Diagnosis present

## 2019-07-15 DIAGNOSIS — O24420 Gestational diabetes mellitus in childbirth, diet controlled: Secondary | ICD-10-CM | POA: Diagnosis present

## 2019-07-15 DIAGNOSIS — O1404 Mild to moderate pre-eclampsia, complicating childbirth: Secondary | ICD-10-CM | POA: Diagnosis present

## 2019-07-15 DIAGNOSIS — O4292 Full-term premature rupture of membranes, unspecified as to length of time between rupture and onset of labor: Secondary | ICD-10-CM | POA: Diagnosis present

## 2019-07-15 DIAGNOSIS — O864 Pyrexia of unknown origin following delivery: Secondary | ICD-10-CM | POA: Diagnosis not present

## 2019-07-15 DIAGNOSIS — Z20822 Contact with and (suspected) exposure to covid-19: Secondary | ICD-10-CM | POA: Diagnosis present

## 2019-07-15 DIAGNOSIS — O26893 Other specified pregnancy related conditions, third trimester: Secondary | ICD-10-CM | POA: Diagnosis present

## 2019-07-15 DIAGNOSIS — D563 Thalassemia minor: Secondary | ICD-10-CM

## 2019-07-15 DIAGNOSIS — Z3A38 38 weeks gestation of pregnancy: Secondary | ICD-10-CM | POA: Diagnosis not present

## 2019-07-15 DIAGNOSIS — Z3490 Encounter for supervision of normal pregnancy, unspecified, unspecified trimester: Secondary | ICD-10-CM | POA: Diagnosis present

## 2019-07-15 DIAGNOSIS — O0990 Supervision of high risk pregnancy, unspecified, unspecified trimester: Secondary | ICD-10-CM

## 2019-07-15 DIAGNOSIS — M419 Scoliosis, unspecified: Secondary | ICD-10-CM | POA: Diagnosis present

## 2019-07-15 DIAGNOSIS — O24410 Gestational diabetes mellitus in pregnancy, diet controlled: Secondary | ICD-10-CM

## 2019-07-15 DIAGNOSIS — O98813 Other maternal infectious and parasitic diseases complicating pregnancy, third trimester: Secondary | ICD-10-CM | POA: Diagnosis not present

## 2019-07-15 LAB — CBC
HCT: 41.2 % (ref 36.0–46.0)
HCT: 35.1 % — ABNORMAL LOW (ref 36.0–46.0)
Hemoglobin: 12.7 g/dL (ref 12.0–15.0)
Hemoglobin: 10.9 g/dL — ABNORMAL LOW (ref 12.0–15.0)
MCH: 24.3 pg — ABNORMAL LOW (ref 26.0–34.0)
MCH: 24.5 pg — ABNORMAL LOW (ref 26.0–34.0)
MCHC: 30.8 g/dL (ref 30.0–36.0)
MCHC: 31.1 g/dL (ref 30.0–36.0)
MCV: 78.8 fL — ABNORMAL LOW (ref 80.0–100.0)
MCV: 79.1 fL — ABNORMAL LOW (ref 80.0–100.0)
Platelets: 261 10*3/uL (ref 150–400)
Platelets: 219 10*3/uL (ref 150–400)
RBC: 5.23 MIL/uL — ABNORMAL HIGH (ref 3.87–5.11)
RBC: 4.44 MIL/uL (ref 3.87–5.11)
RDW: 16.8 % — ABNORMAL HIGH (ref 11.5–15.5)
RDW: 16.8 % — ABNORMAL HIGH (ref 11.5–15.5)
WBC: 11.5 10*3/uL — ABNORMAL HIGH (ref 4.0–10.5)
WBC: 21.4 10*3/uL — ABNORMAL HIGH (ref 4.0–10.5)
nRBC: 0 % (ref 0.0–0.2)
nRBC: 0 % (ref 0.0–0.2)

## 2019-07-15 LAB — COMPREHENSIVE METABOLIC PANEL
ALT: 16 U/L (ref 0–44)
AST: 26 U/L (ref 15–41)
Albumin: 3.1 g/dL — ABNORMAL LOW (ref 3.5–5.0)
Alkaline Phosphatase: 239 U/L — ABNORMAL HIGH (ref 38–126)
Anion gap: 11 (ref 5–15)
BUN: 13 mg/dL (ref 6–20)
CO2: 18 mmol/L — ABNORMAL LOW (ref 22–32)
Calcium: 9.9 mg/dL (ref 8.9–10.3)
Chloride: 107 mmol/L (ref 98–111)
Creatinine, Ser: 0.88 mg/dL (ref 0.44–1.00)
GFR calc Af Amer: >60 mL/min (ref >60)
GFR calc non Af Amer: >60 mL/min (ref >60)
Glucose, Bld: 78 mg/dL (ref 70–99)
Potassium: 4.2 mmol/L (ref 3.5–5.1)
Sodium: 136 mmol/L (ref 135–145)
Total Bilirubin: 0.2 mg/dL — ABNORMAL LOW (ref 0.3–1.2)
Total Protein: 7.1 g/dL (ref 6.5–8.1)

## 2019-07-15 LAB — RESPIRATORY PANEL BY RT PCR (FLU A&B, COVID)
Influenza A by PCR: NEGATIVE
Influenza B by PCR: NEGATIVE
SARS Coronavirus 2 by RT PCR: NEGATIVE

## 2019-07-15 LAB — GLUCOSE, CAPILLARY
Glucose-Capillary: 82 mg/dL (ref 70–99)
Glucose-Capillary: 71 mg/dL (ref 70–99)

## 2019-07-15 LAB — POCT FERN TEST: POCT Fern Test: POSITIVE

## 2019-07-15 LAB — PROTEIN / CREATININE RATIO, URINE
Creatinine, Urine: 148.05 mg/dL
Protein Creatinine Ratio: 0.4 mg/mg{Cre} — ABNORMAL HIGH (ref 0.00–0.15)
Total Protein, Urine: 59 mg/dL

## 2019-07-15 LAB — ABO/RH: ABO/RH(D): O POS

## 2019-07-15 LAB — RPR: RPR Ser Ql: NONREACTIVE

## 2019-07-15 LAB — TYPE AND SCREEN
ABO/RH(D): O POS
Antibody Screen: NEGATIVE

## 2019-07-15 MED ORDER — ONDANSETRON HCL 4 MG/2ML IJ SOLN: 4.0000 mg | INTRAMUSCULAR | Status: DC | PRN

## 2019-07-15 MED ORDER — SENNOSIDES-DOCUSATE SODIUM 8.6-50 MG PO TABS
2.0000 | ORAL_TABLET | ORAL | Status: DC
  Administered 2019-07-16 – 2019-07-17 (×2): 2 via ORAL
  Filled 2019-07-15 (×2): qty 2

## 2019-07-15 MED ORDER — LIDOCAINE HCL (PF) 1 % IJ SOLN: 30.0000 mL | INTRAMUSCULAR | Status: DC | PRN

## 2019-07-15 MED ORDER — DIPHENHYDRAMINE HCL 50 MG/ML IJ SOLN
12.5000 mg | INTRAMUSCULAR | Status: DC | PRN
  Filled 2019-07-15: qty 1

## 2019-07-15 MED ORDER — EPHEDRINE 5 MG/ML INJ: 10.0000 mg | INTRAVENOUS | Status: DC | PRN

## 2019-07-15 MED ORDER — ACETAMINOPHEN 325 MG PO TABS
650.0000 mg | ORAL_TABLET | ORAL | Status: DC | PRN
  Administered 2019-07-16: 650 mg via ORAL
  Filled 2019-07-15: qty 2

## 2019-07-15 MED ORDER — LACTATED RINGERS IV SOLN
500.0000 mL | INTRAVENOUS | Status: DC | PRN
  Administered 2019-07-15: 1000 mL via INTRAVENOUS

## 2019-07-15 MED ORDER — DIPHENHYDRAMINE HCL 25 MG PO CAPS: 25.0000 mg | ORAL_CAPSULE | Freq: Four times a day (QID) | ORAL | Status: DC | PRN

## 2019-07-15 MED ORDER — COCONUT OIL OIL
1.0000 "application " | TOPICAL_OIL | Status: DC | PRN
  Administered 2019-07-16: 1 via TOPICAL

## 2019-07-15 MED ORDER — TETANUS-DIPHTH-ACELL PERTUSSIS 5-2.5-18.5 LF-MCG/0.5 IM SUSP: 0.5000 mL | Freq: Once | INTRAMUSCULAR | Status: DC

## 2019-07-15 MED ORDER — BENZOCAINE-MENTHOL 20-0.5 % EX AERO: 1.0000 "application " | INHALATION_SPRAY | CUTANEOUS | Status: DC | PRN

## 2019-07-15 MED ORDER — FENTANYL CITRATE (PF) 100 MCG/2ML IJ SOLN
100.0000 ug | INTRAMUSCULAR | Status: DC | PRN
  Administered 2019-07-15 (×2): 100 ug via INTRAVENOUS
  Filled 2019-07-15 (×2): qty 2

## 2019-07-15 MED ORDER — IBUPROFEN 600 MG PO TABS
600.0000 mg | ORAL_TABLET | Freq: Four times a day (QID) | ORAL | Status: DC
  Administered 2019-07-16 – 2019-07-17 (×5): 600 mg via ORAL
  Filled 2019-07-15 (×6): qty 1

## 2019-07-15 MED ORDER — SODIUM CHLORIDE (PF) 0.9 % IJ SOLN
INTRAMUSCULAR | Status: DC | PRN
  Administered 2019-07-15: 12 mL/h via EPIDURAL

## 2019-07-15 MED ORDER — TERBUTALINE SULFATE 1 MG/ML IJ SOLN: 0.2500 mg | Freq: Once | INTRAMUSCULAR | Status: DC | PRN

## 2019-07-15 MED ORDER — DIPHENHYDRAMINE HCL 50 MG/ML IJ SOLN
25.0000 mg | Freq: Once | INTRAMUSCULAR | Status: AC
  Administered 2019-07-15: 25 mg via INTRAVENOUS

## 2019-07-15 MED ORDER — PENICILLIN G POT IN DEXTROSE 60000 UNIT/ML IV SOLN
3.0000 10*6.[IU] | INTRAVENOUS | Status: DC
  Administered 2019-07-15 (×2): 3 10*6.[IU] via INTRAVENOUS
  Filled 2019-07-15 (×2): qty 50

## 2019-07-15 MED ORDER — LACTATED RINGERS IV SOLN: 500.0000 mL | Freq: Once | INTRAVENOUS | Status: DC

## 2019-07-15 MED ORDER — ACETAMINOPHEN 325 MG PO TABS
650.0000 mg | ORAL_TABLET | ORAL | Status: DC | PRN
  Administered 2019-07-15: 650 mg via ORAL
  Filled 2019-07-15: qty 2

## 2019-07-15 MED ORDER — SIMETHICONE 80 MG PO CHEW: 80.0000 mg | CHEWABLE_TABLET | ORAL | Status: DC | PRN

## 2019-07-15 MED ORDER — PRENATAL MULTIVITAMIN CH
1.0000 | ORAL_TABLET | Freq: Every day | ORAL | Status: DC
  Administered 2019-07-16: 1 via ORAL
  Filled 2019-07-15: qty 1

## 2019-07-15 MED ORDER — DIBUCAINE (PERIANAL) 1 % EX OINT: 1.0000 "application " | TOPICAL_OINTMENT | CUTANEOUS | Status: DC | PRN

## 2019-07-15 MED ORDER — PHENYLEPHRINE 40 MCG/ML (10ML) SYRINGE FOR IV PUSH (FOR BLOOD PRESSURE SUPPORT): 80.0000 ug | PREFILLED_SYRINGE | INTRAVENOUS | Status: DC | PRN

## 2019-07-15 MED ORDER — TRANEXAMIC ACID-NACL 1000-0.7 MG/100ML-% IV SOLN: 1000.0000 mg | INTRAVENOUS | Status: AC

## 2019-07-15 MED ORDER — TRANEXAMIC ACID-NACL 1000-0.7 MG/100ML-% IV SOLN
INTRAVENOUS | Status: AC
  Administered 2019-07-15: 1000 mg via INTRAVENOUS
  Filled 2019-07-15: qty 100

## 2019-07-15 MED ORDER — SODIUM CHLORIDE 0.9 % IV SOLN
5.0000 10*6.[IU] | Freq: Once | INTRAVENOUS | Status: AC
  Administered 2019-07-15: 5 10*6.[IU] via INTRAVENOUS
  Filled 2019-07-15: qty 5

## 2019-07-15 MED ORDER — OXYTOCIN 40 UNITS IN NORMAL SALINE INFUSION - SIMPLE MED
1.0000 m[IU]/min | INTRAVENOUS | Status: DC
  Administered 2019-07-15: 2 m[IU]/min via INTRAVENOUS

## 2019-07-15 MED ORDER — ONDANSETRON HCL 4 MG/2ML IJ SOLN: 4.0000 mg | Freq: Four times a day (QID) | INTRAMUSCULAR | Status: DC | PRN

## 2019-07-15 MED ORDER — OXYTOCIN BOLUS FROM INFUSION
500.0000 mL | Freq: Once | INTRAVENOUS | Status: AC
  Administered 2019-07-15: 500 mL via INTRAVENOUS

## 2019-07-15 MED ORDER — WITCH HAZEL-GLYCERIN EX PADS: 1.0000 "application " | MEDICATED_PAD | CUTANEOUS | Status: DC | PRN

## 2019-07-15 MED ORDER — OXYCODONE-ACETAMINOPHEN 5-325 MG PO TABS: 1.0000 | ORAL_TABLET | ORAL | Status: DC | PRN

## 2019-07-15 MED ORDER — LACTATED RINGERS IV SOLN: INTRAVENOUS | Status: DC

## 2019-07-15 MED ORDER — ONDANSETRON HCL 4 MG PO TABS: 4.0000 mg | ORAL_TABLET | ORAL | Status: DC | PRN

## 2019-07-15 MED ORDER — OXYCODONE-ACETAMINOPHEN 5-325 MG PO TABS: 2.0000 | ORAL_TABLET | ORAL | Status: DC | PRN

## 2019-07-15 MED ORDER — FENTANYL-BUPIVACAINE-NACL 0.5-0.125-0.9 MG/250ML-% EP SOLN
12.0000 mL/h | EPIDURAL | Status: DC | PRN
  Filled 2019-07-15: qty 250

## 2019-07-15 MED ORDER — OXYTOCIN 40 UNITS IN NORMAL SALINE INFUSION - SIMPLE MED
1.0000 m[IU]/min | INTRAVENOUS | Status: DC
  Filled 2019-07-15: qty 1000

## 2019-07-15 MED ORDER — LIDOCAINE HCL (PF) 1 % IJ SOLN
INTRAMUSCULAR | Status: DC | PRN
  Administered 2019-07-15: 10 mL via EPIDURAL
  Administered 2019-07-15: 2 mL via EPIDURAL

## 2019-07-15 MED ORDER — OXYTOCIN 40 UNITS IN NORMAL SALINE INFUSION - SIMPLE MED
2.5000 [IU]/h | INTRAVENOUS | Status: DC
  Administered 2019-07-15: 2.5 [IU]/h via INTRAVENOUS

## 2019-07-15 MED ORDER — SOD CITRATE-CITRIC ACID 500-334 MG/5ML PO SOLN: 30.0000 mL | ORAL | Status: DC | PRN

## 2019-07-15 MED ORDER — MISOPROSTOL 200 MCG PO TABS
800.0000 ug | ORAL_TABLET | Freq: Once | ORAL | Status: AC
  Administered 2019-07-15: 800 ug via BUCCAL
  Filled 2019-07-15: qty 4

## 2019-07-15 NOTE — Anesthesia Procedure Notes (Signed)
Epidural Patient location during procedure: OB Start time: 07/15/2019 10:15 AM End time: 07/15/2019 10:33 AM  Staffing Anesthesiologist: Lannie Fields, DO Performed: anesthesiologist   Preanesthetic Checklist Completed: patient identified, IV checked, risks and benefits discussed, monitors and equipment checked, pre-op evaluation and timeout performed  Epidural Patient position: sitting Prep: DuraPrep and site prepped and draped Patient monitoring: continuous pulse ox, blood pressure, heart rate and cardiac monitor Approach: midline Location: L3-L4 Injection technique: LOR air  Needle:  Needle type: Tuohy  Needle gauge: 17 G Needle length: 9 cm Needle insertion depth: 4.5 cm Catheter type: closed end flexible Catheter size: 19 Gauge Catheter at skin depth: 10 cm Test dose: negative  Assessment Sensory level: T8 Events: blood not aspirated, injection not painful, no injection resistance, no paresthesia and negative IV test  Additional Notes Patient identified. Risks/Benefits/Options discussed with patient including but not limited to bleeding, infection, nerve damage, paralysis, failed block, incomplete pain control, headache, blood pressure changes, nausea, vomiting, reactions to medication both or allergic, itching and postpartum back pain. Confirmed with bedside nurse the patient's most recent platelet count. Confirmed with patient that they are not currently taking any anticoagulation, have any bleeding history or any family history of bleeding disorders. Patient expressed understanding and wished to proceed. All questions were answered. Sterile technique was used throughout the entire procedure. Please see nursing notes for vital signs. Test dose was given through epidural catheter and negative prior to continuing to dose epidural or start infusion. Warning signs of high block given to the patient including shortness of breath, tingling/numbness in hands, complete motor  block, or any concerning symptoms with instructions to call for help. Patient was given instructions on fall risk and not to get out of bed. All questions and concerns addressed with instructions to call with any issues or inadequate analgesia.  Reason for block:procedure for pain

## 2019-07-15 NOTE — MAU Note (Signed)
Patient reports SROM around 0420 of clear fluid w/ some bloody show.  Endorses + FM.  GDM.  GBS +.  CTX every 5-6 mins.

## 2019-07-15 NOTE — H&P (Signed)
OBSTETRIC ADMISSION HISTORY AND PHYSICAL  Terri Combs is a 24 y.o. female G1P0000 with IUP at 63w4dpresenting for SROM with clear fluid and SOL. She reports +FMs. No LOF, VB, blurry vision, headaches, peripheral edema, or RUQ pain. She plans on breastfeeding. She is undecided for birth control.  Dating: By first trimester [place]--->  Estimated Date of Delivery: 07/25/19  Sono:   _0 , normal anatomy, cephalic presentation, 21761Y 41%ile, 6#5   Prenatal History/Complications: GDMA1 GBS in urine Hemoglobin Constant Spring trait Scoliosis  Past Medical History: Past Medical History:  Diagnosis Date  . Gestational diabetes   . Migraine with aura   . PUPP (pruritic urticarial papules and plaques of pregnancy)   . Scoliosis   . Vaginal yeast infection     Past Surgical History: Past Surgical History:  Procedure Laterality Date  . NO PAST SURGERIES      Obstetrical History: OB History    Gravida  1   Para  0   Term  0   Preterm  0   AB  0   Living  0     SAB  0   TAB  0   Ectopic  0   Multiple  0   Live Births  0           Social History: Social History   Socioeconomic History  . Marital status: Single    Spouse name: Not on file  . Number of children: 0  . Years of education: 145 . Highest education level: Not on file  Occupational History  . Occupation: [employer]  Tobacco Use  . Smoking status: Never Smoker  . Smokeless tobacco: Never Used  Substance and Sexual Activity  . Alcohol use: No  . Drug use: No  . Sexual activity: Yes    Partners: Male    Birth control/protection: None  Other Topics Concern  . Not on file  Social History Narrative   Lives at home w/ her parents   Right-handed   Caffeine: soda daily   Social Determinants of Health   Financial Resource Strain:   . Difficulty of Paying Living Expenses:   Food Insecurity: Food Insecurity Present  . Worried About RCharity fundraiserin the Last Year: Often true  . Ran Out of Food  in the Last Year: Never true  Transportation Needs: No Transportation Needs  . Lack of Transportation (Medical): No  . Lack of Transportation (Non-Medical): No  Physical Activity:   . Days of Exercise per Week:   . Minutes of Exercise per Session:   Stress:   . Feeling of Stress :   Social Connections:   . Frequency of Communication with Friends and Family:   . Frequency of Social Gatherings with Friends and Family:   . Attends Religious Services:   . Active Member of Clubs or Organizations:   . Attends CArchivistMeetings:   .Marland KitchenMarital Status:     Family History: Family History  Problem Relation Age of Onset  . Migraines Mother   . Diabetes Mother   . Arthritis Mother   . Hypotension Father     Allergies: No Known Allergies  Medications Prior to Admission  Medication Sig Dispense Refill Last Dose  . Accu-Chek FastClix Lancets MISC 1 Device by Percutaneous route 4 (four) times daily. 100 each 12   . Blood Glucose Monitoring Suppl (ACCU-CHEK GUIDE) w/Device KIT 1 kit by Subdermal route 4 (four) times daily. Check blood sugars for fasting,  and two hours after breakfast, lunch and dinner (4 checks daily) 1 kit 0   . Blood Pressure Monitoring (BLOOD PRESSURE KIT) DEVI 1 Device by Does not apply route as needed. ICD 10:  O09.90 1 Device 0   . glucose blood test strip Use as instructed 100 each 12   . hydrocortisone 1 % lotion Apply 1 application topically 2 (two) times daily. (Patient not taking: Reported on 06/19/2019) 118 mL 1   . metoCLOPramide (REGLAN) 5 MG tablet Take 1 tablet (5 mg total) by mouth every 6 (six) hours as needed for nausea. 20 tablet 0   . Prenatal MV-Min-FA-Omega-3 (PRENATAL GUMMIES/DHA & FA PO) Take 2 tablets by mouth daily.     Marland Kitchen terconazole (TERAZOL 7) 0.4 % vaginal cream Place 1 applicator vaginally at bedtime. (Patient not taking: Reported on 07/01/2019) 45 g 0      Review of Systems:  All systems reviewed and negative except as stated in  HPI  PE: Blood pressure (!) 150/99, pulse 100, temperature 98 F (36.7 C), resp. rate 17, weight 76.3 kg, last menstrual period 09/28/2018. General appearance: alert and cooperative Lungs: regular rate and effort Heart: regular rate  Abdomen: soft, non-tender Extremities: Homans sign is negative, no sign of DVT Presentation: cephalic EFM: 283 bpm, moderate variability, 15x15 accels, no decels Toco: CTX q5-6 minutes Dilation: 3.5 Effacement (%): 90 Station: 0 Exam by:: Esau Grew, RN  Prenatal labs: ABO, Rh: O/Positive/-- (09/24 1023) Antibody: Negative (09/24 1023) Rubella: 1.03 (09/24 1023) RPR: Non Reactive (01/13 0911)  HBsAg: Negative (09/24 1023)  HIV: Non Reactive (01/13 0911)  GBS:   Positive 2 hr GTT -abnormal  Prenatal Transfer Tool  Maternal Diabetes: Yes:  Diabetes Type:  Diet controlled Genetic Screening: Normal Maternal Ultrasounds/Referrals: Normal Fetal Ultrasounds or other Referrals:  Referred to Materal Fetal Medicine  Maternal Substance Abuse:  No Significant Maternal Medications:  None Significant Maternal Lab Results: Group B Strep positive  Results for orders placed or performed during the hospital encounter of 07/15/19 (from the past 24 hour(s))  Fern Test   Collection Time: 07/15/19  5:40 AM  Result Value Ref Range   POCT Fern Test Positive = ruptured amniotic membanes     Patient Active Problem List   Diagnosis Date Noted  . Encounter for induction of labor 07/15/2019  . Migraine 06/08/2019  . Gestational diabetes mellitus (GDM) 05/02/2019  . Hemoglobin Constant Spring trait 02/13/2019  . GBS (group B streptococcus) UTI complicating pregnancy 15/17/6160  . Supervision of high risk pregnancy, antepartum 01/01/2019  . Scoliosis     Assessment: Terri Combs is a 24 y.o. G1P0000 at 28w4dhere for SROM  1. Labor: expectant management for now. Consider Pitocin for augmentation if needed. 2. FWB: Cat I, EFW 7#3 3. Pain: per patient request 4.  GBS: positive in urine, PCN   Plan: Admit to Labor and Delivery  Anticipate NSVD  [place] DO  07/15/2019, 5:51 AM

## 2019-07-15 NOTE — Progress Notes (Addendum)
Patient ID: Terri Combs, female   DOB: [birth year], 24 y.o.   MRN: [id number] Feeling better Less chills but still feels hot Got bolus.  Temp remains elevated post Cytotec .  Vitals:   07/15/19 2020 07/15/19 2045 07/15/19 2100 07/15/19 2115  BP:  (!) 164/72 (!) 161/71 133/73  Pulse:  (!) 146 (!) 137 (!) 128  Resp:  18 18 18   Temp: (!) 103.2 F (39.6 C)     TempSrc: Axillary     SpO2:      Weight:      Height:       Discussed with Dr Will give one more liter of fluids then transfer He states she has no risk factors for sepsis, and given large dose of buccal Cytotec, this is likely a side effect of that  Will watch temp overnight Vergie Living, CNM

## 2019-07-15 NOTE — Progress Notes (Signed)
Patient ID: Terri Combs, female   DOB: January 16, 1996, 24 y.o.   MRN: [id number] Called to see patient with complaints of fever, tachycardia, and intermittent shortness of breath. Vitals:   07/15/19 1915 07/15/19 1930 07/15/19 2019 07/15/19 2020  BP: (!) 143/94 (!) 139/94 (!) 161/86   Pulse: (!) 102 (!) 107 (!) 138   Resp: 18 18    Temp:   (!) 102.6 F (39.2 C) (!) 103.2 F (39.6 C)  TempSrc:   Oral Axillary  SpO2:      Weight:      Height:       Abdomen soft and nontender Uterus fundus firm below umbilicus Lochia moderate  Discussed with Dr Vergie Living May be response to Cytotec  Will give bolus, Benadryl and Tylenol WIll recheck CBC  Aviva Signs, CNM

## 2019-07-15 NOTE — Anesthesia Preprocedure Evaluation (Addendum)
Anesthesia Evaluation  Patient identified by MRN, date of birth, ID band Patient awake    Reviewed: Allergy & Precautions, Patient's Chart, lab work & pertinent test results  Airway Mallampati: II  TM Distance: >3 FB Neck ROM: Full    Dental no notable dental hx. (+) Teeth Intact   Pulmonary neg pulmonary ROS,    Pulmonary exam normal breath sounds clear to auscultation       Cardiovascular negative cardio ROS Normal cardiovascular exam Rhythm:Regular Rate:Normal     Neuro/Psych negative neurological ROS  negative psych ROS   GI/Hepatic negative GI ROS, Neg liver ROS,   Endo/Other  diabetes, Gestational  Renal/GU negative Renal ROS  negative genitourinary   Musculoskeletal Scoliosis    Abdominal   Peds negative pediatric ROS (+)  Hematology negative hematology ROS (+) hct 41.2, plt 261   Anesthesia Other Findings   Reproductive/Obstetrics (+) Pregnancy Pruritic urticarial papules and plaques of pregnancy                            Anesthesia Physical Anesthesia Plan  ASA: II and emergent  Anesthesia Plan: Epidural   Post-op Pain Management:    Induction:   PONV Risk Score and Plan: 2  Airway Management Planned: Natural Airway  Additional Equipment: None  Intra-op Plan:   Post-operative Plan:   Informed Consent: I have reviewed the patients History and Physical, chart, labs and discussed the procedure including the risks, benefits and alternatives for the proposed anesthesia with the patient or authorized representative who has indicated his/her understanding and acceptance.       Plan Discussed with:   Anesthesia Plan Comments:         Anesthesia Quick Evaluation

## 2019-07-15 NOTE — Progress Notes (Addendum)
Labor Progress Note Terri Combs is a 24 y.o. G1P0000 at [redacted]w[redacted]d presented for SROM with clear fluid and SOL. S:  Feeling some pressure but no complaints at this time. O:  BP (!) 137/95   Pulse (!) 106   Temp 98.9 F (37.2 C) (Oral)   Resp 18   Ht 5' 1.75" (1.568 m)   Wt 75.3 kg   LMP 09/28/2018 Comment: per patient irregular periods  SpO2 99%   BMI 30.61 kg/m  EFM: 140/moderate/acels  CVE: Dilation: 3.5 Effacement (%): 90 Cervical Position: Anterior Station: 0 Presentation: Vertex Exam by:: Latricia Heft, RN   A&P: 24 y.o. G1P0000 [redacted]w[redacted]d for SROM and SOL #Labor: Expectant management, can consider pitocin on next check. #Pain: Epidural #FWB: Cat I #GBS positive, PCN #gHTN: Pre-E labs pending. No severe range pressures.  Jackelyn Poling, DO 12:01 PM

## 2019-07-15 NOTE — Discharge Summary (Addendum)
Postpartum Discharge Summary    Patient Name: Terri Combs DOB: 1996/03/09 MRN: [id number]  Date of admission: 07/15/2019 Delivering Provider: Marcille Buffy D   Date of discharge: 07/17/2019  Admitting diagnosis: Encounter for induction of labor [Z34.90] Intrauterine pregnancy: [redacted]w[redacted]d    Secondary diagnosis:  Principal Problem:   Vaginal delivery Active Problems:   Encounter for induction of labor  Additional problems: None     Discharge diagnosis: Term Pregnancy Delivered and Preeclampsia (mild)                                                                                                Post partum procedures:None  Augmentation: Pitocin  Complications: None  Hospital course:  Onset of Labor With Vaginal Delivery     24y.o. yo G1P0000 at 329w4das admitted in Latent Labor on 07/15/2019. Patient had an uncomplicated labor course as follows:  Membrane Rupture Time/Date: 4:20 AM ,07/15/2019   Intrapartum Procedures: Episiotomy: None [1]                                         Lacerations:  None [1]  Patient had a delivery of a Viable infant. 07/15/2019  Information for the patient's newborn:  MaHonesty, Menta0[[id number]]Delivery Method: [place] an uncomplicated postpartum course.  She is ambulating, tolerating a regular diet, passing flatus, and urinating well. Patient is discharged home in stable condition on 07/17/19.  Delivery time: 5:57 PM    Magnesium Sulfate received: No BMZ received: No Rhophylac:No MMR:No Transfusion:No  Physical exam  Vitals:   07/16/19 0828 07/16/19 1220 07/16/19 2100 07/17/19 0500  BP: 115/80 121/90 (!) 144/88 129/87  Pulse: 83 86 73 79  Resp:  _0 Temp: 98.4 F (36.9 C) 98 F (36.7 C) 98.1 F (36.7 C) 97.7 F (36.5 C)  TempSrc: Oral Oral Oral Oral  SpO2: 98% 98% 98% 99%  Weight:      Height:       General: alert, cooperative and no distress  Chest: HRRR, Lungs CTA Lochia: appropriate Uterine Fundus: firm at  U/-1 Incision: N/A DVT Evaluation: No cords or calf tenderness. Labs: Lab Results  Component Value Date   WBC 16.7 (H) 07/16/2019   HGB 9.0 (L) 07/16/2019   HCT 28.5 (L) 07/16/2019   MCV 77.2 (L) 07/16/2019   PLT 186 07/16/2019   CMP Latest Ref Rng & Units 07/15/2019  Glucose 70 - 99 mg/dL 78  BUN 6 - 20 mg/dL 13  Creatinine 0.44 - 1.00 mg/dL 0.88  Sodium 135 - 145 mmol/L 136  Potassium 3.5 - 5.1 mmol/L 4.2  Chloride 98 - 111 mmol/L 107  CO2 22 - 32 mmol/L 18(L)  Calcium 8.9 - 10.3 mg/dL 9.9  Total Protein 6.5 - 8.1 g/dL 7.1  Total Bilirubin 0.3 - 1.2 mg/dL 0.2(L)  Alkaline Phos 38 - 126 U/L 239(H)  AST 15 - 41 U/L 26  ALT 0 - 44 U/L 16  Edinburgh Score: Edinburgh Postnatal Depression Scale Screening Tool 07/16/2019  I have been able to laugh and see the funny side of things. 0  I have looked forward with enjoyment to things. 0  I have blamed myself unnecessarily when things went wrong. 2  I have been anxious or worried for no good reason. 0  I have felt scared or panicky for no good reason. 0  Things have been getting on top of me. 0  I have been so unhappy that I have had difficulty sleeping. 0  I have felt sad or miserable. 2  I have been so unhappy that I have been crying. 0  The thought of harming myself has occurred to me. 0  Edinburgh Postnatal Depression Scale Total 4    Discharge instruction: per After Visit Summary and "Baby and Me Booklet".  After visit meds:  Allergies as of 07/17/2019   No Known Allergies     Medication List    STOP taking these medications   Accu-Chek FastClix Lancets Misc   Accu-Chek Guide w/Device Kit   glucose blood test strip   hydrocortisone 1 % lotion   metoCLOPramide 5 MG tablet Commonly known as: Reglan   terconazole 0.4 % vaginal cream Commonly known as: TERAZOL 7     TAKE these medications   Blood Pressure Kit Devi 1 Device by Does not apply route as needed. ICD 10:  O09.90   ferrous sulfate 325 (65 FE) MG  tablet Take 1 tablet (325 mg total) by mouth 2 (two) times daily with a meal. For 14 days, then once daily for 28 days.   ibuprofen 600 MG tablet Commonly known as: ADVIL Take 1 tablet (600 mg total) by mouth every 6 (six) hours.   PRENATAL GUMMIES/DHA & FA PO Take 2 tablets by mouth daily.       Diet: routine diet  Activity: Advance as tolerated. Pelvic rest for 6 weeks.   Patient instructed to take blood pressure daily and parameters given for concern.  Plan for follow up in one week for bp check.   Outpatient follow up:6 weeks Follow up Appt:No future appointments. Follow up Visit: Please schedule this patient for Postpartum visit in: 1 week with the following provider: RN In-Person For C/S patients schedule nurse incision check in weeks 2 weeks: no Low risk pregnancy complicated by: pre-eclampsia  Delivery mode:  SVD Anticipated Birth Control:  other/unsure PP Procedures needed: BP check  Schedule Integrated BH visit: no   Newborn Data: Live born female-Alexander Birth Weight:  63 APGAR:8 ,9   Newborn Delivery   Birth date/time: 07/15/2019 17:57:00 Delivery type:       Baby Feeding: Breast Disposition:home with mother

## 2019-07-15 NOTE — Progress Notes (Signed)
Labor Progress Note Terri Combs is a 24 y.o. G1P0000 at 106w4d presented for SROM and SOL.  O:  BP 117/69   Pulse 97   Temp 98.9 F (37.2 C) (Oral)   Resp 18   Ht 5' 1.75" (1.568 m)   Wt 75.3 kg   LMP 09/28/2018 Comment: per patient irregular periods  SpO2 99%   BMI 30.61 kg/m  EFM: 130/moderate/acels present  CVE: Dilation: 10 Dilation Complete Date: 07/15/19 Dilation Complete Time: 1700 Effacement (%): 90 Cervical Position: Anterior Station: Plus 2 Presentation: Vertex Exam by:: Cher Nakai RNC   A&P: 24 y.o. G1P0000 [redacted]w[redacted]d  presented for SROM and SOL. #Labor: Continuing on pitocin. #Pain: Epidural #FWB: Cat I #GBS positive - PCN #PreE: Elevated Pro:Cr of 0.40. no severe features, no severe range pressures.   Jackelyn Poling, DO 5:03 PM

## 2019-07-16 ENCOUNTER — Encounter (HOSPITAL_COMMUNITY): Payer: Self-pay | Admitting: Family Medicine

## 2019-07-16 ENCOUNTER — Other Ambulatory Visit (HOSPITAL_COMMUNITY): Payer: Managed Care, Other (non HMO)

## 2019-07-16 LAB — CBC
HCT: 28.5 % — ABNORMAL LOW (ref 36.0–46.0)
Hemoglobin: 9 g/dL — ABNORMAL LOW (ref 12.0–15.0)
MCH: 24.4 pg — ABNORMAL LOW (ref 26.0–34.0)
MCHC: 31.6 g/dL (ref 30.0–36.0)
MCV: 77.2 fL — ABNORMAL LOW (ref 80.0–100.0)
Platelets: 186 10*3/uL (ref 150–400)
RBC: 3.69 MIL/uL — ABNORMAL LOW (ref 3.87–5.11)
RDW: 16.8 % — ABNORMAL HIGH (ref 11.5–15.5)
WBC: 16.7 10*3/uL — ABNORMAL HIGH (ref 4.0–10.5)
nRBC: 0 % (ref 0.0–0.2)

## 2019-07-16 MED ORDER — FERROUS SULFATE 325 (65 FE) MG PO TABS
325.0000 mg | ORAL_TABLET | Freq: Every day | ORAL | Status: DC
  Administered 2019-07-17: 325 mg via ORAL
  Filled 2019-07-16: qty 1

## 2019-07-16 MED ORDER — MEDROXYPROGESTERONE ACETATE 150 MG/ML IM SUSP
150.0000 mg | Freq: Once | INTRAMUSCULAR | Status: AC
  Administered 2019-07-16: 150 mg via INTRAMUSCULAR
  Filled 2019-07-16: qty 1

## 2019-07-16 NOTE — Lactation Note (Signed)
This note was copied from a baby's chart. Lactation Consultation Note Attempted to see mom, but mom sleeping.  Patient Name: Terri Combs [id number] Date: 07/16/2019     Maternal Data    Feeding Feeding Type: Breast Fed  LATCH Score                   Interventions    Lactation Tools Discussed/Used     Consult Status      [name] 07/16/2019, 4:00 AM

## 2019-07-16 NOTE — Lactation Note (Addendum)
This note was copied from a baby's chart. Lactation Consultation Note  Patient Name: Terri Combs [id number] Date: 07/16/2019 Reason for consult: Initial assessment;Primapara;1st time breastfeeding;Early term 37-38.6wks;Infant weight loss  Baby is 37 hours old  Baby had not fed since 10 am  For 30 mins  LC recommended for to place baby STS and check diaper.  Wet diaper changed , baby placed STS and deep latch obtained with increased  Swallows. LC reviewed breast feeding basics and mom reports + breast changes.  Mom active [facility] baby awake and rooting after the 2nd breast and LC assisted with positioning and baby latched easily.  Breast fed both breast with increased swallows.  Per mom will need a hand pump when D/C .  LC provided the hand out with phone numbers and information for potential feeding behaviors due to the baby being an early term infant.  LC stressed the importance of STS feedings every feeding until the baby is back to birth weight, gaining steadily and can stay awake for majority of feeding.  LC also discussed the baby's sleep wake patterns with feedings and its important to feed with feeding cues and if the baby has not shown feeding cues in awhile to check diaper and place baby STS, hand express, spoon feed.  LC reviewed the reasons why to wait for the use of a pacifier. Parents brought one from home.  LC provided report to the [facility] of findings for [facility] consult.    Maternal Data Has patient been taught Hand Expression?: Yes Does the patient have breastfeeding experience prior to this delivery?: No  Feeding Feeding Type: Breast Fed  LATCH Score Latch: Grasps breast easily, tongue down, lips flanged, rhythmical sucking.  Audible Swallowing: Spontaneous and intermittent  Type of Nipple: Everted at rest and after stimulation  Comfort (Breast/Nipple): Soft / non-tender  Hold (Positioning): Assistance needed to correctly position infant at breast and maintain  latch.  LATCH Score: 9  Interventions Interventions: Breast feeding basics reviewed;Assisted with latch;Skin to skin;Breast massage;Hand express;Breast compression;Adjust position;Support pillows;Position options  Lactation Tools Discussed/Used WIC Program: Yes   Consult Status Consult Status: Follow-up Date: 07/17/19 Follow-up type: In-patient    Ciera Beckum Shean Gerding 07/16/2019, 3:10 PM

## 2019-07-16 NOTE — Progress Notes (Signed)
Post Partum Day 1 Subjective: Terri Combs is a 23yo G1P1001 on PPD#1 after SROM/SOL with SVD. Patient had fevers, shakes, nausea, and vomitting after cytotec given post partum for bleeding. Reports she started feeling better around 2300 last night. Denies cough, dysuria. no complaints, up ad lib, voiding, tolerating PO and + flatus  Objective: Blood pressure 115/80, pulse 83, temperature 98.4 F (36.9 C), temperature source Oral, resp. rate 16, height 5' 1.75" (1.568 m), weight 75.3 kg, last menstrual period 09/28/2018, SpO2 98 %, unknown if currently breastfeeding.  Physical Exam:  BP 114/76 at 0420.  General: alert, cooperative and no distress Lochia: appropriate Uterine Fundus: firm DVT Evaluation: No evidence of DVT seen on physical exam.No significant calf/ankle edema. Posterior tibial pulses present bilaterally.   Recent Labs    07/15/19 1937 07/16/19 0506  HGB 10.9* 9.0*  HCT 35.1* 28.5*    Assessment/Plan: Terri Combs is a 23yo G1P1 on PPD#1 after SVD.  Continue routine postpartum care Breast feeding going well. Contraception: Depo given this AM Fever over night; like from Cytotec. Will continue to monitor; currently afebrile and denies other symptoms Pre-E: BP's well within normal range, no need for anti-hypertensive currently, will continue to monitor. BP check after discharge.  PO iron initiated    LOS: 1 day   Joselyn Arrow, MD 07/16/2019, 11:29 AM

## 2019-07-16 NOTE — H&P (Deleted)
Post Partum Day 1 Subjective: Terri Combs is a 23yo G1P1001 on PPD#1 after SROM/SOL with SVD. Patient had fevers, shakes, nausea, and vomitting after cytotec given post partum for bleeding. Reports she started feeling better around 2300 last night. no complaints, up ad lib, voiding, tolerating PO and + flatus  Objective: Blood pressure 114/76, pulse 98, temperature 98.5 F (36.9 C), temperature source Oral, resp. rate 16, height 5' 1.75" (1.568 m), weight 75.3 kg, last menstrual period 09/28/2018, SpO2 97 %, unknown if currently breastfeeding.  Physical Exam:  BP 114/76 at 0420.  General: alert, cooperative and no distress Lochia: appropriate Uterine Fundus: firm DVT Evaluation: No evidence of DVT seen on physical exam.No significant calf/ankle edema. Posterior tibial pulses present bilaterally.   Recent Labs    07/15/19 1937 07/16/19 0506  HGB 10.9* 9.0*  HCT 35.1* 28.5*    Assessment/Plan: Terri Combs is a 23yo G1P1 on PPD#1 after SVD.  Continue routine postpartum care. Discharge home today if okay with peds. Breast feeding going well. Contraception: needs depo orders and shot   LOS: 1 day   Lilyan Gilford, Medical Student 07/16/2019, 7:52 AM

## 2019-07-16 NOTE — Anesthesia Postprocedure Evaluation (Signed)
Anesthesia Post Note  Patient: Terri Combs  Procedure(s) Performed: AN AD HOC LABOR EPIDURAL     Patient location during evaluation: Mother Baby Anesthesia Type: Epidural Level of consciousness: awake Pain management: satisfactory to patient Vital Signs Assessment: post-procedure vital signs reviewed and stable Respiratory status: spontaneous breathing Cardiovascular status: stable Anesthetic complications: no    Last Vitals:  Vitals:   07/16/19 0010 07/16/19 0420  BP: 121/79 114/76  Pulse: (!) 111 98  Resp: 18 16  Temp: (!) 38.2 C 36.9 C  SpO2: 97% 97%    Last Pain:  Vitals:   07/16/19 0420  TempSrc: Oral  PainSc: 0-No pain   Pain Goal:                   [employer]

## 2019-07-17 MED ORDER — FERROUS SULFATE 325 (65 FE) MG PO TABS: 325.0000 mg | ORAL_TABLET | Freq: Two times a day (BID) | ORAL | 0 refills | Status: DC

## 2019-07-17 MED ORDER — IBUPROFEN 600 MG PO TABS: 600.0000 mg | ORAL_TABLET | Freq: Four times a day (QID) | ORAL | 0 refills | Status: DC

## 2019-07-17 NOTE — Lactation Note (Signed)
This note was copied from a baby's chart. Lactation Consultation Note  Patient Name: Terri Combs [id number]'[id number] Date: 07/17/2019 Reason for consult: Follow-up assessment;Primapara;1st time breastfeeding;Infant weight loss;Early term 37-38.6wks;Nipple pain/trauma Baby is 78 hours old  Baby awake and LC assisted to latch and depth achieved on the right breast/ football . Increased swallows noted / increased with breast compressions.  Per mom comfortable.  Per mom sore nipples have improved today.  Sore nipples and engorgement prevention and tx  LC reviewed and instructed mom on the use of the hand pump and shells.  Provided a #27 F for when the milk comes in.  [facility] discussed the importance of watching the baby for nutritive vs non - nutritive feeding patterns and not hanging out latched. Importance of STS feedings until the baby is back to birth weight , gaining steadily and can stay awake for majority of feeding.  Mom aware of  the [facility] resources after D/C .    Maternal Data Has patient been taught Hand Expression?: Yes  Feeding Feeding Type: Breast Fed  LATCH Score Latch: Grasps breast easily, tongue down, lips flanged, rhythmical sucking.  Audible Swallowing: Spontaneous and intermittent  Type of Nipple: Everted at rest and after stimulation  Comfort (Breast/Nipple): Soft / non-tender  Hold (Positioning): Assistance needed to correctly position infant at breast and maintain latch.  LATCH Score: 9  Interventions Interventions: Breast feeding basics reviewed;Assisted with latch;Skin to skin;Breast massage;Hand express;Breast compression;Adjust position;Support pillows;Position options;Shells;Comfort gels;Hand pump  Lactation Tools Discussed/Used Tools: Shells;Pump;Flanges;Comfort gels Flange Size: 24;27 Shell Type: Inverted Breast pump type: Manual WIC Program: Yes Pump Review: Milk Storage;Setup, frequency, and cleaning Initiated by:: MAI Date initiated:: 07/17/19   Consult  Status Consult Status: Complete Date: 07/17/19    Marvel Sapp 07/17/2019, 8:53 AM

## 2019-07-17 NOTE — Discharge Instructions (Signed)
How to Take Your Blood Pressure Blood pressure is a measurement of how strongly your blood is pressing against the walls of your arteries. Arteries are blood vessels that carry blood from your heart throughout your body. Your health care provider takes your blood pressure at each office visit. You can also take your own blood pressure at home with a blood pressure machine. You may need to take your own blood pressure:  To confirm a diagnosis of high blood pressure (hypertension).  To monitor your blood pressure over time.  To make sure your blood pressure medicine is working. Supplies needed: To take your blood pressure, you will need a blood pressure machine. You can buy a blood pressure machine, or blood pressure monitor, at most drugstores or online. There are several types of home blood pressure monitors. When choosing one, consider the following:  Choose a monitor that has an arm cuff.  Choose a cuff that wraps snugly around your upper arm. You should be able to fit only one finger between your arm and the cuff.  Do not choose a monitor that measures your blood pressure from your wrist or finger. Your health care provider can suggest a reliable monitor that will meet your needs. How to prepare To get the most accurate reading, avoid the following for 30 minutes before you check your blood pressure:  Drinking caffeine.  Drinking alcohol.  Eating.  Smoking.  Exercising. Five minutes before you check your blood pressure:  Empty your bladder.  Sit quietly without talking in a dining chair, rather than in a soft couch or armchair. How to take your blood pressure To check your blood pressure, follow the instructions in the manual that came with your blood pressure monitor. If you have a digital blood pressure monitor, the instructions may be as follows: 1. Sit up straight. 2. Place your feet on the floor. Do not cross your ankles or legs. 3. Rest your left arm at the level of  your heart on a table or desk or on the arm of a chair. 4. Pull up your shirt sleeve. 5. Wrap the blood pressure cuff around the upper part of your left arm, 1 inch (2.5 cm) above your elbow. It is best to wrap the cuff around bare skin. 6. Fit the cuff snugly around your arm. You should be able to place only one finger between the cuff and your arm. 7. Position the cord inside the groove of your elbow. 8. Press the power button. 9. Sit quietly while the cuff inflates and deflates. 10. Read the digital reading on the monitor screen and write it down (record it). 11. Wait 2-3 minutes, then repeat the steps, starting at step 1. What does my blood pressure reading mean? A blood pressure reading consists of a higher number over a lower number. Ideally, your blood pressure should be below 120/80. The first ("top") number is called the systolic pressure. It is a measure of the pressure in your arteries as your heart beats. The second ("bottom") number is called the diastolic pressure. It is a measure of the pressure in your arteries as the heart relaxes. Blood pressure is classified into four stages. The following are the stages for adults who do not have a short-term serious illness or a chronic condition. Systolic pressure and diastolic pressure are measured in a unit called mm Hg. Normal  Systolic pressure: below 120.  Diastolic pressure: below 80. Elevated  Systolic pressure: 120-129.  Diastolic pressure: below 80. Hypertension stage  1  Systolic pressure: 130-139.  Diastolic pressure: 80-89. Hypertension stage 2  Systolic pressure: 140 or above.  Diastolic pressure: 90 or above. You can have prehypertension or hypertension even if only the systolic or only the diastolic number in your reading is higher than normal. Follow these instructions at home:  Check your blood pressure as often as recommended by your health care provider.  Take your monitor to the next appointment with your  health care provider to make sure: ? That you are using it correctly. ? That it provides accurate readings.  Be sure you understand what your goal blood pressure numbers are.  Tell your health care provider if you are having any side effects from blood pressure medicine. Contact a health care provider if:  Your blood pressure is consistently high. Get help right away if:  Your systolic blood pressure is higher than 180.  Your diastolic blood pressure is higher than 110. This information is not intended to replace advice given to you by your health care provider. Make sure you discuss any questions you have with your health care provider. Document Revised: 03/15/2017 Document Reviewed: 09/09/2015 Elsevier Patient Education  2020 [employer]   Postpartum Care After Vaginal Delivery This sheet gives you information about how to care for yourself from the time you deliver your baby to up to 6-12 weeks after delivery (postpartum period). Your health care provider may also give you more specific instructions. If you have problems or questions, contact your health care provider. Follow these instructions at home: Vaginal bleeding  It is normal to have vaginal bleeding (lochia) after delivery. Wear a sanitary pad for vaginal bleeding and discharge. ? During the first week after delivery, the amount and appearance of lochia is often similar to a menstrual period. ? Over the next few weeks, it will gradually decrease to a dry, yellow-brown discharge. ? For most women, lochia stops completely by 4-6 weeks after delivery. Vaginal bleeding can vary from woman to woman.  Change your sanitary pads frequently. Watch for any changes in your flow, such as: ? A sudden increase in volume. ? A change in color. ? Large blood clots.  If you pass a blood clot from your vagina, save it and call your health care provider to discuss. Do not flush blood clots down the toilet before talking with your health  care provider.  Do not use tampons or douches until your health care provider says this is safe.  If you are not breastfeeding, your period should return 6-8 weeks after delivery. If you are feeding your child breast milk only (exclusive breastfeeding), your period may not return until you stop breastfeeding. Perineal care  Keep the area between the vagina and the anus (perineum) clean and dry as told by your health care provider. Use medicated pads and pain-relieving sprays and creams as directed.  If you had a cut in the perineum (episiotomy) or a tear in the vagina, check the area for signs of infection until you are healed. Check for: ? More redness, swelling, or pain. ? Fluid or blood coming from the cut or tear. ? Warmth. ? Pus or a bad smell.  You may be given a squirt bottle to use instead of wiping to clean the perineum area after you go to the bathroom. As you start healing, you may use the squirt bottle before wiping yourself. Make sure to wipe gently.  To relieve pain caused by an episiotomy, a tear in the vagina, or swollen  veins in the anus (hemorrhoids), try taking a warm sitz bath 2-3 times a day. A sitz bath is a warm water bath that is taken while you are sitting down. The water should only come up to your hips and should cover your buttocks. Breast care  Within the first few days after delivery, your breasts may feel heavy, full, and uncomfortable (breast engorgement). Milk may also leak from your breasts. Your health care provider can suggest ways to help relieve the discomfort. Breast engorgement should go away within a few days.  If you are breastfeeding: ? Wear a bra that supports your breasts and fits you well. ? Keep your nipples clean and dry. Apply creams and ointments as told by your health care provider. ? You may need to use breast pads to absorb milk that leaks from your breasts. ? You may have uterine contractions every time you breastfeed for up to several  weeks after delivery. Uterine contractions help your uterus return to its normal size. ? If you have any problems with breastfeeding, work with your health care provider or Advertising copywriter.  If you are not breastfeeding: ? Avoid touching your breasts a lot. Doing this can make your breasts produce more milk. ? Wear a good-fitting bra and use cold packs to help with swelling. ? Do not squeeze out (express) milk. This causes you to make more milk. Intimacy and sexuality  Ask your health care provider when you can engage in sexual activity. This may depend on: ? Your risk of infection. ? How fast you are healing. ? Your comfort and desire to engage in sexual activity.  You are able to get pregnant after delivery, even if you have not had your period. If desired, talk with your health care provider about methods of birth control (contraception). Medicines  Take over-the-counter and prescription medicines only as told by your health care provider.  If you were prescribed an antibiotic medicine, take it as told by your health care provider. Do not stop taking the antibiotic even if you start to feel better. Activity  Gradually return to your normal activities as told by your health care provider. Ask your health care provider what activities are safe for you.  Rest as much as possible. Try to rest or take a nap while your baby is sleeping. Eating and drinking   Drink enough fluid to keep your urine pale yellow.  Eat high-fiber foods every day. These may help prevent or relieve constipation. High-fiber foods include: ? Whole grain cereals and breads. ? Brown rice. ? Beans. ? Fresh fruits and vegetables.  Do not try to lose weight quickly by cutting back on calories.  Take your prenatal vitamins until your postpartum checkup or until your health care provider tells you it is okay to stop. Lifestyle  Do not use any products that contain nicotine or tobacco, such as cigarettes and  e-cigarettes. If you need help quitting, ask your health care provider.  Do not drink alcohol, especially if you are breastfeeding. General instructions  Keep all follow-up visits for you and your baby as told by your health care provider. Most women visit their health care provider for a postpartum checkup within the first 3-6 weeks after delivery. Contact a health care provider if:  You feel unable to cope with the changes that your child brings to your life, and these feelings do not go away.  You feel unusually sad or worried.  Your breasts become red, painful, or hard.  You have a fever.  You have trouble holding urine or keeping urine from leaking.  You have little or no interest in activities you used to enjoy.  You have not breastfed at all and you have not had a menstrual period for 12 weeks after delivery.  You have stopped breastfeeding and you have not had a menstrual period for 12 weeks after you stopped breastfeeding.  You have questions about caring for yourself or your baby.  You pass a blood clot from your vagina. Get help right away if:  You have chest pain.  You have difficulty breathing.  You have sudden, severe leg pain.  You have severe pain or cramping in your lower abdomen.  You bleed from your vagina so much that you fill more than one sanitary pad in one hour. Bleeding should not be heavier than your heaviest period.  You develop a severe headache.  You faint.  You have blurred vision or spots in your vision.  You have bad-smelling vaginal discharge.  You have thoughts about hurting yourself or your baby. If you ever feel like you may hurt yourself or others, or have thoughts about taking your own life, get help right away. You can go to the nearest emergency department or call:  Your local emergency services (911 in the [place]  A suicide crisis helpline, such as the [facility] at 541 423 5409. This is open 24  hours a day. Summary  The period of time right after you deliver your newborn up to 6-12 weeks after delivery is called the postpartum period.  Gradually return to your normal activities as told by your health care provider.  Keep all follow-up visits for you and your baby as told by your health care provider. This information is not intended to replace advice given to you by your health care provider. Make sure you discuss any questions you have with your health care provider. Document Revised: 04/05/2017 Document Reviewed: 01/14/2017 Elsevier Patient Education  2020 [employer].

## 2019-07-18 ENCOUNTER — Inpatient Hospital Stay (HOSPITAL_COMMUNITY): Payer: Managed Care, Other (non HMO)

## 2019-07-18 ENCOUNTER — Inpatient Hospital Stay (HOSPITAL_COMMUNITY)
Admission: AD | Admit: 2019-07-18 | Payer: Managed Care, Other (non HMO) | Source: Home / Self Care | Admitting: Obstetrics and Gynecology

## 2019-07-29 ENCOUNTER — Ambulatory Visit (INDEPENDENT_AMBULATORY_CARE_PROVIDER_SITE_OTHER): Payer: Managed Care, Other (non HMO) | Admitting: General Practice

## 2019-07-29 ENCOUNTER — Other Ambulatory Visit: Payer: Self-pay

## 2019-07-29 VITALS — BP 117/74 | HR 91 | Ht 62.0 in | Wt 141.0 lb

## 2019-07-29 DIAGNOSIS — Z0130 Encounter for examination of blood pressure without abnormal findings: Secondary | ICD-10-CM

## 2019-07-29 DIAGNOSIS — O906 Postpartum mood disturbance: Secondary | ICD-10-CM

## 2019-07-29 NOTE — Progress Notes (Signed)
Patient presents to office today for BP check following delivery on 3/31. Patient reports occasional migraine which is normal for her but denies dizziness or blurry vision. She had elevated BP towards the end of pregnancy- was not sent home on medication for this. BP today 117/74. Advised she contact us if she experiences severe headache, dizziness or blurry vision. Patient has elevated phq 9 today- will follow up with Asher Muir. Patient will return for pp visit on 5/6.  Chase Caller RN BSN 07/29/19

## 2019-07-30 NOTE — BH Specialist Note (Signed)
Integrated Behavioral Health via Telemedicine Video Visit  07/30/2019 Emilea Goga [id number]  Number of Integrated Behavioral Health visits: 1 Session Start time: 3:20  Session End time: 4:03 Total time: 61  Referring Provider: Raynelle Dick, MD Type of Visit: Video Patient/Family location: Home [facility] Provider location: [facility] All persons participating in visit: Patient Terri Combs and [facility]ner    Confirmed patient's address: Yes  Confirmed patient's phone number: Yes  Any changes to demographics: No   Confirmed patient's insurance: Yes  Any changes to patient's insurance: No   Discussed confidentiality: Yes   I connected with Dilcia Leflore  by a video enabled telemedicine application and verified that I am speaking with the correct person using two identifiers.     I discussed the limitations of evaluation and management by telemedicine and the availability of in person appointments.  I discussed that the purpose of this visit is to provide behavioral health care while limiting exposure to the novel coronavirus.   Discussed there is a possibility of technology failure and discussed alternative modes of communication if that failure occurs.  I discussed that engaging in this video visit, they consent to the provision of behavioral healthcare and the services will be billed under their insurance.  Patient and/or legal guardian expressed understanding and consented to video visit: Yes   PRESENTING CONCERNS: Patient and/or family reports the following symptoms/concerns: Pt states her primary concern today is adjusting to new motherhood, with poor appetite, lack of quality sleep, and mild anxiety related to colicky baby.  Duration of problem: Postpartum; Severity of problem: moderate  STRENGTHS (Protective Factors/Coping Skills): Good social support; self-awareness  GOALS ADDRESSED: Patient will: 1.  Reduce symptoms of: anxiety and depression  2.  Increase knowledge and/or ability  of: healthy habits and stress reduction  3.  Demonstrate ability to: Increase healthy adjustment to current life circumstances and Increase adequate support systems for patient/family  INTERVENTIONS: Interventions utilized:  Solution-Focused Strategies, Sleep Hygiene, Psychoeducation and/or Health Education and Link to [employer] Standardized Assessments completed: PHQ9/GAD7 given in past two weeks  ASSESSMENT: Patient currently experiencing Adjustment disorder with mixed depression and anxiety.   Patient may benefit from psychoeducation and brief therapeutic interventions regarding coping with symptoms of depression and anxiety .  PLAN: 1. Follow up with behavioral health clinician on : Two weeks (Will discuss at next visit options for ongoing therapy) 2. Behavioral recommendations:  -Continue sleeping when baby sleeps (especially baby's morning nap time, if baby stays up at night) -Continue taking prenatal vitamin daily -Set up basket of healthy snacks near couch (ex. Fruit, nuts) -Register for and attend at least one new mom support group at either [email or web address].com or [email or web address].net  -Consider "Happiest Baby on the Block" recommendations for calming a fussy baby (book, video, or online search for clips) -Consider using apps for additional self-care, as discussed (on After Visit Summary)  3. Referral(s): Integrated Art gallery manager (In Clinic) and [employer]:  New mom support  I discussed the assessment and treatment plan with the patient and/or parent/guardian. They were provided an opportunity to ask questions and all were answered. They agreed with the plan and demonstrated an understanding of the instructions.   They were advised to call back or seek an in-person evaluation if the symptoms worsen or if the condition fails to improve as anticipated.  Valetta Close [facility]  Depression screen [facility] 2/9 07/29/2019 06/29/2019 06/19/2019 05/13/2019 04/29/2019   Decreased Interest 2 1 0 0 1  Down, Depressed, Hopeless 2  1 1 0 0  PHQ - 2 Score 4 2 1  0 1  Altered sleeping 2 3 0 - 1  Tired, decreased energy 3 3 0 - 2  Change in appetite 2 1 0 - 0  Feeling bad or failure about yourself  0 0 0 - 0  Trouble concentrating 0 0 0 - 0  Moving slowly or fidgety/restless 0 0 0 - 0  Suicidal thoughts 0 0 0 - 0  PHQ-9 Score 11 9 1  - 4   GAD 7 : Generalized Anxiety Score 07/29/2019 06/29/2019 06/19/2019 04/29/2019  Nervous, Anxious, on Edge 1 1 1  0  Control/stop worrying 1 1 0 0  Worry too much - different things 1 2 0 1  Trouble relaxing 2 3 0 1  Restless 1 3 0 0  Easily annoyed or irritable 3 3 0 1  Afraid - awful might happen 0 1 0 0  Total GAD 7 Score 9 14 1  3

## 2019-08-04 ENCOUNTER — Ambulatory Visit (INDEPENDENT_AMBULATORY_CARE_PROVIDER_SITE_OTHER): Payer: Managed Care, Other (non HMO) | Admitting: Clinical

## 2019-08-04 DIAGNOSIS — F4323 Adjustment disorder with mixed anxiety and depressed mood: Secondary | ICD-10-CM | POA: Diagnosis not present

## 2019-08-04 DIAGNOSIS — O906 Postpartum mood disturbance: Secondary | ICD-10-CM

## 2019-08-04 NOTE — Patient Instructions (Addendum)
Behavioral Health Resources:   What if I or someone I know is in crisis?  . If you are thinking about harming yourself or having thoughts of suicide, or if you know someone who is, seek help right away.  . Call your doctor or mental health care provider.  . Call 911 or go to a hospital emergency room to get immediate help, or ask a friend or family member to help you do these things.  . Call the [place] National Suicide Prevention Lifeline's toll-free, 24-hour hotline at 1-800-273-TALK (1-800-273-8255) or TTY: 1-800-799-4 TTY (1-800-799-4889) to talk to a trained counselor.  . If you are in crisis, make sure you are not left alone.   . If someone else is in crisis, make sure he or she is not left alone   24 Hour :   [place] National Suicide Hotline: 1-800-273-8255  Therapeutic Alternative Mobile Crisis: 1-877-626-1772   [facility]  [address], [place], [place] [address]  800-711-2635 or 336-832-9700  [facility] Crisis Line (Domestic Violence, Rape & Victim Assistance)  336-273-7273  [facility]  [address] [place], [place]  [address]   1-855-788-8787 or 336-676-6840   [facility] [place] [facility]: 336-899-1505 (8am-4pm) or 1-866- 261-5769 (after hours)           [facility], [address], [place], [place]  1-800-711-2635 Fax: 336-832-9701 [email or web address]  *Interpreters available *Accepts Medicaid, Medicare, uninsured  [facility]   Mon-Fri: 8am-5pm [address], [place], [place] [phone](phone); 336-272-9885(fax) www.[email or web address]  *Accepts Medicare  [facility] Mon, Tues, Thurs, Fri: 8am-4pm [address], [place], [place]  [phone] (phone); 336-256-0121 (fax) www.[email or web address]  *Accepts Medicare  [facility] Mon-Fri: 9am-5pm  [address], [place], [place] [phone] (phone); 336-540-9454  www.[email or web address]  *Accepts Medicaid  [facility] [address], [place], [place]  [phone]  [email or web address]://[email or web address]   [facility] Mon-Fri, 8:30am-12pm/1pm-2:30pm [address], [place], [place] [phone] (phone); 336-387-9167 (fax) www.fspcares.org  *Accepts Medicaid, sliding-scale*Bilingual services available  [facility] Mon-Fri, 8am-7pm [address], [place], [place]  336-899-8800(phone); 336-899-8811(fax) www.[email or web address]  *Accepts Medicaid *Bilingual services available  [facility] Mon-Fri: 8am-5pm, Saturday by appointment only [address], [place], [place] [phone] (phone); [phone] (fax) www.[email or web address]   [facility] [address], [place], [place] [phone] www.[email or web address]  *Free & reduced services for uninsured and underinsured individuals *Bilingual services for Spanish-speaking clients 21 and under  [facility], [address], [place], [place] [phone](phone); 336-676-6409(fax) www.[email or web address]  *Bring your own interpreter at first visit *Accepts Medicare and Medicaid  [facility] Mon-Fri: 9am-5:30pm [address], [place], [place] [phone] (phone), [phone] (fax) After hours crisis line: 336-763-1165 www.[email or web address]  *Accepts Medicare and Medicaid  [facility] Mon-Thurs, 8am-6pm [address], [place], [place]  [phone] (phone); 336-288-0738 (fax) http://[email or web address]  *Subsidized costs available  [facility] Mon-Fri: 8am-4pm [address], [place],  [place] [phone](phone); 336-834-9698(fax) www.[email or web address]  *Accepts Medicaid  [facility] [place] [facility] Same day access hours: Mon-Fri, 8:30-3pm Crisis hours: Mon-Fri, 8am-5pm [address], [place], [place]  [facility] Same day access hours: Mon-Fri, 8:30-3pm Crisis hours: Mon-Fri, 8am-8pm [address], [place], [place] [phone] (phone); 336-899-1513 (fax) www.[email or web address]  *Accepts Medicaid and Medicare  [facility] Mon, Wed, Fri: 9am-9pm Tues, Thurs: 9am-6pm [address],   [place], [place]  [phone] (phone); [phone] (fax) [email or web address]://[email or web address]  *(Accepts Medicare and Medicaid; payment plans available)*Bilingual services available  [facility] [address], [place], [place] [phone] (phone); 336-272-1182 (fax) www.[email or web address]   [facility] [address], [place], [place]  [phone]  www.[email or web address]  *Bilingual services available  [facility] Mon-Thurs: 8am-8pm [address], [place], [place] [phone] (phone); [phone] (fax) [email or web address]://[email or web address]  *Accepts Medicaid*Bilingual services available  [facility] [address], [place], [place] [phone] (phone) [email or web address]://[email or web address]  *Accepts Medicaid *Bilingual services available  [facility] Mon-Fri, 9am-4:45pm [address], [place], [place] [phone] (phone); [phone] (fax) http://[email or web address]  *Accepts Medicare  [facility] Mon-Thurs: 8:30-8pm, Fri: 8:30am-7pm [address], [place], [place] (3rd floor) [phone] (phone); [phone] (fax) [email or web address]://[email or web address]  *Accepts Medicaid; income-based reduced rates available  [facility] Mon-Fri: 8am-5pm [address], [place],  [place] [phone] (phone); [phone] (fax) [email or web address]://[email or web address]  *Accepts Medicaid*Bilingual services available  [facility] [address], [place], [place]  [phone] (phone); [phone] (fax) www.[email or web address]  *Free emergency housing and clinical services for youth in crisis  [facility] [place])  [address],  336-373-1402 www.mhag.org  *Provides direct services to individuals in recovery from mental illness, including support groups, recovery skills classes, and one on one peer support  NAMI (National Alliance on Mental Illness) [place] NAMI helpline: 336-370-4264  https://namiguilford.org  *A community hub for information relating to local resources and services for the friends and families of individuals living alongside a mental health condition, as well as the individuals themselves. Classes and support groups also provided   /Emotional Wellbeing Apps and Websites Here are a few free apps meant to help you to help yourself.  To find, try searching on the internet to see if the app is offered on Apple/Android devices. If your first choice doesn't come up on your device, the good news is that there are many choices! Play around with different apps to see which ones are helpful to you.    Calm This is an app meant to help increase calm feelings. Includes info, strategies, and tools for tracking your feelings.      Calm Harm  This app is meant to help with self-harm. Provides many 5-minute or 15-min coping strategies for doing instead of hurting yourself.       Healthy Minds Health Minds is a problem-solving tool to help deal with emotions and cope with stress you encounter wherever you are.      MindShift This app can help people cope with anxiety. Rather than trying to avoid anxiety, you can make an important shift and face it.      MY3  MY3 features a support system, safety plan and  resources with the goal of offering a tool to use in a time of need.       My Life My Voice  This mood journal offers a simple solution for tracking your thoughts, feelings and moods. Animated emoticons can help identify your mood.       Relax Melodies Designed to help with sleep, on this app you can mix sounds and meditations for relaxation.      Smiling Mind Smiling Mind is meditation made easy: it's a simple tool that helps put a smile on your mind.        Stop, Breathe & Think  A friendly, simple guide for people through meditations for mindfulness and compassion.  Stop,   Breathe and Think Kids Enter your current feelings and choose a "mission" to help you cope. Offers videos for certain moods instead of just sound recordings.       Team Orange The goal of this tool is to help teens change how they think, act, and react. This app helps you focus on your own good feelings and experiences.      The Virtual Hope Box The Virtual Hope Box (VHB) contains simple tools to help patients with coping, relaxation, distraction, and positive thinking.      

## 2019-08-17 NOTE — BH Specialist Note (Signed)
Pt did not arrive to video visit and did not answer the phone ; Left HIPPA-compliant message to call back Asher Muir from [facility] [employer] at [facility] at [phone] (main office) or [phone] (Gustavo Dispenza's office).  ; left MyChart message for patient.     Integrated Behavioral Health via Telemedicine Video Visit  08/17/2019 Terri Combs [id number]  [name]

## 2019-08-18 ENCOUNTER — Other Ambulatory Visit: Payer: Self-pay

## 2019-08-18 ENCOUNTER — Ambulatory Visit: Payer: Managed Care, Other (non HMO) | Admitting: Clinical

## 2019-08-18 DIAGNOSIS — Z5329 Procedure and treatment not carried out because of patient's decision for other reasons: Secondary | ICD-10-CM

## 2019-08-18 DIAGNOSIS — Z91199 Patient's noncompliance with other medical treatment and regimen due to unspecified reason: Secondary | ICD-10-CM

## 2019-08-20 ENCOUNTER — Encounter: Payer: Self-pay | Admitting: Obstetrics and Gynecology

## 2019-08-20 ENCOUNTER — Other Ambulatory Visit (HOSPITAL_COMMUNITY)
Admission: RE | Admit: 2019-08-20 | Discharge: 2019-08-20 | Disposition: A | Discharge status: Home / Self Care | Payer: Managed Care, Other (non HMO) | Source: Ambulatory Visit | Attending: Obstetrics and Gynecology | Admitting: Obstetrics and Gynecology

## 2019-08-20 ENCOUNTER — Other Ambulatory Visit: Payer: Self-pay

## 2019-08-20 ENCOUNTER — Ambulatory Visit (INDEPENDENT_AMBULATORY_CARE_PROVIDER_SITE_OTHER): Payer: Managed Care, Other (non HMO) | Admitting: Obstetrics and Gynecology

## 2019-08-20 DIAGNOSIS — Z1332 Encounter for screening for maternal depression: Secondary | ICD-10-CM | POA: Diagnosis not present

## 2019-08-20 NOTE — Progress Notes (Signed)
    Post Partum Visit Note  Terri Combs is a 24 y.o. G51P1001 female who presents for a postpartum visit. She is 5 weeks postpartum following a SVD.  I have fully reviewed the prenatal and intrapartum course. The delivery was at 38.4 gestational weeks.  Anesthesia: epidural. Postpartum course has been unremarkable. Baby is doing well. Baby is feeding by both breast and bottle - Similac Neosure. Bleeding staining only. Bowel function is abnormal: constipation. Bladder function is normal. Patient is not sexually active. Contraception method is Depo-Provera injections. Postpartum depression screening: Negative.  The following portions of the patient's history were reviewed and updated as appropriate: allergies, current medications, past family history, past medical history, past social history, past surgical history and problem list.  Review of Systems Pertinent items are noted in HPI.    Objective:  Blood pressure 122/84, pulse 76, weight 136 lb 12.8 oz (62.1 kg), unknown if currently breastfeeding.  General:  alert and cooperative  Lungs: clear to auscultation bilaterally  Heart:  regular rate and rhythm, S1, S2 normal, no murmur, click, rub or gallop  Abdomen: soft, non-tender; bowel sounds normal; no masses,  no organomegaly   Vulva:  normal  Vagina: normal vagina  Cervix:  multiparous appearance  Corpus: normal  Adnexa:  normal adnexa  Rectal Exam: Not performed.        Assessment:   Normal postpartum exam. Pap smear at today's visit.  She needs 2 hour GTT, she was not aware of this. Will schedule to come in next week.  Pap collected today. BP normal   Plan:   Essential components of care per ACOG recommendations:  1.  Mood and well being: Patient with negative depression screening today. Reviewed local resources for support.  - Patient does not use tobacco. - hx of drug use? No    2. Infant care and feeding:  -Patient currently breastmilk feeding? Yes If needed, patient was  provided letter for work to allow for every 2-3 hr pumping breaks, and to be granted a private location to express breastmilk and refrigerated area to store breastmilk. Reviewed importance of draining breast regularly to support lactation. -Social determinants of health (SDOH) reviewed in EPIC. No concerns. The following needs were identified.  3. Sexuality, contraception and birth spacing - Patient does not want a pregnancy in the next year.  Desired family size is 1 children.  - Reviewed forms of contraception in tiered fashion. Patient desired Depo-Provera today.   - Discussed birth spacing of 18 months  4. Sleep and fatigue -Encouraged family/partner/community support of 4 hrs of uninterrupted sleep to help with mood and fatigue  5. Physical Recovery  - Discussed patients delivery and complications - Patient had No laceration, perineal healing reviewed. Patient expressed understanding - Patient has urinary incontinence? No Patient was referred to pelvic floor PT  - Patient is safe to resume physical and sexual activity  6.  Health Maintenance - Last pap smear done 2018 and was normal with negative HPV. Mammogram   - PCP follow up  Venia Carbon, NP [facility] [employer] [facility], [facility]

## 2019-08-24 LAB — CYTOLOGY - PAP
Chlamydia: NEGATIVE
Comment: NEGATIVE
Comment: NEGATIVE
Comment: NORMAL
Diagnosis: NEGATIVE
High risk HPV: NEGATIVE
Neisseria Gonorrhea: NEGATIVE

## 2019-09-30 ENCOUNTER — Other Ambulatory Visit: Payer: Self-pay | Admitting: *Deleted

## 2019-09-30 DIAGNOSIS — O24429 Gestational diabetes mellitus in childbirth, unspecified control: Secondary | ICD-10-CM

## 2019-10-01 ENCOUNTER — Ambulatory Visit (INDEPENDENT_AMBULATORY_CARE_PROVIDER_SITE_OTHER): Payer: Managed Care, Other (non HMO) | Admitting: *Deleted

## 2019-10-01 ENCOUNTER — Other Ambulatory Visit: Payer: Self-pay

## 2019-10-01 ENCOUNTER — Other Ambulatory Visit: Payer: Managed Care, Other (non HMO)

## 2019-10-01 VITALS — BP 110/78 | HR 78

## 2019-10-01 DIAGNOSIS — Z3042 Encounter for surveillance of injectable contraceptive: Secondary | ICD-10-CM | POA: Diagnosis not present

## 2019-10-01 MED ORDER — MEDROXYPROGESTERONE ACETATE 150 MG/ML IM SUSP
150.0000 mg | Freq: Once | INTRAMUSCULAR | Status: AC
  Administered 2019-10-01: 150 mg via INTRAMUSCULAR

## 2019-10-01 NOTE — Progress Notes (Signed)
Patient was assessed and managed by nursing staff during this encounter. I have reviewed the chart and agree with the documentation and plan. I have also made any necessary editorial changes.  Jaynie Collins, MD 10/01/2019 10:42 AM

## 2019-10-01 NOTE — BH Specialist Note (Signed)
Pt did not arrive to video visit and did not answer the phone ; Left HIPPA-compliant message to call back Asher Muir from [facility] [employer] [facility] at [facility] at [phone] (main office) or [phone] (Sulma Ruffino's office).  ; left MyChart message for patient.    Integrated Behavioral Health via Telemedicine Video [employer]) Visit  10/01/2019 Jisele Price [id number]   ARAMIS ZOBEL  Depression screen [facility] 2/9 10/01/2019 07/29/2019 06/29/2019 06/19/2019 05/13/2019  Decreased Interest 2 2 1  0 0  Down, Depressed, Hopeless 2 2 1 1  0  PHQ - 2 Score 4 4 2 1  0  Altered sleeping 1 2 3  0 -  Tired, decreased energy 3 3 3  0 -  Change in appetite 1 2 1  0 -  Feeling bad or failure about yourself  1 0 0 0 -  Trouble concentrating 0 0 0 0 -  Moving slowly or fidgety/restless 0 0 0 0 -  Suicidal thoughts 0 0 0 0 -  PHQ-9 Score 10 11 9 1  -  Some recent data might be hidden   GAD 7 : Generalized Anxiety Score 10/01/2019 07/29/2019 06/29/2019 06/19/2019  Nervous, Anxious, on Edge 1 1 1 1   Control/stop worrying 1 1 1  0  Worry too much - different things 1 1 2  0  Trouble relaxing 1 2 3  0  Restless 0 1 3 0  Easily annoyed or irritable 2 3 3  0  Afraid - awful might happen 0 0 1 0  Total GAD 7 Score 6 9 14  1

## 2019-10-01 NOTE — Progress Notes (Signed)
Here for depo-provera today.  Also noted has + phq9 . She has seen Asher Muir before and would like another appointment. Will schedule at check out today.tolerated injection without complaint. Sent to check out to schedule next depo injection and appointment with [facility].  Demarcus Thielke,RN

## 2019-10-05 ENCOUNTER — Ambulatory Visit: Payer: Managed Care, Other (non HMO) | Admitting: Clinical

## 2019-10-05 DIAGNOSIS — Z91199 Patient's noncompliance with other medical treatment and regimen due to unspecified reason: Secondary | ICD-10-CM

## 2019-10-05 DIAGNOSIS — Z5329 Procedure and treatment not carried out because of patient's decision for other reasons: Secondary | ICD-10-CM

## 2019-10-28 ENCOUNTER — Ambulatory Visit (INDEPENDENT_AMBULATORY_CARE_PROVIDER_SITE_OTHER): Payer: Managed Care, Other (non HMO) | Admitting: *Deleted

## 2019-10-28 ENCOUNTER — Other Ambulatory Visit: Payer: Self-pay

## 2019-10-28 ENCOUNTER — Other Ambulatory Visit (HOSPITAL_COMMUNITY)
Admission: RE | Admit: 2019-10-28 | Discharge: 2019-10-28 | Disposition: A | Discharge status: Home / Self Care | Payer: Managed Care, Other (non HMO) | Source: Ambulatory Visit | Attending: Family Medicine | Admitting: Family Medicine

## 2019-10-28 DIAGNOSIS — N898 Other specified noninflammatory disorders of vagina: Secondary | ICD-10-CM | POA: Insufficient documentation

## 2019-10-28 NOTE — Progress Notes (Signed)
Here for nurse visit for self swab. C/o fishy odor and whitish discharge. Would like to do self swab for BV and yeast.States does not need std testing. Instructed her how to collect self swab and that we would contact her if + and needs treatment. She voices understanding. Khrista Braun,RN

## 2019-10-29 LAB — CERVICOVAGINAL ANCILLARY ONLY
Bacterial Vaginitis (gardnerella): NEGATIVE
Candida Glabrata: NEGATIVE
Candida Vaginitis: NEGATIVE
Comment: NEGATIVE
Comment: NEGATIVE
Comment: NEGATIVE

## 2019-11-03 NOTE — Progress Notes (Signed)
Patient was assessed and managed by nursing staff during this encounter. I have reviewed the chart and agree with the documentation and plan. I have also made any necessary editorial changes.  Averie Hornbaker, MD 11/03/2019 8:19 AM   

## 2019-11-12 ENCOUNTER — Ambulatory Visit: Payer: Managed Care, Other (non HMO) | Admitting: Obstetrics and Gynecology

## 2019-11-26 ENCOUNTER — Ambulatory Visit: Payer: Managed Care, Other (non HMO)

## 2019-12-17 ENCOUNTER — Ambulatory Visit: Payer: Managed Care, Other (non HMO)

## 2019-12-28 ENCOUNTER — Ambulatory Visit (INDEPENDENT_AMBULATORY_CARE_PROVIDER_SITE_OTHER): Payer: Medicaid Other | Admitting: *Deleted

## 2019-12-28 ENCOUNTER — Other Ambulatory Visit: Payer: Self-pay

## 2019-12-28 ENCOUNTER — Encounter: Payer: Self-pay | Admitting: *Deleted

## 2019-12-28 VITALS — BP 118/82 | HR 79 | Ht 62.0 in | Wt 151.4 lb

## 2019-12-28 DIAGNOSIS — Z3042 Encounter for surveillance of injectable contraceptive: Secondary | ICD-10-CM

## 2019-12-28 MED ORDER — MEDROXYPROGESTERONE ACETATE 150 MG/ML IM SUSP
150.0000 mg | Freq: Once | INTRAMUSCULAR | Status: AC
  Administered 2019-12-28: 150 mg via INTRAMUSCULAR

## 2019-12-28 NOTE — Progress Notes (Signed)
Depo Provera 150 mg IM administered as scheduled. Pt tolerated well. Next injection due 11/29-12/13. Pt states she began bleeding approximately 2 weeks ago as spotting which then changed to dark brown blood along with thin streaks of clots. She denies pain.  I advised that this is most likely a period. I recommended that she monitor her bleeding and to let us know if her condition worsens. Pt voiced understanding.

## 2019-12-29 NOTE — Progress Notes (Signed)
Chart reviewed for nurse visit. Agree with plan of care.   Marylene Land, CNM 12/29/2019 6:32 PM

## 2019-12-31 ENCOUNTER — Ambulatory Visit: Payer: Medicaid Other

## 2020-02-18 IMAGING — US US MFM OB DETAIL+14 WK
1 series · 14 of 28 positions shown · non-contrast
Comparison: none

[Series 1: us mfm ob detail+14 wk · 71 acquisitions, 14 frames shown]
[im 3/71]
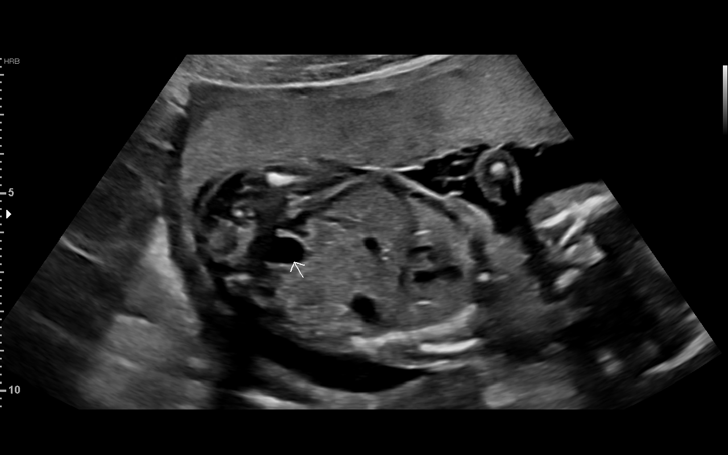
[im 8/71]
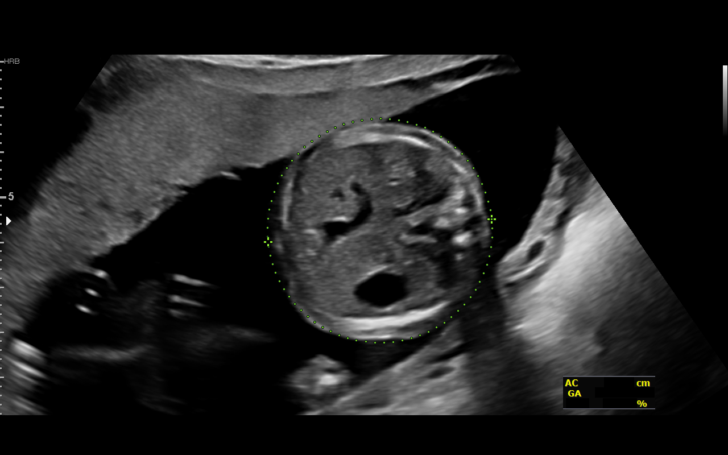
[im 13/71]
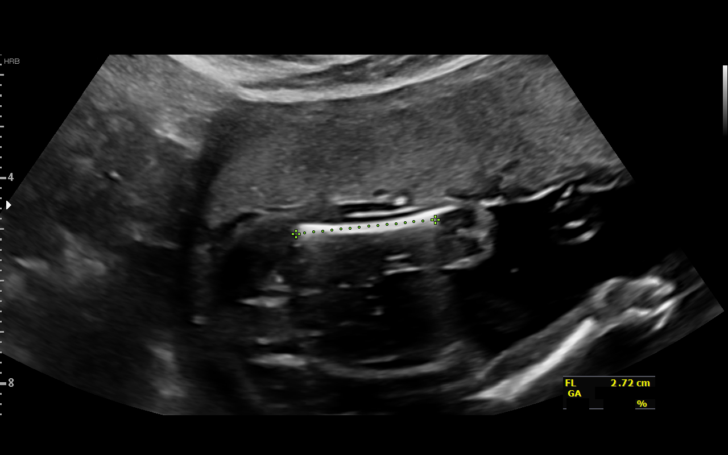
[im 19/71]
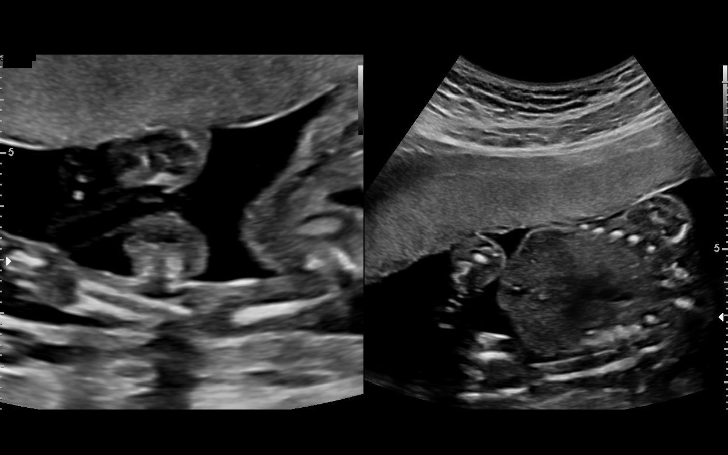
[im 24/71]
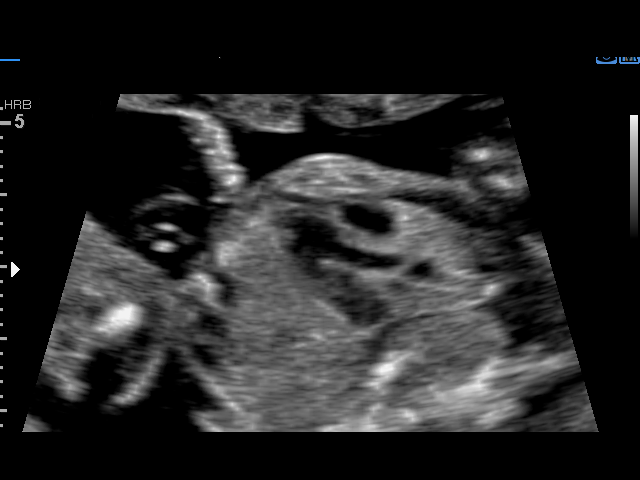
[im 29/71]
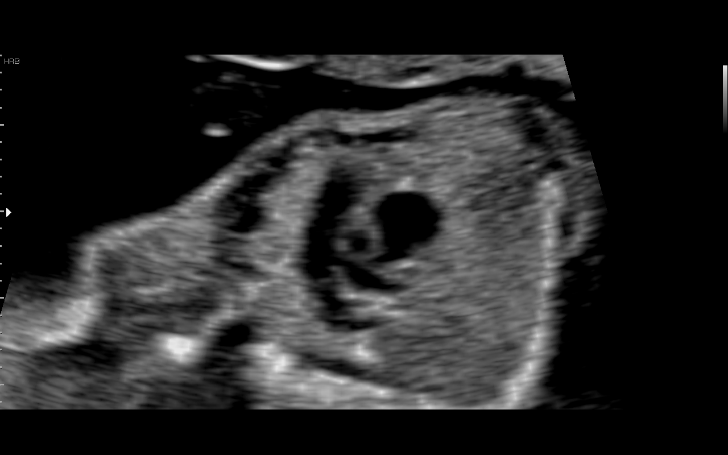
[im 34/71]
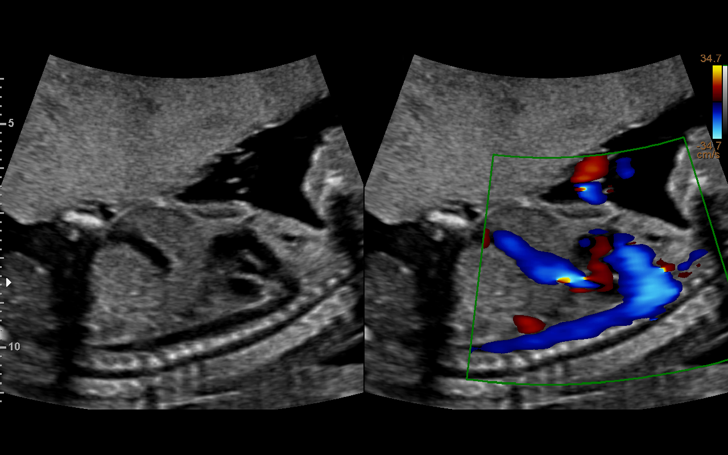
[im 39/71]
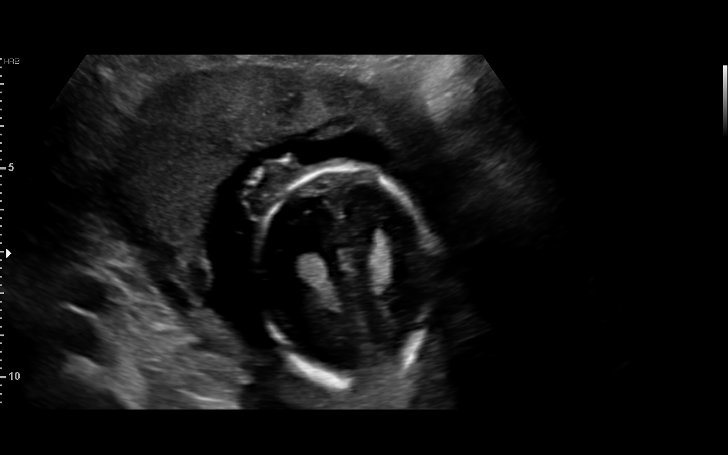
[im 45/71]
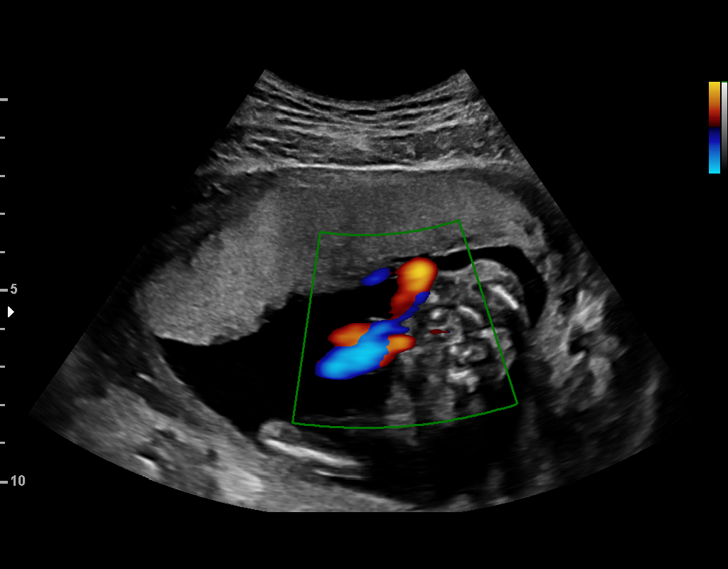
[im 50/71]
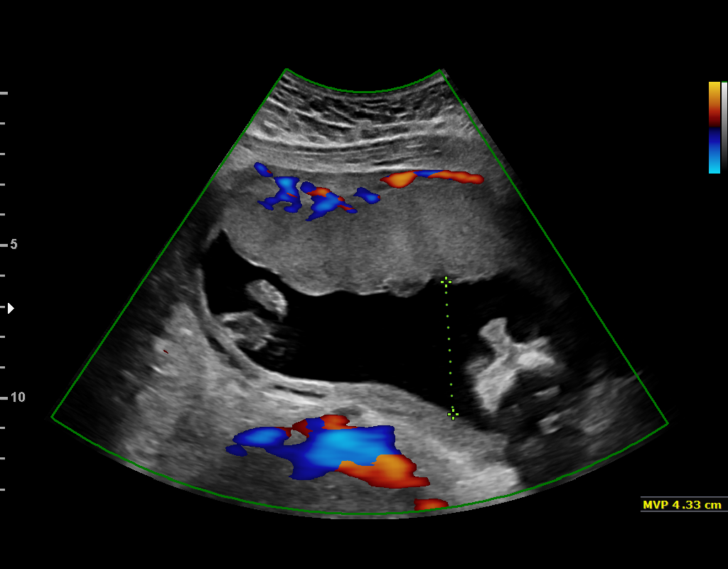
[im 55/71]
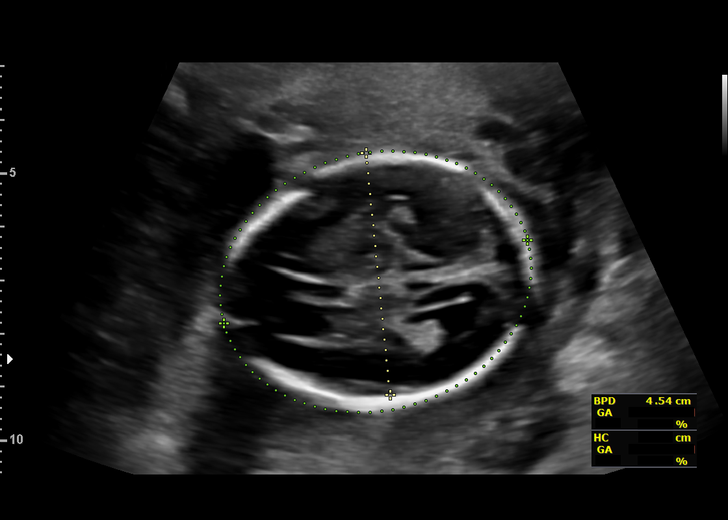
[im 60/71]
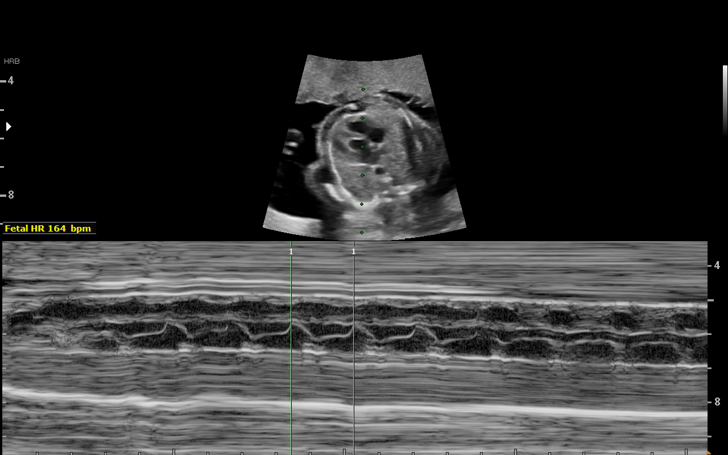
[im 65/71]
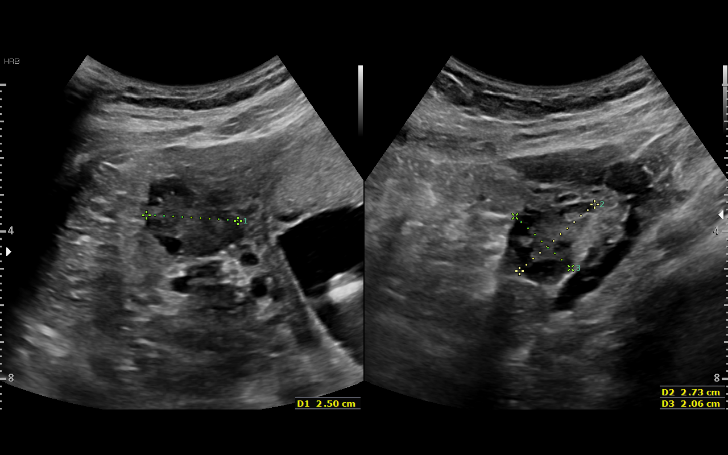
[im 71/71]
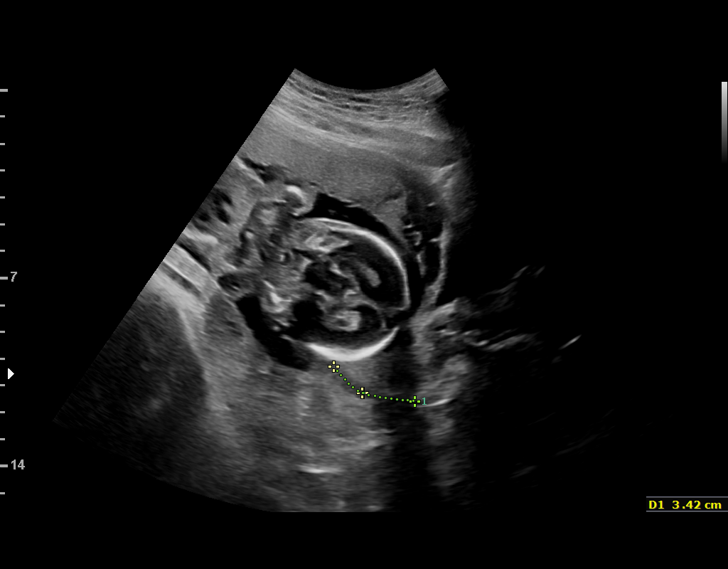

[14 of 28 positions shown; findings below may reference images not displayed]

RUDI NP

 ----------------------------------------------------------------------

 ----------------------------------------------------------------------
Indications

  Encounter for antenatal screening for
  malformations (low risk NIPS)
  19 weeks gestation of pregnancy
  Encounter for Strep B
  Genetic carrier (alpha thalassemia)
 ----------------------------------------------------------------------
Vital Signs

 BMI:
Fetal Evaluation

 Num Of Fetuses:         1
 Fetal Heart Rate(bpm):  164
 Cardiac Activity:       Observed
 Presentation:           Cephalic
 Placenta:               Anterior
 P. Cord Insertion:      Visualized, central

 Amniotic Fluid
 AFI FV:      Within normal limits

                             Largest Pocket(cm)

Biometry

 BPD:      45.4  mm     G. Age:  19w 5d         69  %    CI:        72.86   %    70 - 86
                                                         FL/HC:      16.3   %    16.1 -
 HC:      169.1  mm     G. Age:  19w 4d         55  %    HC/AC:      1.07        1.09 -
 AC:      157.8  mm     G. Age:  20w 6d         90  %    FL/BPD:     60.8   %
 FL:       27.6  mm     G. Age:  18w 3d         16  %    FL/AC:      17.5   %    20 - 24
 HUM:      28.1  mm     G. Age:  19w 0d         45  %
 CER:      21.4  mm     G. Age:  20w 3d         72  %
 NFT:       4.6  mm

 LV:        6.8  mm
 CM:        4.1  mm

 Est. FW:     311  gm    0 lb 11 oz      73  %
OB History

 Gravidity:    1
Gestational Age

 U/S Today:     19w 5d                                        EDD:   07/22/19
 Best:          19w 2d     Det. By:  Early Ultrasound         EDD:   07/25/19
                                     (12/11/18)
Anatomy

 Cranium:               Appears normal         Aortic Arch:            Appears normal
 Cavum:                 Appears normal         Ductal Arch:            Appears normal
 Ventricles:            Appears normal         Diaphragm:              Appears normal
 Choroid Plexus:        Appears normal         Stomach:                Appears normal, left
                                                                       sided
 Cerebellum:            Appears normal         Abdomen:                Appears normal
 Posterior Fossa:       Appears normal         Abdominal Wall:         Appears nml (cord
                                                                       insert, abd wall)
 Nuchal Fold:           Appears normal         Cord Vessels:           Appears normal (3
                                                                       vessel cord)
 Face:                  Appears normal         Kidneys:                Appear normal
                        (orbits and profile)
 Lips:                  Appears normal         Bladder:                Appears normal
 Thoracic:              Appears normal         Spine:                  Not well visualized
 Heart:                 Appears normal         Upper Extremities:      Appears normal
                        (4CH, axis, and
                        situs)
 RVOT:                  Appears normal         Lower Extremities:      Appears normal
 LVOT:                  Appears normal

 Other:  Male gender. Heels and 5th digit visualized.
Cervix Uterus Adnexa

 Cervix
 Normal appearance by transabdominal scan.
Impression

 Normal interval growth.  No ultrasonic evidence of structural
 fetal anomalies.
 Suboptimal views of the fetal spine was obtatined secondary
 to fetal position.
 Good fetal movement and amniotic fluid.
 Low risk NIPS
Recommendations

 Follow up growth in 4 weeks.

## 2020-03-14 ENCOUNTER — Ambulatory Visit (INDEPENDENT_AMBULATORY_CARE_PROVIDER_SITE_OTHER): Payer: Medicaid Other | Admitting: *Deleted

## 2020-03-14 ENCOUNTER — Other Ambulatory Visit: Payer: Self-pay

## 2020-03-14 ENCOUNTER — Encounter: Payer: Self-pay | Admitting: *Deleted

## 2020-03-14 VITALS — BP 111/80 | HR 74 | Ht 62.0 in | Wt 159.8 lb

## 2020-03-14 DIAGNOSIS — Z3042 Encounter for surveillance of injectable contraceptive: Secondary | ICD-10-CM

## 2020-03-14 DIAGNOSIS — O24429 Gestational diabetes mellitus in childbirth, unspecified control: Secondary | ICD-10-CM

## 2020-03-14 MED ORDER — MEDROXYPROGESTERONE ACETATE 150 MG/ML IM SUSP
150.0000 mg | Freq: Once | INTRAMUSCULAR | Status: AC
  Administered 2020-03-14: 150 mg via INTRAMUSCULAR

## 2020-03-14 NOTE — Progress Notes (Signed)
ATTESTATION OF SUPERVISION OF RN: Evaluation and management procedures were performed by the RN under my supervision and collaboration. I have reviewed the nursing note and chart and agree with the management and plan for this patient.  Mykelle Cockerell, CNM  

## 2020-03-14 NOTE — Progress Notes (Addendum)
Depo Provera 150 mg IM administered as scheduled. Pt tolerated well. Next injection due 2/14-2/28/22.  Pt stated that she has noticed light spotting after intercourse. She is not having periods. Pt was advised this is within normal limits. She denied painful intercourse. She was informed that Annual Gyn exam is due 08/20/20. Last Pap (normal) was done 08/20/19 during PP exam. Pt stated that she was not able to keep appt for PP 2hr GTT on 10/01/19. She would still like to have the test - will schedule @ check out today. Pt has documented food insecurity. I informed her of the [employer] on site and gave her a short tour. She will be contacted by a Solicitor to learn more about the program. She declined need for food today. Pt voiced understanding of all information and instructions given.

## 2020-03-17 IMAGING — US US MFM OB FOLLOW-UP
1 series · 13 of 28 positions shown · non-contrast
Comparison: none

[Series 1: us mfm ob follow-up · 13 of 54 slices shown]
[im 2/54]
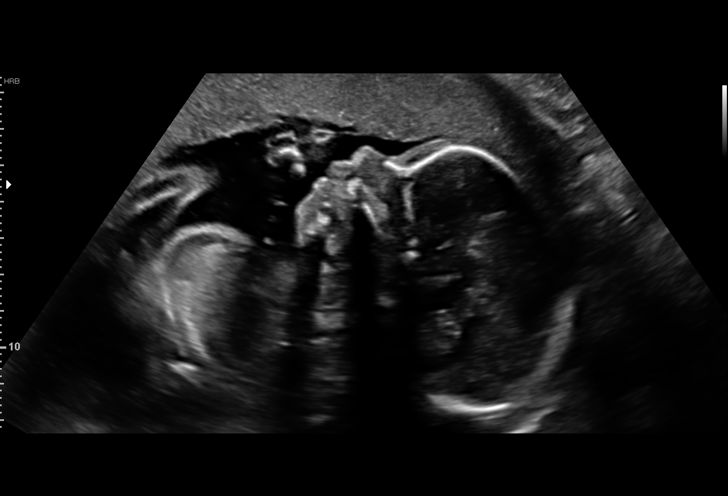
[im 6/54]
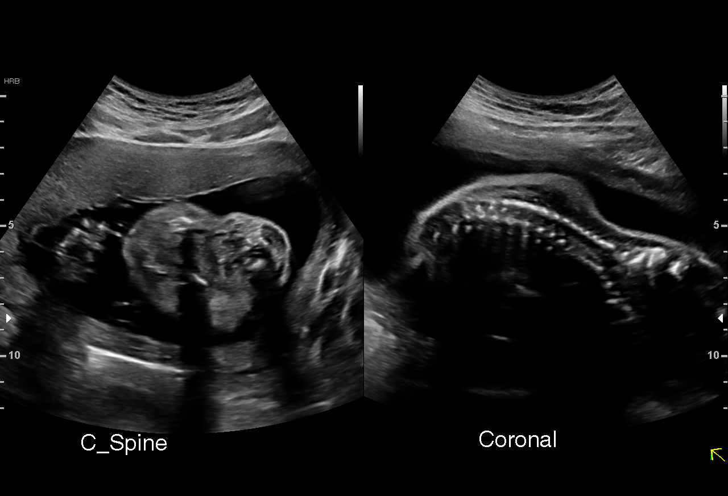
[im 10/54]
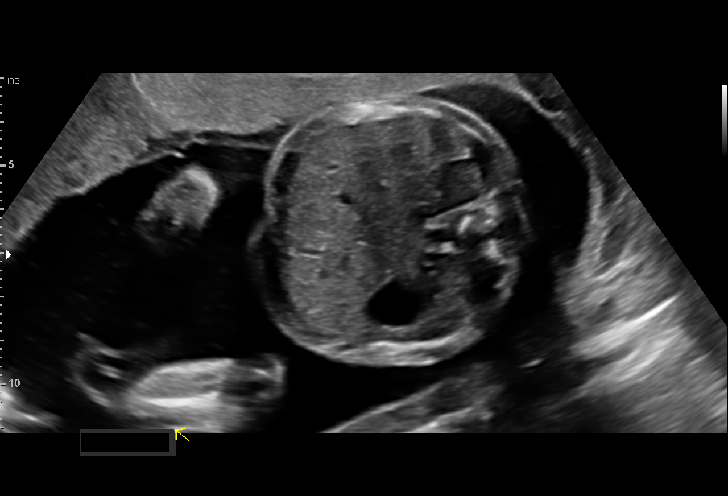
[im 14/54]
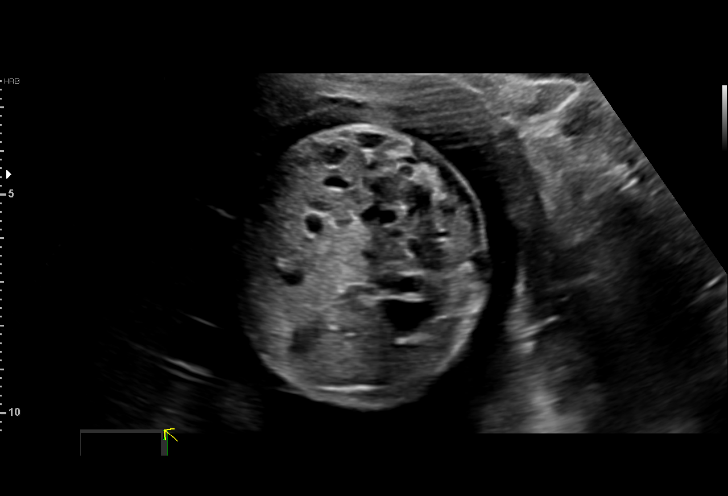
[im 18/54]
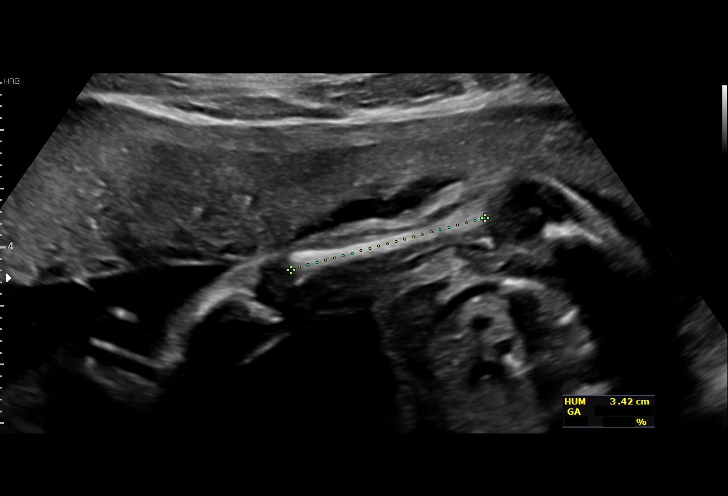
[im 22/54]
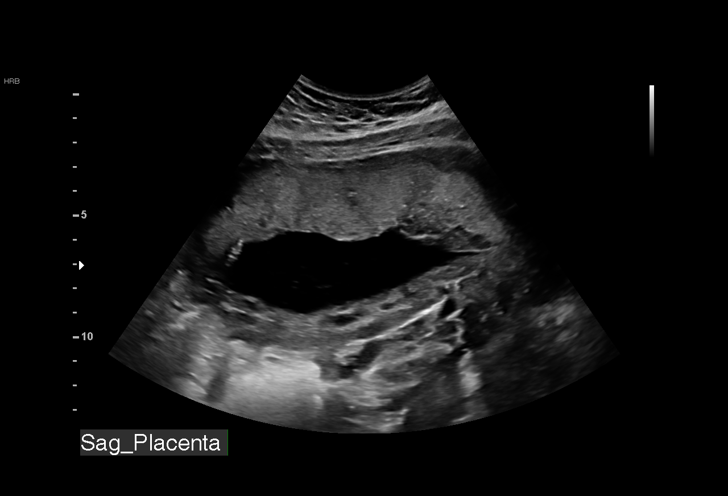
[im 28/54]
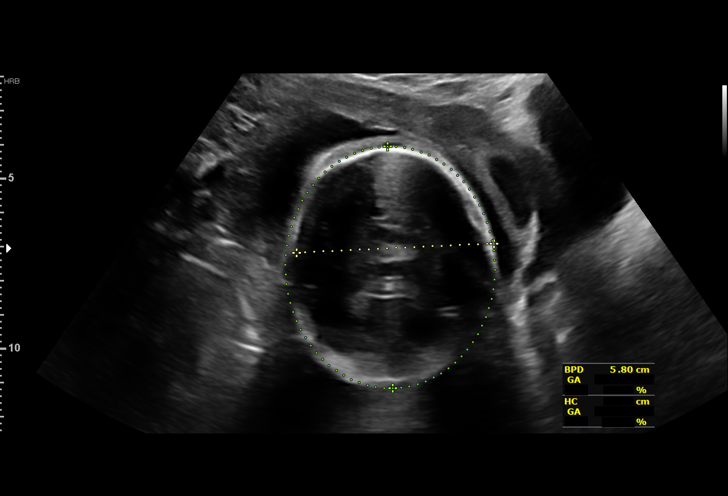
[im 32/54]
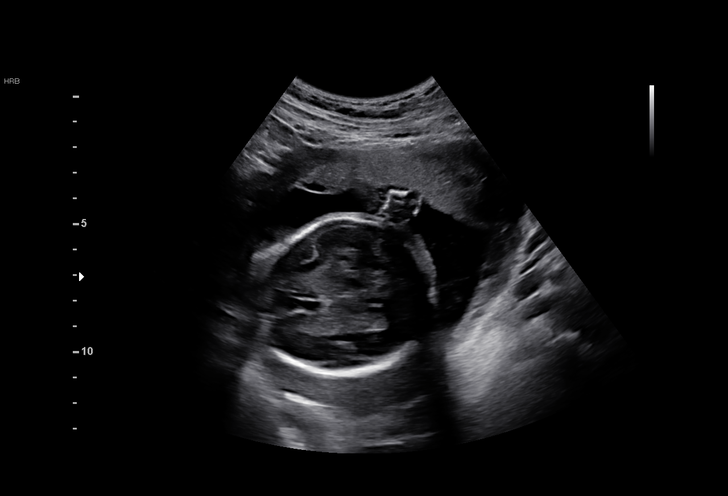
[im 36/54]
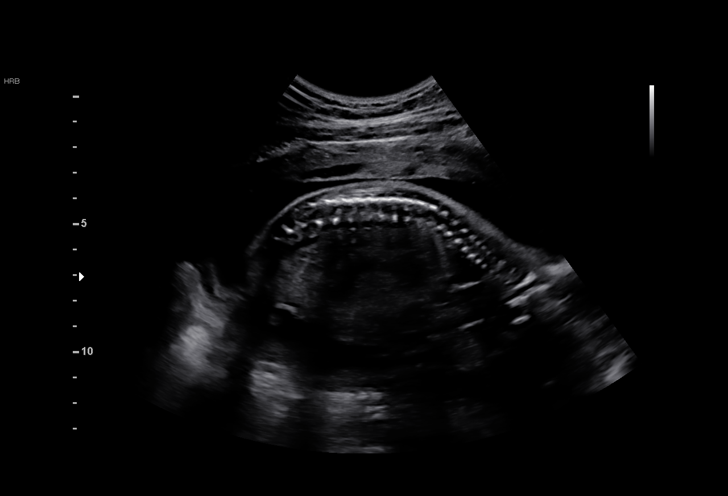
[im 40/54]
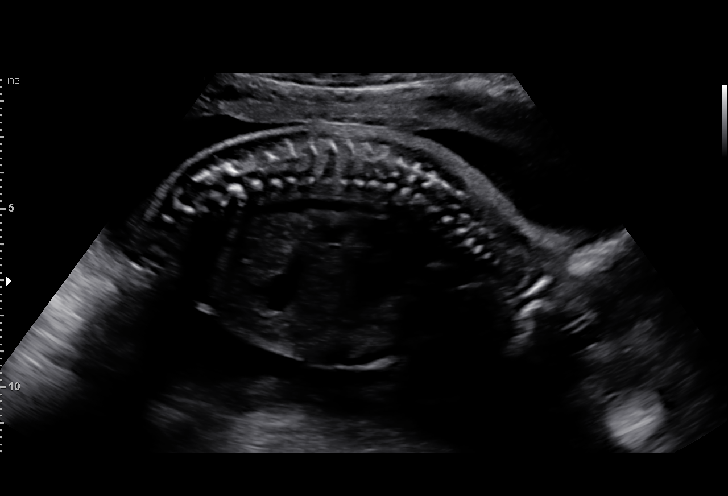
[im 44/54]
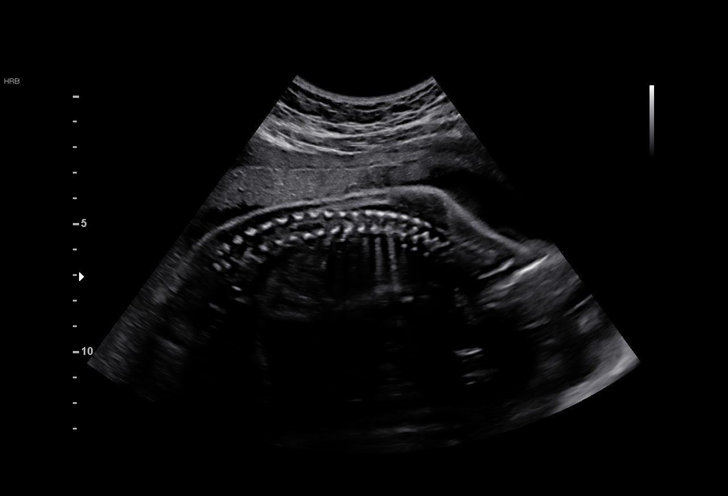
[im 48/54]
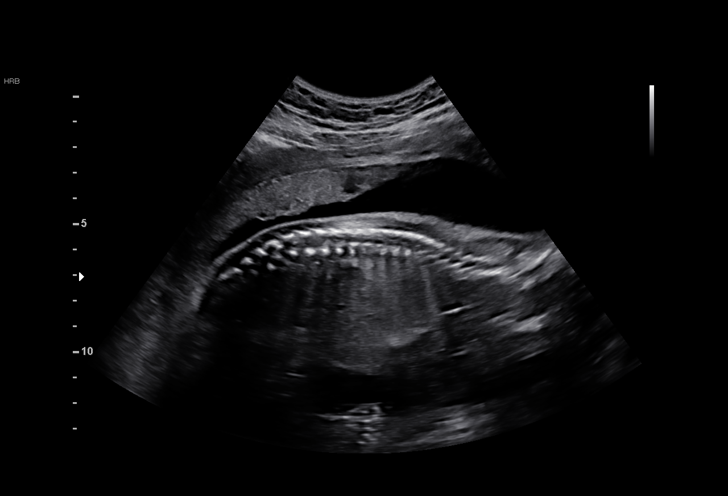
[im 52/54]
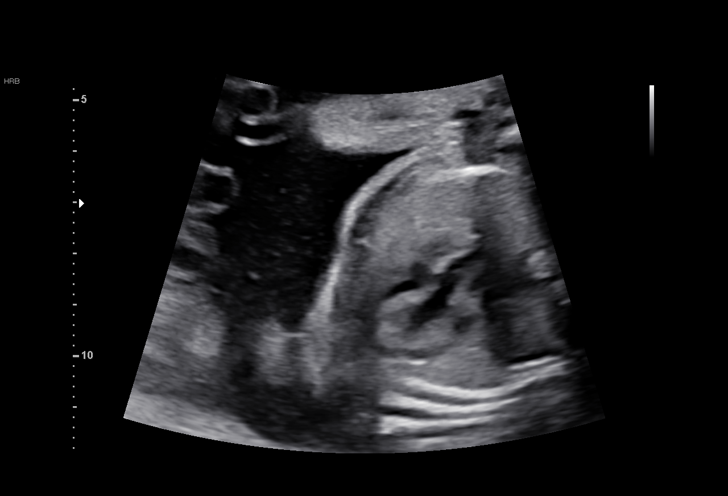

[13 of 28 positions shown; findings below may reference images not displayed]

SI NA NP

 ----------------------------------------------------------------------

 ----------------------------------------------------------------------
Indications

  Fetal abnormality - other known or suspected
  Encounter for other antenatal screening
  follow-up
  Encounter for Strep B
  Genetic carrier (alpha thalassemia)
  23 weeks gestation of pregnancy
 ----------------------------------------------------------------------
Vital Signs

 BMI:
Fetal Evaluation

 Num Of Fetuses:         1
 Fetal Heart Rate(bpm):  161
 Cardiac Activity:       Observed
 Presentation:           Cephalic
 Placenta:               Anterior
 P. Cord Insertion:      Previously Visualized

 Amniotic Fluid
 AFI FV:      Within normal limits

                             Largest Pocket(cm)

Biometry

 BPD:        58  mm     G. Age:  23w 5d         63  %    CI:        75.23   %    70 - 86
                                                         FL/HC:      19.0   %    19.2 -
 HC:      212.1  mm     G. Age:  23w 2d         34  %    HC/AC:      1.03        1.05 -
 AC:      205.1  mm     G. Age:  25w 1d         90  %    FL/BPD:     69.5   %    71 - 87
 FL:       40.3  mm     G. Age:  23w 0d         30  %    FL/AC:      19.6   %    20 - 24
 HUM:      34.2  mm     G. Age:  21w 5d          8  %

 Est. FW:     660  gm      1 lb 7 oz     80  %
OB History

 Gravidity:    1
Gestational Age

 U/S Today:     23w 6d                                        EDD:   07/21/19
 Best:          23w 2d     Det. By:  Early Ultrasound         EDD:   07/25/19
                                     (12/11/18)
Anatomy

 Cranium:               Appears normal         Aortic Arch:            Previously seen
 Cavum:                 Previously seen        Ductal Arch:            Previously seen
 Ventricles:            Previously seen        Diaphragm:              Appears normal
 Choroid Plexus:        Previously seen        Stomach:                Appears normal, left
                                                                       sided
 Cerebellum:            Previously seen        Abdomen:                Appears normal
 Posterior Fossa:       Previously seen        Abdominal Wall:         Previously seen
 Nuchal Fold:           Previously seen        Cord Vessels:           Previously seen
 Face:                  Orbits and profile     Kidneys:                Appear normal
                        previously seen
 Lips:                  Previously seen        Bladder:                Appears normal
 Thoracic:              Appears normal         Spine:                  Ltd views no
                                                                       intracranial signs of
                                                                       NTD
 Heart:                 Appears normal         Upper Extremities:      Previously seen
                        (4CH, axis, and
                        situs)
 RVOT:                  Previously seen        Lower Extremities:      Previously seen
 LVOT:                  Previously seen

 Other:  Male gender. Heels and 5th digit visualized.
Cervix Uterus Adnexa

 Cervix
 Length:           3.17  cm.
 Not visualized (advanced GA >21wks)
Comments

 This patient was seen for a follow up ultrasound as the views
 of the fetal spine were unable to be fully visualized during her
 last exam. She denies any problems since her last exam.
 She was informed that the fetal growth and amniotic fluid
 level appears appropriate for her gestational age.
 The views of the fetal spine were visualized today. There
 were no obvious spine anomalies noted today.
 Follow up as indicated.

## 2020-03-28 ENCOUNTER — Other Ambulatory Visit: Payer: Medicaid Other

## 2020-05-24 ENCOUNTER — Other Ambulatory Visit: Payer: Self-pay

## 2020-05-24 ENCOUNTER — Other Ambulatory Visit: Payer: Medicaid Other

## 2020-05-24 DIAGNOSIS — O24429 Gestational diabetes mellitus in childbirth, unspecified control: Secondary | ICD-10-CM

## 2020-05-25 LAB — GLUCOSE TOLERANCE, 2 HOURS
Glucose, 2 hour: 57 mg/dL — ABNORMAL LOW (ref 65–139)
Glucose, GTT - Fasting: 86 mg/dL (ref 65–99)

## 2020-05-30 ENCOUNTER — Ambulatory Visit: Payer: Medicaid Other

## 2020-07-12 MED ORDER — BORIC ACID CRYS: 600.0000 mg | CRYSTALS | Freq: Every day | 5 refills | Status: DC

## 2020-07-15 MED ORDER — BORIC ACID CRYS: 600.0000 mg | CRYSTALS | Freq: Every day | 5 refills | Status: DC

## 2020-07-15 NOTE — Addendum Note (Signed)
Addended by: Levie Heritage on: 07/15/2020 11:48 AM   Modules accepted: Orders

## 2020-07-18 MED ORDER — BORIC ACID VAGINAL 600 MG VA SUPP: 1.0000 | VAGINAL | 3 refills | Status: DC

## 2020-07-18 NOTE — Addendum Note (Signed)
Addended by: Levie Heritage on: 07/18/2020 08:12 AM   Modules accepted: Orders

## 2020-09-23 ENCOUNTER — Encounter: Payer: Self-pay | Admitting: Registered Nurse

## 2020-12-28 ENCOUNTER — Ambulatory Visit (HOSPITAL_COMMUNITY)
Admission: EM | Admit: 2020-12-28 | Discharge: 2020-12-28 | Disposition: A | Discharge status: Home / Self Care | Payer: Medicaid Other | Attending: Physician Assistant | Admitting: Physician Assistant

## 2020-12-28 ENCOUNTER — Encounter (HOSPITAL_COMMUNITY): Payer: Self-pay

## 2020-12-28 ENCOUNTER — Ambulatory Visit (INDEPENDENT_AMBULATORY_CARE_PROVIDER_SITE_OTHER): Payer: Medicaid Other

## 2020-12-28 DIAGNOSIS — J40 Bronchitis, not specified as acute or chronic: Secondary | ICD-10-CM | POA: Diagnosis not present

## 2020-12-28 DIAGNOSIS — J329 Chronic sinusitis, unspecified: Secondary | ICD-10-CM

## 2020-12-28 DIAGNOSIS — R509 Fever, unspecified: Secondary | ICD-10-CM

## 2020-12-28 DIAGNOSIS — R059 Cough, unspecified: Secondary | ICD-10-CM

## 2020-12-28 MED ORDER — ACETAMINOPHEN 325 MG PO TABS
ORAL_TABLET | ORAL | Status: AC
  Filled 2020-12-28: qty 3

## 2020-12-28 MED ORDER — ACETAMINOPHEN 325 MG PO TABS
975.0000 mg | ORAL_TABLET | Freq: Once | ORAL | Status: AC
  Administered 2020-12-28: 975 mg via ORAL

## 2020-12-28 MED ORDER — AMOXICILLIN-POT CLAVULANATE 875-125 MG PO TABS: 1.0000 | ORAL_TABLET | Freq: Two times a day (BID) | ORAL | 0 refills | Status: DC

## 2020-12-28 MED ORDER — AMOXICILLIN-POT CLAVULANATE 400-57 MG/5ML PO SUSR: 875.0000 mg | Freq: Two times a day (BID) | ORAL | 0 refills | Status: AC

## 2020-12-28 NOTE — ED Provider Notes (Signed)
[place]    CSN: [id number] Arrival date & time: 12/28/20  1758      History   Chief Complaint Chief Complaint  Patient presents with   Fever   Generalized Body Aches    HPI Terri Combs is a 25 y.o. female.   Patient presents today with a 2-week history of URI symptoms that have worsened in the past several days.  Reports initially she had some nasal congestion and cough but the symptoms have improved and now she is only experiencing intermittent cough, sore throat, fever.  She denies any chest pain, shortness of breath, nausea, vomiting.  Denies any known sick contacts.  She has tried Robitussin and Tylenol without improvement of symptoms.  She is up-to-date on immunizations including COVID-19 vaccination.  She denies any recent antibiotic use.  She denies history of allergies, asthma, smoking, COPD.  Reports she initially had improvement of symptoms and then had worsening within the past several days prompting evaluation today.   Past Medical History:  Diagnosis Date   Gestational diabetes    Migraine with aura    PUPP (pruritic urticarial papules and plaques of pregnancy)    Scoliosis    Vaginal yeast infection     Patient Active Problem List   Diagnosis Date Noted   Vaginal delivery 07/17/2019   Encounter for induction of labor 07/15/2019   Migraine 06/08/2019   Gestational diabetes mellitus (GDM) 05/02/2019   Hemoglobin Constant Spring trait 02/13/2019   GBS (group B streptococcus) UTI complicating pregnancy 65/06/5463   Supervision of high risk pregnancy, antepartum 01/01/2019   Scoliosis     Past Surgical History:  Procedure Laterality Date   NO PAST SURGERIES      OB History     Gravida  2   Para  1   Term  1   Preterm  0   AB  0   Living  1      SAB  0   IAB  0   Ectopic  0   Multiple  0   Live Births  1            Home Medications    Prior to Admission medications   Medication Sig Start Date End Date Taking?  Authorizing Provider  amoxicillin-clavulanate (AUGMENTIN) 875-125 MG tablet Take 1 tablet by mouth every 12 (twelve) hours. 12/28/20  Yes Gem Conkle, Derry Skill, PA-C  Blood Pressure Monitoring (BLOOD PRESSURE KIT) DEVI 1 Device by Does not apply route as needed. ICD 10:  O09.90 Patient not taking: Reported on 08/20/2019 01/01/19   Rasch, Artist Pais, NP    Family History Family History  Problem Relation Age of Onset   Migraines Mother    Diabetes Mother    Arthritis Mother    Hypotension Father     Social History Social History   Tobacco Use   Smoking status: Never   Smokeless tobacco: Never  Vaping Use   Vaping Use: Never used  Substance Use Topics   Alcohol use: No   Drug use: No     Allergies   Patient has no known allergies.   Review of Systems Review of Systems  Constitutional:  Positive for activity change, fatigue and fever. Negative for appetite change.  HENT:  Positive for congestion and sore throat. Negative for sinus pressure and sneezing.   Respiratory:  Positive for cough. Negative for shortness of breath.   Cardiovascular:  Negative for chest pain.  Gastrointestinal:  Negative for abdominal pain, diarrhea,  nausea and vomiting.  Musculoskeletal:  Positive for arthralgias and myalgias.  Neurological:  Negative for dizziness, light-headedness and headaches.    Physical Exam Triage Vital Signs ED Triage Vitals  Enc Vitals Group     BP 12/28/20 1911 124/87     Pulse Rate 12/28/20 1911 96     Resp 12/28/20 1911 18     Temp 12/28/20 1911 (!) 102.4 F (39.1 C)     Temp Source 12/28/20 1911 Oral     SpO2 12/28/20 1911 95 %     Weight --      Height --      Head Circumference --      Peak Flow --      Pain Score 12/28/20 1909 0     Pain Loc --      Pain Edu? --      Excl. in [place]? --    No data found.  Updated Vital Signs BP 124/87 (BP Location: Right Arm)   Pulse 96   Temp (!) 100.7 F (38.2 C) (Oral)   Resp 18   SpO2 95%   Visual Acuity Right Eye  Distance:   Left Eye Distance:   Bilateral Distance:    Right Eye Near:   Left Eye Near:    Bilateral Near:     Physical Exam Vitals reviewed.  Constitutional:      General: She is awake. She is not in acute distress.    Appearance: Normal appearance. She is well-developed. She is not ill-appearing.     Comments: Very pleasant female appears stated age no acute distress sitting comfortably in exam room  HENT:     Head: Normocephalic and atraumatic.     Right Ear: Ear canal and external ear normal. A middle ear effusion is present. Tympanic membrane is not erythematous or bulging.     Left Ear: Ear canal and external ear normal. A middle ear effusion is present. Tympanic membrane is not erythematous or bulging.     Nose:     Right Sinus: No maxillary sinus tenderness or frontal sinus tenderness.     Left Sinus: No maxillary sinus tenderness or frontal sinus tenderness.     Mouth/Throat:     Pharynx: Uvula midline. Posterior oropharyngeal erythema present. No oropharyngeal exudate.     Comments: Erythema and drainage noted posterior pharynx Cardiovascular:     Rate and Rhythm: Regular rhythm. Tachycardia present.     Heart sounds: Normal heart sounds, S1 normal and S2 normal. No murmur heard. Pulmonary:     Effort: Pulmonary effort is normal.     Breath sounds: Rhonchi present. No wheezing or rales.     Comments: Scattered rhonchi clear with cough Psychiatric:        Behavior: Behavior is cooperative.     UC Treatments / Results  Labs (all labs ordered are listed, but only abnormal results are displayed) Labs Reviewed - No data to display  EKG   Radiology DG Chest 2 View  Result Date: 12/28/2020 CLINICAL DATA:  Cough and fever EXAM: CHEST - 2 VIEW COMPARISON:  None. FINDINGS: The heart size and mediastinal contours are within normal limits. Both lungs are clear. The visualized skeletal structures are unremarkable. IMPRESSION: No active cardiopulmonary disease.  Electronically Signed   By: Ulyses Jarred M.D.   On: 12/28/2020 19:50    Procedures Procedures (including critical care time)  Medications Ordered in UC Medications  acetaminophen (TYLENOL) tablet 975 mg (975 mg Oral Given 12/28/20 1954)  Initial Impression / Assessment and Plan / UC Course  I have reviewed the triage vital signs and the nursing notes.  Pertinent labs & imaging results that were available during my care of the patient were reviewed by me and considered in my medical decision making (see chart for details).      No indication for viral testing given patient has been symptomatic for 2 weeks.  She was started on Augmentin and given prolonged and worsening symptoms.  X-ray obtained showed no acute abnormalities so no additional antibiotics were prescribed.  She was encouraged to use over-the-counter medications including Tylenol, Flonase, Mucinex for symptom relief.  Recommended she rest and drink plenty of fluid.  Discussed at length alarm symptoms that warrant emergent evaluation.  Encouraged her to follow-up with primary care within a week to ensure symptom improvement.  Strict return precautions given to which she expressed understanding.  Final Clinical Impressions(s) / UC Diagnoses   Final diagnoses:  Sinobronchitis  Cough  Fever, unspecified     Discharge Instructions      Your x-ray was normal.  We will start Augmentin given you have had symptoms for several weeks.  This will cover for pneumonia and bronchitis.  Use over-the-counter medications including Tylenol and ibuprofen for fever and pain.  If you have any worsening symptoms including persistent symptoms, worsening cough, high fever not responding to medication, shortness of breath, chest pain, nausea/vomiting interfering with oral intake you need to be reevaluated.  I would recommend following up with your primary care provider within a week to ensure symptom improvement.     ED Prescriptions      Medication Sig Dispense Auth. Provider   amoxicillin-clavulanate (AUGMENTIN) 875-125 MG tablet Take 1 tablet by mouth every 12 (twelve) hours. 14 tablet Jazzalyn Loewenstein, Derry Skill, PA-C      PDMP not reviewed this encounter.   Terrilee Croak, PA-C 12/28/20 2035

## 2020-12-28 NOTE — ED Triage Notes (Signed)
Pt reports fever 102.1 F and body aches x 3 days. Pt taking Tylenol last dose, this morning.

## 2020-12-28 NOTE — Discharge Instructions (Addendum)
Your x-ray was normal.  We will start Augmentin given you have had symptoms for several weeks.  This will cover for pneumonia and bronchitis.  Use over-the-counter medications including Tylenol and ibuprofen for fever and pain.  If you have any worsening symptoms including persistent symptoms, worsening cough, high fever not responding to medication, shortness of breath, chest pain, nausea/vomiting interfering with oral intake you need to be reevaluated.  I would recommend following up with your primary care provider within a week to ensure symptom improvement.

## 2020-12-30 ENCOUNTER — Telehealth: Payer: Medicaid Other

## 2021-01-25 ENCOUNTER — Encounter: Payer: Self-pay | Admitting: Registered Nurse

## 2021-03-18 ENCOUNTER — Encounter: Payer: Self-pay | Admitting: Registered Nurse

## 2021-07-31 ENCOUNTER — Encounter: Payer: Self-pay | Admitting: Registered Nurse

## 2021-08-01 ENCOUNTER — Telehealth: Payer: Self-pay | Admitting: *Deleted

## 2021-08-01 MED ORDER — BORIC ACID CRYS: 600.0000 mg | CRYSTALS | Freq: Every day | 5 refills | Status: DC

## 2021-08-01 NOTE — Telephone Encounter (Signed)
Received a voice message from [facility] they are trying to reach Dr. Adrian Blackwater re: refill of boric acid.I called and they clarified patient had requested refill of boric acid. I informed them I will forward to Dr. For approval. Per chart last office visit 5/ 6/21.  ?Nancy Fetter ?

## 2021-08-01 NOTE — Telephone Encounter (Signed)
Called and spoke with patient to let her know she would need a appointment from either provider because she has not been seen in over a year ?

## 2021-08-01 NOTE — Telephone Encounter (Signed)
Refilled. Pt should have an annual exam scheduled. ?

## 2021-08-02 ENCOUNTER — Encounter: Payer: Self-pay | Admitting: *Deleted

## 2021-08-03 ENCOUNTER — Other Ambulatory Visit: Payer: Self-pay | Admitting: Family Medicine

## 2021-08-03 MED ORDER — BORIC ACID VAGINAL 600 MG VA SUPP: 1.0000 | VAGINAL | 3 refills | Status: DC

## 2021-11-10 ENCOUNTER — Encounter (HOSPITAL_COMMUNITY): Payer: Self-pay | Admitting: Emergency Medicine

## 2021-11-10 ENCOUNTER — Ambulatory Visit (HOSPITAL_COMMUNITY)
Admission: EM | Admit: 2021-11-10 | Discharge: 2021-11-10 | Disposition: A | Discharge status: Home / Self Care | Payer: Medicaid Other | Attending: Family Medicine | Admitting: Family Medicine

## 2021-11-10 DIAGNOSIS — Z1152 Encounter for screening for COVID-19: Secondary | ICD-10-CM | POA: Insufficient documentation

## 2021-11-10 DIAGNOSIS — R509 Fever, unspecified: Secondary | ICD-10-CM | POA: Diagnosis present

## 2021-11-10 DIAGNOSIS — B349 Viral infection, unspecified: Secondary | ICD-10-CM | POA: Diagnosis not present

## 2021-11-10 LAB — POCT RAPID STREP A, ED / UC: Streptococcus, Group A Screen (Direct): NEGATIVE

## 2021-11-10 MED ORDER — LIDOCAINE VISCOUS HCL 2 % MT SOLN: 15.0000 mL | OROMUCOSAL | 0 refills | Status: DC | PRN

## 2021-11-10 NOTE — ED Triage Notes (Signed)
Since Tuesday started having body aches, chills at night. Wake up with fevers and sweating. Take tylenol and nyquil some. C/o sore throat and Head pressure as well.  Went to [place] last Friday.

## 2021-11-10 NOTE — Discharge Instructions (Addendum)
COVID test pending. Rapid strep is negative. Throat culture is pending. Lidocaine viscous prescribed for throat pain. Continue Tylenol and Ibuprofen for fevers, headache, throat pain and generalized body aches. Your COVID results should result within the next 6 to 12 hours directly to MyChart.  If your COVID test is positive you will need to remain out of work and return on August 2nd. If you need a work note you can contact our office and we can send you a work note via Clinical cytogeneticist. Continue to hydrate well with fluids and rest. If your COVID is negative you likely have a viral illness which will typically resolve with symptom management within 5 to 7 days.

## 2021-11-10 NOTE — ED Provider Notes (Signed)
[place]    CSN: [id number] Arrival date & time: 11/10/21  4854      History   Chief Complaint Chief Complaint  Patient presents with   Generalized Body Aches   Chills    HPI Terri Combs is a 26 y.o. female.   HPI Patient presents today with a 4-day history of generalized body aches, chills, sore throat, headache and fever. Fever Tmax is 101.  She has been taking Tylenol for management of fever.  She reports her son had similar illness which resolved without treatment.  He tested negative for flu and COVID.  She denies any coughing or nasal congestion.  She traveled out of state last week however is unaware of any known direct sick contacts.  Past Medical History:  Diagnosis Date   Gestational diabetes    Migraine with aura    PUPP (pruritic urticarial papules and plaques of pregnancy)    Scoliosis    Vaginal yeast infection     Patient Active Problem List   Diagnosis Date Noted   Vaginal delivery 07/17/2019   Encounter for induction of labor 07/15/2019   Migraine 06/08/2019   Gestational diabetes mellitus (GDM) 05/02/2019   Hemoglobin Constant Spring trait 02/13/2019   GBS (group B streptococcus) UTI complicating pregnancy 62/70/3500   Supervision of high risk pregnancy, antepartum 01/01/2019   Scoliosis     Past Surgical History:  Procedure Laterality Date   NO PAST SURGERIES      OB History     Gravida  2   Para  1   Term  1   Preterm  0   AB  0   Living  1      SAB  0   IAB  0   Ectopic  0   Multiple  0   Live Births  1            Home Medications    Prior to Admission medications   Medication Sig Start Date End Date Taking? Authorizing Provider  lidocaine (XYLOCAINE) 2 % solution Use as directed 15 mLs in the mouth or throat every 3 (three) hours as needed for mouth pain (Mix with warm water gargle and spit). 11/10/21  Yes Scot Jun, FNP  Blood Pressure Monitoring (BLOOD PRESSURE KIT) DEVI 1 Device by Does not  apply route as needed. ICD 10:  O09.90 Patient not taking: Reported on 08/20/2019 01/01/19   Rasch, Anderson Malta I, NP  Boric Acid [facility] Place 600 mg vaginally at bedtime. Use vaginally every night for two weeks then twice a week 08/01/21   Truett Mainland, DO  Boric Acid Vaginal 600 MG SUPP Place 1 capsule vaginally See admin instructions. Place vaginally once a night for 2 weeks, then 3 times weekly. 08/03/21   Truett Mainland, DO    Family History Family History  Problem Relation Age of Onset   Migraines Mother    Diabetes Mother    Arthritis Mother    Hypotension Father     Social History Social History   Tobacco Use   Smoking status: Never   Smokeless tobacco: Never  Vaping Use   Vaping Use: Never used  Substance Use Topics   Alcohol use: No   Drug use: No     Allergies   Patient has no known allergies.   Review of Systems Review of Systems Pertinent negatives listed in HPI   Physical Exam Triage Vital Signs ED Triage Vitals  Enc Vitals Group  BP 11/10/21 0853 129/90     Pulse Rate 11/10/21 0853 100     Resp 11/10/21 0853 17     Temp 11/10/21 0853 99.4 F (37.4 C)     Temp Source 11/10/21 0853 Oral     SpO2 11/10/21 0853 98 %     Weight --      Height --      Head Circumference --      Peak Flow --      Pain Score 11/10/21 0851 4     Pain Loc --      Pain Edu? --      Excl. in [place]? --    No data found.  Updated Vital Signs BP 129/90 (BP Location: Right Arm)   Pulse 100   Temp 99.4 F (37.4 C) (Oral)   Resp 17   LMP 10/16/2021   SpO2 98%   Visual Acuity Right Eye Distance:   Left Eye Distance:   Bilateral Distance:    Right Eye Near:   Left Eye Near:    Bilateral Near:     Physical Exam Vitals reviewed.  Constitutional:      General: She is not in acute distress.    Appearance: She is ill-appearing. She is not toxic-appearing.  HENT:     Head: Normocephalic and atraumatic.     Nose: Nose normal.     Mouth/Throat:     Pharynx:  Posterior oropharyngeal erythema present.  Eyes:     Extraocular Movements: Extraocular movements intact.     Pupils: Pupils are equal, round, and reactive to light.  Cardiovascular:     Rate and Rhythm: Normal rate and regular rhythm.  Pulmonary:     Effort: Pulmonary effort is normal.     Breath sounds: Normal breath sounds.  Musculoskeletal:     Cervical back: Normal range of motion.  Lymphadenopathy:     Cervical: Cervical adenopathy present.  Skin:    General: Skin is warm and dry.     Capillary Refill: Capillary refill takes less than 2 seconds.  Neurological:     General: No focal deficit present.     Mental Status: She is alert and oriented to person, place, and time.  Psychiatric:        Mood and Affect: Mood normal.        Behavior: Behavior normal.      UC Treatments / Results  Labs (all labs ordered are listed, but only abnormal results are displayed) Labs Reviewed  SARS CORONAVIRUS 2 (TAT 6-24 HRS)  CULTURE, GROUP A STREP [facility])  POCT RAPID STREP A, ED / UC    EKG   Radiology No results found.  Procedures Procedures (including critical care time)  Medications Ordered in UC Medications - No data to display  Initial Impression / Assessment and Plan / UC Course  I have reviewed the triage vital signs and the nursing notes.  Pertinent labs & imaging results that were available during my care of the patient were reviewed by me and considered in my medical decision making (see chart for details).    COVID test pending. Rapid strep negative, throat culture pending, Recommendations per discharge instructions/medication orders. Return if symptoms have not resolved within 7 days or anytime if symptoms worsen. Final Clinical Impressions(s) / UC Diagnoses   Final diagnoses:  Encounter for screening for COVID-19  Viral illness  Febrile illness     Discharge Instructions      COVID test pending. Rapid strep is  negative. Throat culture is pending.  Lidocaine viscous prescribed for throat pain. Continue Tylenol and Ibuprofen for fevers, headache, throat pain and generalized body aches. Your COVID results should result within the next 6 to 12 hours directly to MyChart.  If your COVID test is positive you will need to remain out of work and return on August 2nd. If you need a work note you can contact our office and we can send you a work note via Pharmacist, community. Continue to hydrate well with fluids and rest. If your COVID is negative you likely have a viral illness which will typically resolve with symptom management within 5 to 7 days.     ED Prescriptions     Medication Sig Dispense Auth. Provider   lidocaine (XYLOCAINE) 2 % solution Use as directed 15 mLs in the mouth or throat every 3 (three) hours as needed for mouth pain (Mix with warm water gargle and spit). 100 mL Scot Jun, FNP      PDMP not reviewed this encounter.   Scot Jun, FNP 11/10/21 1018

## 2021-11-11 LAB — SARS CORONAVIRUS 2 (TAT 6-24 HRS): SARS Coronavirus 2: NEGATIVE

## 2021-11-13 LAB — CULTURE, GROUP A STREP (THRC)

## 2021-12-16 IMAGING — DX DG CHEST 2V
2 series · 2 of 2 positions shown · non-contrast
Comparison: None.

CLINICAL DATA: Cough and fever

EXAM:
CHEST - 2 VIEW

[chest pa]
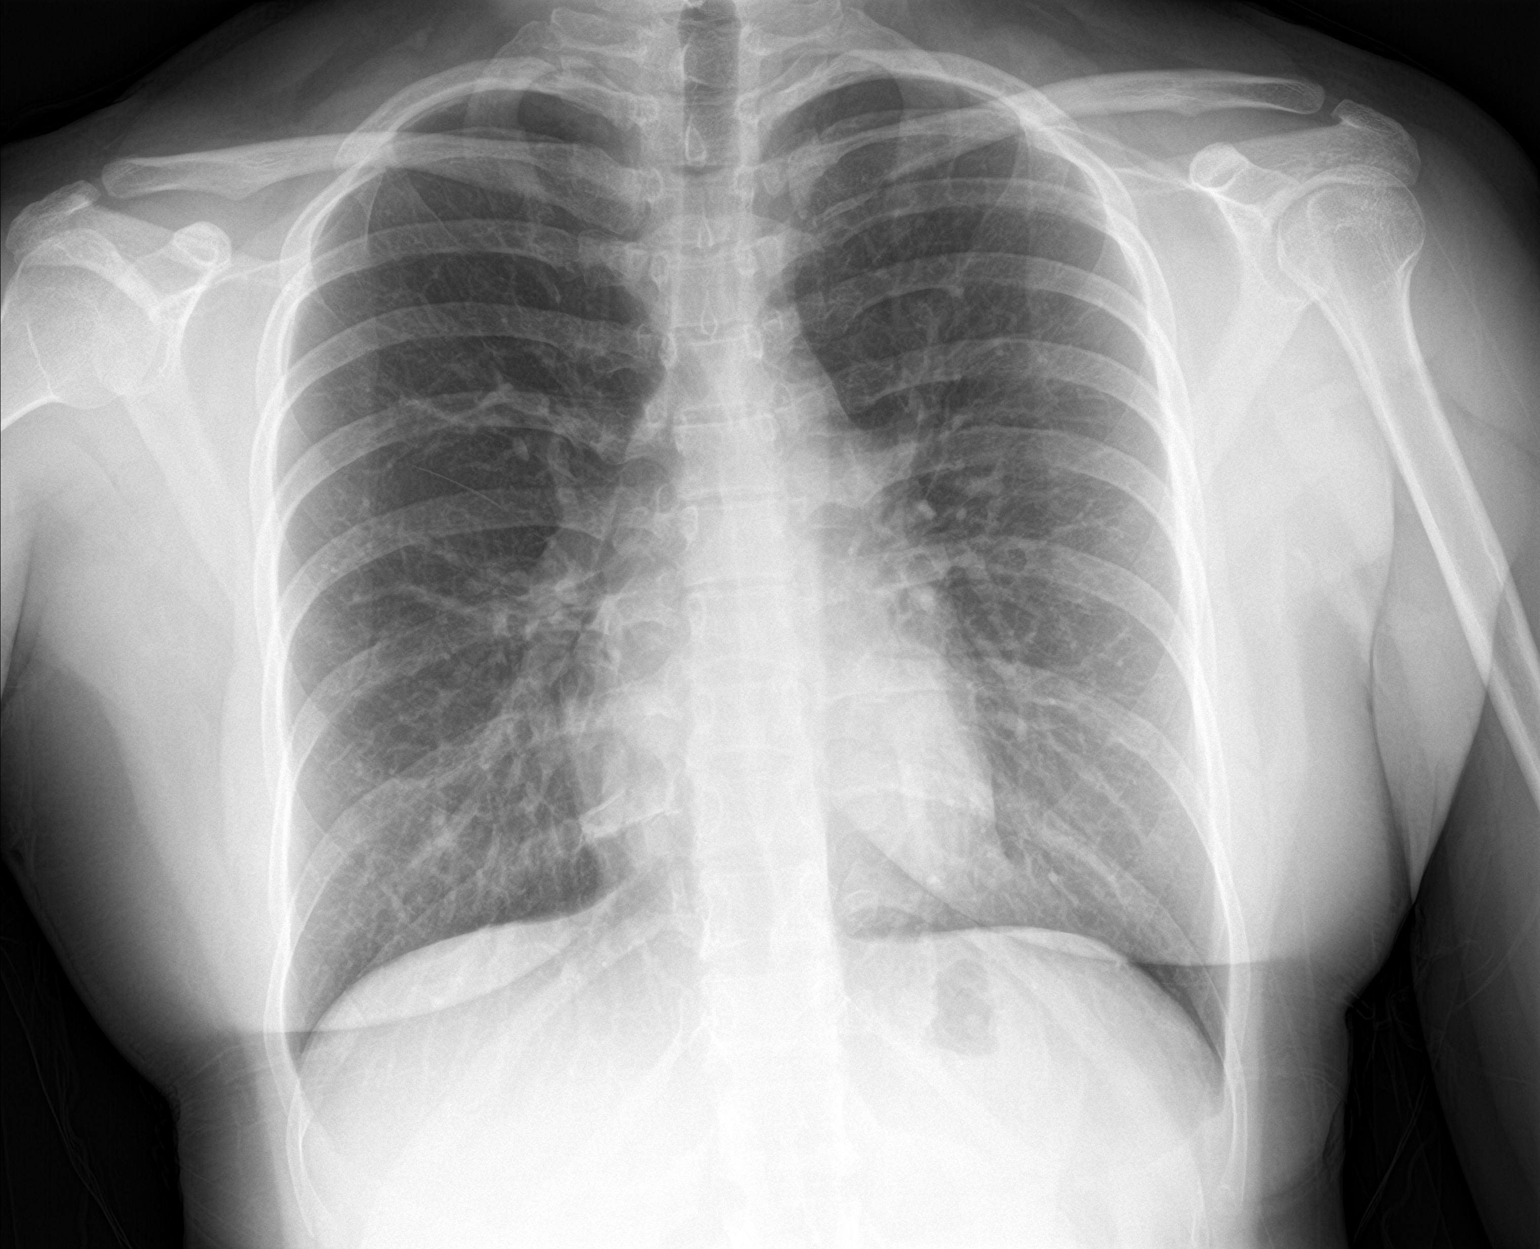

[chest lat]
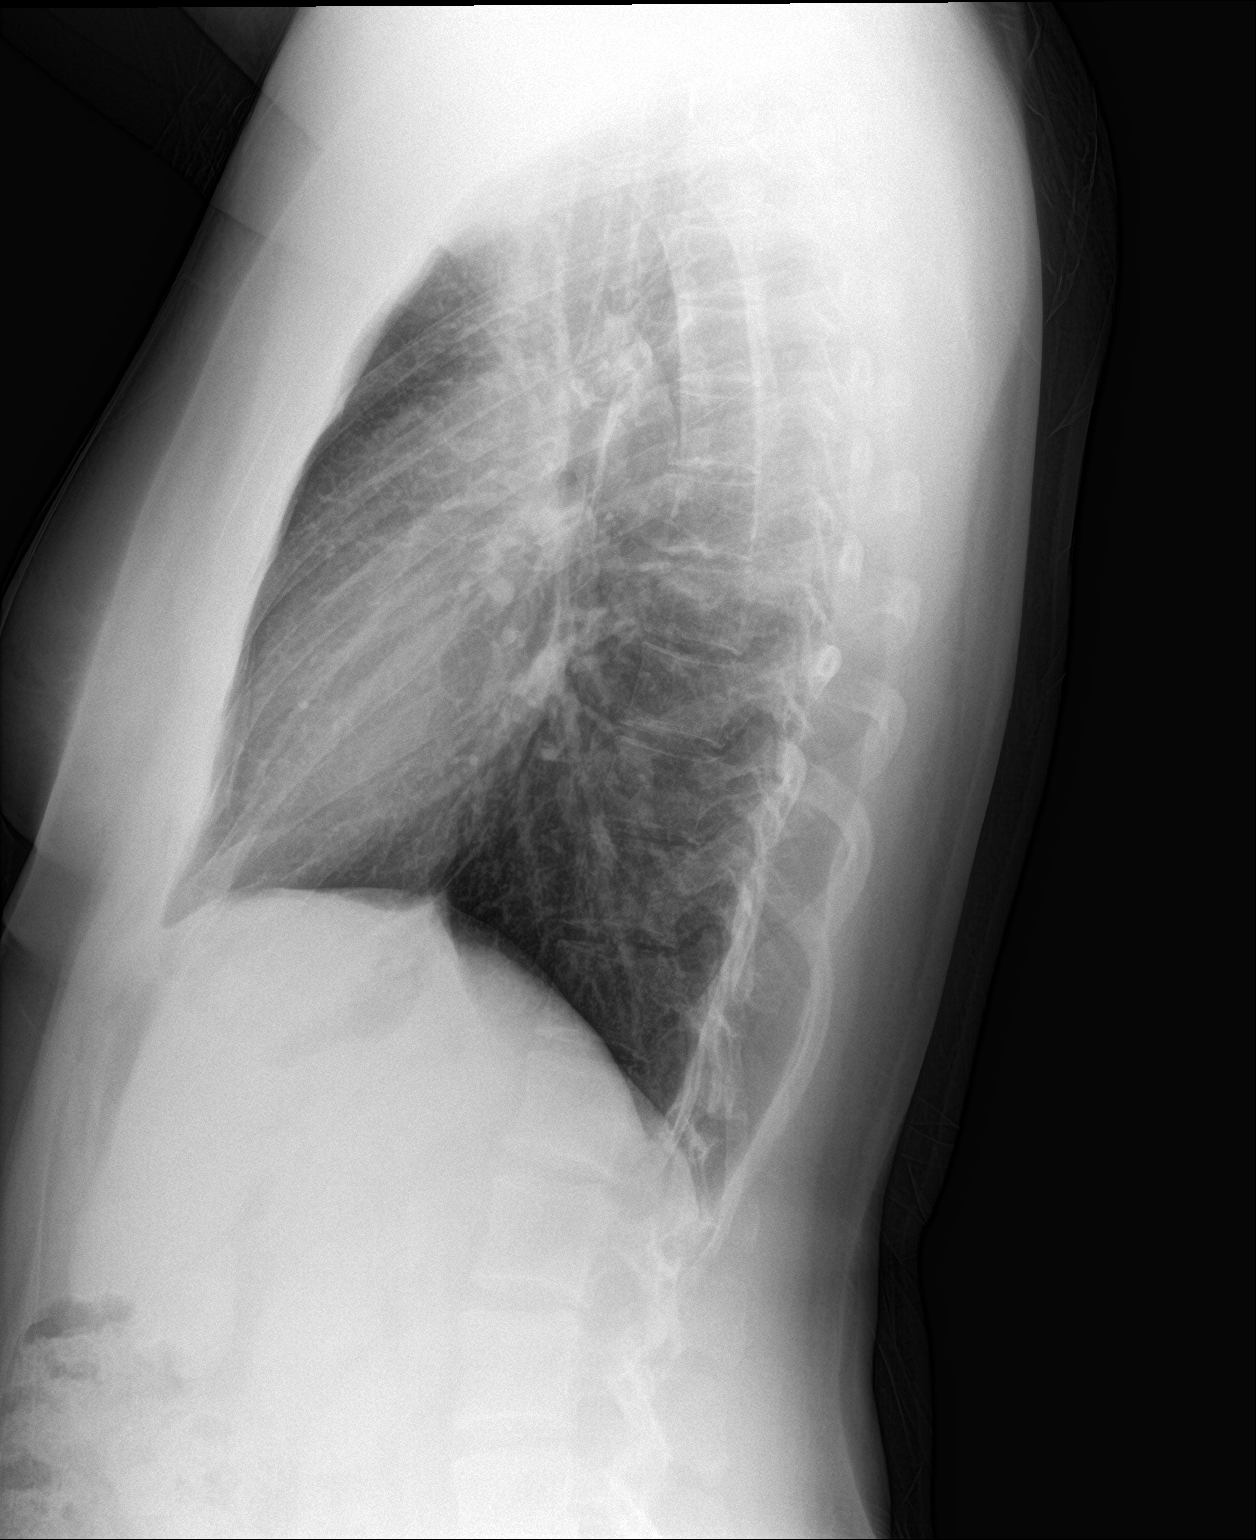

[2 of 2 positions shown; findings below may reference images not displayed]

FINDINGS: The heart size and mediastinal contours are within normal limits.
Both lungs are clear. The visualized skeletal structures are
unremarkable.
IMPRESSION: No active cardiopulmonary disease.

## 2022-04-16 NOTE — L&D Delivery Note (Signed)
Delivery Note    Patient Name: Terri Combs DOB: 08/31/1995 MRN: [id number]  Date of admission: 03/09/2023 Delivering MD: Dale Minneola Date of delivery: 03/10/2023 Type of delivery: SVD  Newborn Data: Live born female  Birth Weight:   APGAR: ,   Newborn Delivery   Birth date/time: 03/10/2023 13:47:00 Delivery type: Vaginal, Spontaneous    Terri Combs, 27 y.o., @ [redacted]w[redacted]d,  G2P1001, who was admitted for PPROM and preterm labor, gbs+ tx with 3 doses pcn, dx with yeast infection plan to treat PP, GDMA1 cbg wnl, no meds. Progressed with pitocin to fully dilated and just now comfortable with epidural. I was called to the room when she progressed +3 station in the second stage of labor.  She pushed for 1/min.  She delivered a viable infant, cephalic and restituted to the LOA position over an intact perineum.  A nuchal cord   was identified, loose and reduced. The baby was placed on maternal abdomen while initial step of NRP were perfmored (Dry, Stimulated, and warmed). Hat placed on baby for thermoregulation. Delayed cord clamping was performed for 3 minutes.  Cord double clamped and cut.  Cord cut by FOB. Apgar scores were 9 and 9. Prophylactic Pitocin was started in the third stage of labor for active management. The placenta delivered spontaneously, shultz, with a 3 vessel cord and was sent to Pathology.  Inspection revealed none. An examination of the vaginal vault and cervix was free from lacerations. The uterus was firm, bleeding stable. Slight brisk bleeding noted and cath was not placed r/t fast progression, red robin placed and obtained, TXA started, pt tolerated well.   Umbilical artery blood gas were not sent.  There were no complications during the procedure.  Mom and baby skin to skin following delivery. Left in stable condition.  Maternal Info: Anesthesia: Epidural Episiotomy: no Lacerations:  no Suture Repair: no Est. Blood Loss (mL):   Newborn Info:  Baby Sex:  female Circumcision: N/A Babies Name: ??? Maybe Dia Sitter, Sophia, Halley Kincer APGAR (1 MIN):   APGAR (5 MINS):   APGAR (10 MINS):     Mom to postpartum.  Baby to Couplet care / Skin to Skin.  Delivery Report:    Review the Delivery Report for details.   [facility] CNM, FNP-C, PMHNP-BC  [address]  [place], [place] [address]  Cell: [phone]  Office Phone: [phone] Fax: [phone] 03/10/2023  2:10 PM

## 2022-08-15 ENCOUNTER — Ambulatory Visit (INDEPENDENT_AMBULATORY_CARE_PROVIDER_SITE_OTHER): Payer: 59

## 2022-08-15 DIAGNOSIS — Z3201 Encounter for pregnancy test, result positive: Secondary | ICD-10-CM

## 2022-08-15 LAB — POCT PREGNANCY, URINE: Preg Test, Ur: POSITIVE — AB

## 2022-08-15 NOTE — Progress Notes (Signed)
Pt left urine for pregnancy test resulting positive.  Pt reports mild menstrual like period and has only had some vaginal spotting.  Pt reports LMP 07/05/22, EDD 04/11/23, and 5w 6d.  Medications and allergies reviewed.  Pt advised when to go to MAU for evaluation/.  Pt encouraged to start [facility] and start taking PNV.  Pt verbalized understanding with no further questions.   Leonette Nutting  08/15/22

## 2022-08-22 ENCOUNTER — Institutional Professional Consult (permissible substitution): Payer: PRIVATE HEALTH INSURANCE | Admitting: Neurology

## 2022-08-29 ENCOUNTER — Telehealth: Payer: Self-pay | Admitting: Family Medicine

## 2022-08-29 NOTE — Telephone Encounter (Signed)
Patient has been spotting with pregnancy, would like a call back from nurse

## 2022-08-30 NOTE — Telephone Encounter (Signed)
Called patient at number listed in chart--[phone]--and identified patient with name and DOB.   Patient reported spotting that lasted 2 days and has resolved since last night; spotting was light and pink in color. Patient now reports only having increased clear, thin discharge. Patient denied any vaginal pain, only mild cramping.   Discussed with patient how spotting can occur earlier I pregnancy, and advised patient to go to MAU if any vaginal bleeding (like a period) and/or moderate-to-severe abdominal pain occurs. Also informed patient sometimes intercourse can cause vaginal bleeding/spotting, and that if that occurs to also head to MAU. Told patient that if she still had any doubts or concerns, that she can always try to be seen in office and/or head to MAU.   Patient verbalized understanding and had no further questions.   Maureen Ralphs RN on 08/30/22 at [phone]

## 2022-09-20 ENCOUNTER — Inpatient Hospital Stay (HOSPITAL_COMMUNITY)
Admission: AD | Admit: 2022-09-20 | Discharge: 2022-09-21 | Disposition: A | Discharge status: Home / Self Care | Payer: 59 | Attending: Obstetrics and Gynecology | Admitting: Obstetrics and Gynecology

## 2022-09-20 ENCOUNTER — Encounter (HOSPITAL_COMMUNITY): Payer: Self-pay | Admitting: *Deleted

## 2022-09-20 DIAGNOSIS — O26891 Other specified pregnancy related conditions, first trimester: Secondary | ICD-10-CM | POA: Diagnosis present

## 2022-09-20 DIAGNOSIS — R112 Nausea with vomiting, unspecified: Secondary | ICD-10-CM | POA: Diagnosis not present

## 2022-09-20 DIAGNOSIS — Z3A11 11 weeks gestation of pregnancy: Secondary | ICD-10-CM | POA: Diagnosis not present

## 2022-09-20 DIAGNOSIS — K529 Noninfective gastroenteritis and colitis, unspecified: Secondary | ICD-10-CM | POA: Insufficient documentation

## 2022-09-20 DIAGNOSIS — O219 Vomiting of pregnancy, unspecified: Secondary | ICD-10-CM | POA: Insufficient documentation

## 2022-09-20 DIAGNOSIS — E860 Dehydration: Secondary | ICD-10-CM | POA: Diagnosis not present

## 2022-09-20 DIAGNOSIS — R197 Diarrhea, unspecified: Secondary | ICD-10-CM | POA: Diagnosis not present

## 2022-09-20 LAB — OB RESULTS CONSOLE HEPATITIS B SURFACE ANTIGEN: Hepatitis B Surface Ag: NEGATIVE

## 2022-09-20 LAB — URINALYSIS, ROUTINE W REFLEX MICROSCOPIC
Bacteria, UA: NONE SEEN
Bilirubin Urine: NEGATIVE
Glucose, UA: NEGATIVE mg/dL
Hgb urine dipstick: NEGATIVE
Ketones, ur: 80 mg/dL — AB
Leukocytes,Ua: NEGATIVE
Nitrite: NEGATIVE
Protein, ur: 30 mg/dL — AB
Specific Gravity, Urine: 1.028 (ref 1.005–1.030)
pH: 6 (ref 5.0–8.0)

## 2022-09-20 LAB — OB RESULTS CONSOLE RUBELLA ANTIBODY, IGM: Rubella: IMMUNE

## 2022-09-20 LAB — HEPATITIS C ANTIBODY: HCV Ab: NEGATIVE

## 2022-09-20 LAB — OB RESULTS CONSOLE HIV ANTIBODY (ROUTINE TESTING): HIV: NONREACTIVE

## 2022-09-20 MED ORDER — ONDANSETRON HCL 4 MG/2ML IJ SOLN
4.0000 mg | Freq: Once | INTRAMUSCULAR | Status: AC
  Administered 2022-09-20: 4 mg via INTRAVENOUS
  Filled 2022-09-20: qty 2

## 2022-09-20 MED ORDER — LACTATED RINGERS IV SOLN: Freq: Once | INTRAVENOUS | Status: AC

## 2022-09-20 NOTE — MAU Provider Note (Signed)
Chief Complaint: Emesis During Pregnancy and Diarrhea   Event Date/Time   First Provider Initiated Contact with Patient 09/20/22 2335        SUBJECTIVE HPI: Terri Combs is a 27 y.o. G3P1001 at [redacted]w[redacted]d by LMP who presents to maternity admissions reporting nausea, vomiting and diarrhea today.  Thinks it was due to tuna sub she ate today  No fever or bleeding.. She denies vaginal bleeding, vaginal itching/burning, urinary symptoms, h/a, dizziness, or fever/chills.    Emesis  This is a new problem. The current episode started today. There has been no fever. Associated symptoms include abdominal pain and diarrhea. Pertinent negatives include no chest pain, chills, coughing, dizziness, fever or myalgias. Risk factors include suspect food intake. She has tried nothing for the symptoms.   RN note: Terri Combs is a 27 y.o. at [redacted]w[redacted]d here in MAU reporting n/v/d. Ate a tuna sub at lunch and soon afterward had nausea and then vomiting started. Has had watery diarrhea x 2 but none since 1500. Unable to keep down anything. Mild abdominal cramping  Onset of complaint: lunch today Pain score: 5  Past Medical History:  Diagnosis Date   Gestational diabetes    Migraine with aura    PUPP (pruritic urticarial papules and plaques of pregnancy)    Scoliosis    Vaginal yeast infection    Past Surgical History:  Procedure Laterality Date   NO PAST SURGERIES     Social History   Socioeconomic History   Marital status: Single    Spouse name: Not on file   Number of children: 0   Years of education: 55   Highest education level: Not on file  Occupational History   Occupation: [employer]  Tobacco Use   Smoking status: Never   Smokeless tobacco: Never  Vaping Use   Vaping Use: Never used  Substance and Sexual Activity   Alcohol use: No   Drug use: No   Sexual activity: Yes    Partners: Male    Birth control/protection: None  Other Topics Concern   Not on file  Social History Narrative   Lives at home w/ her  parents   Right-handed   Caffeine: soda daily   Social Determinants of Health   Financial Resource Strain: Not on file  Food Insecurity: Food Insecurity Present (03/14/2020)   Hunger Vital Sign    Worried About Running Out of Food in the Last Year: Sometimes true    Ran Out of Food in the Last Year: Never true  Transportation Needs: No Transportation Needs (10/28/2019)   PRAPARE - Administrator, Civil Service (Medical): No    Lack of Transportation (Non-Medical): No  Physical Activity: Not on file  Stress: Not on file  Social Connections: Not on file  Intimate Partner Violence: Not on file   No current facility-administered medications on file prior to encounter.   No current outpatient medications on file prior to encounter.   No Known Allergies  I have reviewed patient's Past Medical Hx, Surgical Hx, Family Hx, Social Hx, medications and allergies.   ROS:  Review of Systems  Constitutional:  Negative for chills and fever.  Respiratory:  Negative for cough.   Cardiovascular:  Negative for chest pain.  Gastrointestinal:  Positive for abdominal pain, diarrhea and vomiting.  Musculoskeletal:  Negative for myalgias.  Neurological:  Negative for dizziness.   Review of Systems  Other systems negative   Physical Exam  Physical Exam Patient Vitals for the past 24  hrs:  BP Temp Pulse Resp SpO2 Height Weight  09/20/22 2322 116/66 -- -- -- -- -- --  09/20/22 2321 -- 98.4 F (36.9 C) 95 17 100 % 5\' 1"  (1.549 m) 68.5 kg   Constitutional: Well-developed, well-nourished female in no acute distress.  Cardiovascular: normal rate Respiratory: normal effort GI: Abd soft, non-tender. Pos BS x 4 MS: Extremities nontender, no edema, normal ROM Neurologic: Alert and oriented x 4.  GU: Neg CVAT.  LAB RESULTS Results for orders placed or performed during the hospital encounter of 09/20/22 (from the past 24 hour(s))  Urinalysis, Routine w reflex microscopic -Urine, Clean  Catch     Status: Abnormal   Collection Time: 09/20/22 11:07 PM  Result Value Ref Range   Color, Urine YELLOW YELLOW   APPearance HAZY (A) CLEAR   Specific Gravity, Urine 1.028 1.005 - 1.030   pH 6.0 5.0 - 8.0   Glucose, UA NEGATIVE NEGATIVE mg/dL   Hgb urine dipstick NEGATIVE NEGATIVE   Bilirubin Urine NEGATIVE NEGATIVE   Ketones, ur 80 (A) NEGATIVE mg/dL   Protein, ur 30 (A) NEGATIVE mg/dL   Nitrite NEGATIVE NEGATIVE   Leukocytes,Ua NEGATIVE NEGATIVE   RBC / HPF 6-10 0 - 5 RBC/hpf   WBC, UA 0-5 0 - 5 WBC/hpf   Bacteria, UA NONE SEEN NONE SEEN   Squamous Epithelial / HPF 6-10 0 - 5 /HPF   Mucus PRESENT   CBC     Status: Abnormal   Collection Time: 09/20/22 11:56 PM  Result Value Ref Range   WBC 17.0 (H) 4.0 - 10.5 K/uL   RBC 4.71 3.87 - 5.11 MIL/uL   Hemoglobin 12.3 12.0 - 15.0 g/dL   HCT 04.5 40.9 - 81.1 %   MCV 80.5 80.0 - 100.0 fL   MCH 26.1 26.0 - 34.0 pg   MCHC 32.5 30.0 - 36.0 g/dL   RDW 91.4 78.2 - 95.6 %   Platelets 299 150 - 400 K/uL   nRBC 0.0 0.0 - 0.2 %  Comprehensive metabolic panel     Status: Abnormal   Collection Time: 09/20/22 11:56 PM  Result Value Ref Range   Sodium 134 (L) 135 - 145 mmol/L   Potassium 3.6 3.5 - 5.1 mmol/L   Chloride 102 98 - 111 mmol/L   CO2 19 (L) 22 - 32 mmol/L   Glucose, Bld 91 70 - 99 mg/dL   BUN 8 6 - 20 mg/dL   Creatinine, Ser 2.13 0.44 - 1.00 mg/dL   Calcium 8.8 (L) 8.9 - 10.3 mg/dL   Total Protein 6.9 6.5 - 8.1 g/dL   Albumin 3.4 (L) 3.5 - 5.0 g/dL   AST 19 15 - 41 U/L   ALT 14 0 - 44 U/L   Alkaline Phosphatase 38 38 - 126 U/L   Total Bilirubin 0.9 0.3 - 1.2 mg/dL   GFR, Estimated >08 >65 mL/min   Anion gap 13 5 - 15     IMAGING Informal bedside US done to assess fetal well-being. Fetus active FHR 160  MAU Management/MDM: I have reviewed the triage vital signs and the nursing notes.   Pertinent labs & imaging results that were available during my care of the patient were reviewed by me and considered in my  medical decision making (see chart for details).      I have reviewed her medical records including past results, notes and treatments. Medical, Surgical, and family history were reviewed.  Medications and recent lab tests were reviewed  Ordered  labs and IV hydration.  Labs normal    Treatments in MAU included IV hydration, Zofran.  She felt better after 2 liters of fluids and med.  Able to tolerate PO intake  ASSESSMENT Single IUP at [redacted]w[redacted]d Nausea, vomiting and diarrhea Gastroenteritis  PLAN Discharge home Rx Zofran for nausea Advance diet as tolerated  Pt stable at time of discharge. Encouraged to return here if she develops worsening of symptoms, increase in pain, fever, or other concerning symptoms.    Wynelle Bourgeois CNM, MSN Certified Nurse-Midwife 09/20/2022  11:35 PM

## 2022-09-20 NOTE — MAU Note (Signed)
.  Terri Combs is a 27 y.o. at [redacted]w[redacted]d here in MAU reporting n/v/d. Ate a tuna sub at lunch and soon afterward had nausea and then vomiting started. Has had watery diarrhea x 2 but none since 1500. Unable to keep down anything. Mild abdominal cramping  Onset of complaint: lunch today Pain score: 5 Vitals:   09/20/22 2321 09/20/22 2322  BP:  116/66  Pulse: 95   Resp: 17   Temp: 98.4 F (36.9 C)   SpO2: 100%      FHT:did not listen in triage Lab orders placed from triage:  u/a

## 2022-09-21 ENCOUNTER — Encounter (HOSPITAL_COMMUNITY): Payer: Self-pay | Admitting: Obstetrics and Gynecology

## 2022-09-21 DIAGNOSIS — R112 Nausea with vomiting, unspecified: Secondary | ICD-10-CM | POA: Diagnosis not present

## 2022-09-21 DIAGNOSIS — E860 Dehydration: Secondary | ICD-10-CM | POA: Diagnosis not present

## 2022-09-21 DIAGNOSIS — R197 Diarrhea, unspecified: Secondary | ICD-10-CM | POA: Diagnosis not present

## 2022-09-21 DIAGNOSIS — Z3A11 11 weeks gestation of pregnancy: Secondary | ICD-10-CM | POA: Diagnosis not present

## 2022-09-21 DIAGNOSIS — O26891 Other specified pregnancy related conditions, first trimester: Secondary | ICD-10-CM | POA: Diagnosis not present

## 2022-09-21 LAB — CBC
HCT: 37.9 % (ref 36.0–46.0)
Hemoglobin: 12.3 g/dL (ref 12.0–15.0)
MCH: 26.1 pg (ref 26.0–34.0)
MCHC: 32.5 g/dL (ref 30.0–36.0)
MCV: 80.5 fL (ref 80.0–100.0)
Platelets: 299 10*3/uL (ref 150–400)
RBC: 4.71 MIL/uL (ref 3.87–5.11)
RDW: 12.9 % (ref 11.5–15.5)
WBC: 17 10*3/uL — ABNORMAL HIGH (ref 4.0–10.5)
nRBC: 0 % (ref 0.0–0.2)

## 2022-09-21 LAB — COMPREHENSIVE METABOLIC PANEL
ALT: 14 U/L (ref 0–44)
AST: 19 U/L (ref 15–41)
Albumin: 3.4 g/dL — ABNORMAL LOW (ref 3.5–5.0)
Alkaline Phosphatase: 38 U/L (ref 38–126)
Anion gap: 13 (ref 5–15)
BUN: 8 mg/dL (ref 6–20)
CO2: 19 mmol/L — ABNORMAL LOW (ref 22–32)
Calcium: 8.8 mg/dL — ABNORMAL LOW (ref 8.9–10.3)
Chloride: 102 mmol/L (ref 98–111)
Creatinine, Ser: 0.69 mg/dL (ref 0.44–1.00)
GFR, Estimated: >60 mL/min (ref >60)
Glucose, Bld: 91 mg/dL (ref 70–99)
Potassium: 3.6 mmol/L (ref 3.5–5.1)
Sodium: 134 mmol/L — ABNORMAL LOW (ref 135–145)
Total Bilirubin: 0.9 mg/dL (ref 0.3–1.2)
Total Protein: 6.9 g/dL (ref 6.5–8.1)

## 2022-09-21 MED ORDER — ONDANSETRON 4 MG PO TBDP: 4.0000 mg | ORAL_TABLET | Freq: Four times a day (QID) | ORAL | 0 refills | Status: DC | PRN

## 2022-09-21 MED ORDER — LACTATED RINGERS IV SOLN: Freq: Once | INTRAVENOUS | Status: AC

## 2022-09-24 LAB — OB RESULTS CONSOLE GC/CHLAMYDIA
Chlamydia: NEGATIVE
Neisseria Gonorrhea: NEGATIVE

## 2022-09-27 ENCOUNTER — Telehealth: Payer: PRIVATE HEALTH INSURANCE

## 2022-09-27 NOTE — Progress Notes (Signed)
Direct visit link sent via text message at 0815. Called pt at 0820 and VM left stating patient should join visit by 8:30 or will need to reschedule. Called pt second time at 0830 with no answer.   Marjo Bicker, RN 09/27/2022  8:15 AM

## 2022-10-05 ENCOUNTER — Encounter: Payer: Self-pay | Admitting: Family Medicine

## 2022-11-28 ENCOUNTER — Encounter: Payer: Self-pay | Admitting: Neurology

## 2022-11-28 ENCOUNTER — Ambulatory Visit: Payer: Medicaid Other | Admitting: Neurology

## 2022-11-28 VITALS — BP 110/68 | HR 85 | Ht 62.0 in | Wt 155.0 lb

## 2022-11-28 DIAGNOSIS — G43109 Migraine with aura, not intractable, without status migrainosus: Secondary | ICD-10-CM | POA: Diagnosis not present

## 2022-11-28 MED ORDER — RIZATRIPTAN BENZOATE 10 MG PO TBDP
10.0000 mg | ORAL_TABLET | ORAL | 11 refills | Status: DC | PRN
Start: 2022-11-28 — End: 2023-03-09

## 2022-11-28 NOTE — Patient Instructions (Addendum)
At onset: rizatriptan and can also take zofran with it and throw in a tylenol if you want F/u end of january   Discussed:  There is increased risk for stroke in women with migraine with aura and a contraindication for the combined contraceptive pill for use by women who have migraine with aura. The risk for women with migraine without aura is lower. However other risk factors like smoking are far more likely to increase stroke risk than migraine. There is a recommendation for no smoking and for the use of OCPs without estrogen such as progestogen only pills particularly for women with migraine with aura.Marland Kitchen People who have migraine headaches with auras may be 3 times more likely to have a stroke caused by a blood clot, compared to migraine patients who don't see auras. Women who take hormone-replacement therapy may be 30 percent more likely to suffer a clot-based stroke than women not taking medication containing estrogen. Other risk factors like smoking and high blood pressure may be  much more important. And stroke is still a rare complication due to migraine aura and is controversial and lower doses may not cause a risk.

## 2022-11-28 NOTE — Progress Notes (Unsigned)
[facility]   Provider:  Dr Lucia Gaskins Referring Provider: Daria Pastures* Primary Care Physician:  Daria Pastures*   CC:  migraines  HPI : Here for migraines. We saw her in 2019 for similar and she no-showed follow up appointment. has Scoliosis; Hemoglobin Constant [place] trait; History of gestational diabetes; and Migraine on their problem list.  I reviewed [name] notes, she also has a history of vitamin D deficiency, fatigue, irregular periods.  [name]'s examination both physical and neurological were normal.  States she has not history of migraine with aura, previously given sumatriptan as needed, when she has migraine she goes into her dark room and rest and it resolves.  Her last migraine was 3 months ago in November 2023, prior to that she was having slightly more frequent migraines, she wanted to come back and be seen again.  It appears as though on June 08, 2022 they check CBC, CMP, lipid panel, vitamin D, hemoglobin A1c, thyroid, FSH and LH, estradiol, testosterone, prolactin but I do not have those results.  She still sees auras, zig zag lines, prisms, then she has the headache and blurry vision, this has been ongoing for years nothing new. Followed by the head pain, pulsating/pounding/throbbing, started in teens, mom has migraines, on both sidesof the head, associated dizziness, photophobia/phonophobia, a dark room and sleep helps, gets nausea, vomited once, hurts to move, sleeping helps, no food triggers, sleep quality can trigger or make it worse, can last all day, sumatriptan used to make it stop within an hour. Last month had 4 migraine days and usually 4-5 migraine days a month sometimes no episodes.   Reviewed notes, labs and imaging from outside physicians, which showed:  From a thorough review of records, medications tried that can be used in headache management for more than 3 months includes: Imitrex, almotriptan, Tylenol, Cambia,  Benadryl, ibuprofen, Toradol injections, Reglan tablets, Zofran, oxycodone, Phenergan, Maxalt,  Component 08/08/2021 02/08/2021      BUN 11 11  Creatinine 0.79 0.89  eGFR 106 92  BUN/Creatinine Ratio 14 12  Sodium 144 137  Potassium 4.2 4.6  Chloride 105 101  CO2 22 24  CALCIUM 9.0 9.5  Total Protein 7.2 7.7  Albumin, Serum 4.4 4.7  Globulin, Total 2.8 3.0  Albumin/Globulin Ratio 1.6 1.6  Total Bilirubin 0.4 0.6  Alkaline Phosphatase 71 80  AST 16 17  ALT (SGPT) 16 18  Glucose 94 76   TSH 0.484  CBC  [facility]04/25/2023 Component 08/08/2021 02/08/2021      WBC 8.4 6.9  RBC 5.32 High    5.65 High     Hemoglobin 13.7 14.5  Hematocrit 42.9 45.2  MCV 81 80  MCH 25.8 Low    25.7 Low     MCHC 31.9 32.1  RDW 14.0 13.5  Platelet Count 308 400   Patient complains of symptoms per HPI as well as the following symptoms: none . Pertinent negatives and positives per HPI. All others negative   01/07/2017: Migraines 1-2x a month. Maxalt did not work but Imitrex helps. Took an hour. She can't take it at work however because she is afraid of how it will affect her. The imitrex works in an hour. Infrequent migraines 1-2x a month. Discussed taking Cambia at work for acute management and can take it with imitrex as well.   HPI 3/19//2018 :  Terri Combs is a 27 y.o. female here as a referral from Dr. Tiburcio Pea for migraines. PMHx migraines. Started early  teens. Mom has migraines. They are pounding and throbbing on both sides, she has associated dizziness. Light and sound don't bother her. She recently changed birth control and the frequency has decreased. She can get a migraine twice a month and can last all day and be severe. She does get an aura sometimes. Advil has helped in the past. Napping helps. Decreased sleep can trigger them. No vision or sensory changes. She has neck stiffness. She has nausea when it is severe. No vomiting. No other focal neurologic deficits, associated symptoms,  inciting events or modifiable factors.   Reviewed notes, labs and imaging from outside physicians, which showed:   Reviewed labs CMP normal.    Review of Systems: Patient complains of symptoms per HPI as well as the following symptoms: no CP, no SOB. Pertinent negatives per HPI. All others negative.    Social History   Socioeconomic History   Marital status: Single    Spouse name: Not on file   Number of children: 0   Years of education: 12   Highest education level: Not on file  Occupational History   Occupation: [employer]  Tobacco Use   Smoking status: Never   Smokeless tobacco: Never  Vaping Use   Vaping status: Never Used  Substance and Sexual Activity   Alcohol use: No   Drug use: No   Sexual activity: Yes    Partners: Male    Birth control/protection: None  Other Topics Concern   Not on file  Social History Narrative   Lives at home w/ her parents   Right-handed   Caffeine: soda daily   Social Determinants of Health   Financial Resource Strain: Low Risk  (08/07/2021)   Received from [facility], [facility]   Overall Financial Resource Strain (CARDIA)    Difficulty of Paying Living Expenses: Not very hard  Food Insecurity: Patient Declined (08/07/2021)   Received from [facility], [facility]   Hunger Vital Sign    Worried About Running Out of Food in the Last Year: Patient declined    Ran Out of Food in the Last Year: Patient declined  Transportation Needs: No Transportation Needs (10/28/2019)   PRAPARE - Administrator, Civil Service (Medical): No    Lack of Transportation (Non-Medical): No  Physical Activity: Insufficiently Active (08/07/2021)   Received from [facility], [facility]   Exercise Vital Sign    Days of Exercise per Week: 4 days    Minutes of Exercise per Session: 20 min  Stress: No Stress Concern Present (08/07/2021)   Received from [facility], [facility] - Occupational  Stress Questionnaire    Feeling of Stress : Only a little  Social Connections: Unknown (08/24/2022)   Received from [facility], [facility]   Social Network    Social Network: Not on file  Intimate Partner Violence: Unknown (08/24/2022)   Received from [facility], [facility]   HITS    Physically Hurt: Not on file    Insult or Talk Down To: Not on file    Threaten Physical Harm: Not on file    Scream or Curse: Not on file    Family History  Problem Relation Age of Onset   Migraines Mother    Diabetes Mother    Arthritis Mother    Hypotension Father     Past Medical History:  Diagnosis Date   GBS (group B streptococcus) UTI complicating pregnancy 01/12/2019   Gestational  diabetes    Migraine with aura    PUPP (pruritic urticarial papules and plaques of pregnancy)    Scoliosis    Supervision of high risk pregnancy, antepartum 01/01/2019          Nursing Staff  Provider  Office Location   [facility]   Dating   First trimester [place]   Language    English  Anatomy US   WNL  Flu Vaccine   04/29/19  Genetic Screen   NIPS:  Low risk Female   AFP:  Needs at [place] visit   TDaP vaccine   06/29/19  Hgb A1C or   GTT     Third trimester   Glucose, Fasting  65 - 91 mg/dL  91   Glucose, 1 hour  65 - 179 mg/dL  [id number]    Glucose, 2 hour  65 - 152 mg/dL  308   Vaginal yeast infection     Past Surgical History:  Procedure Laterality Date   NO PAST SURGERIES      Current Outpatient Medications  Medication Sig Dispense Refill   Butalbital-APAP-Caffeine 50-300-40 MG CAPS Take 1 capsule by mouth every 6 (six) hours as needed.     ondansetron (ZOFRAN-ODT) 4 MG disintegrating tablet Take 1 tablet (4 mg total) by mouth every 6 (six) hours as needed for nausea. 20 tablet 0   Prenatal Vit-Fe Fumarate-FA (MULTIVITAMIN-PRENATAL) 27-0.8 MG TABS tablet Take 1 tablet by mouth daily at 12 noon.     rizatriptan (MAXALT-MLT) 10 MG disintegrating tablet Take 1 tablet (10 mg total) by mouth as needed for  migraine. May repeat in 2 hours if needed 9 tablet 11   No current facility-administered medications for this visit.    Allergies as of 11/28/2022   (No Known Allergies)    Vitals: BP 110/68   Pulse 85   Ht 5\' 2"  (1.575 m)   Wt 155 lb (70.3 kg)   LMP 07/05/2022   BMI 28.35 kg/m  Last Weight:  Wt Readings from Last 1 Encounters:  11/28/22 155 lb (70.3 kg)   Last Height:   Ht Readings from Last 1 Encounters:  11/28/22 5\' 2"  (1.575 m)   Physical exam: Exam: Gen: NAD, conversant, well nourised,  well groomed                     CV: RRR, no MRG. No Carotid Bruits. No peripheral edema, warm, nontender Eyes: Conjunctivae clear without exudates or hemorrhage  Neuro: Detailed Neurologic Exam  Speech:    Speech is normal; fluent and spontaneous with normal comprehension.  Cognition:    The patient is oriented to person, place, and time;     recent and remote memory intact;     language fluent;     normal attention, concentration,     fund of knowledge Cranial Nerves:    The pupils are equal, round, and reactive to light. The fundi are normal and spontaneous venous pulsations are present. Visual fields are full to finger confrontation. Extraocular movements are intact. Trigeminal sensation is intact and the muscles of mastication are normal. The face is symmetric. The palate elevates in the midline. Hearing intact. Voice is normal. Shoulder shrug is normal. The tongue has normal motion without fasciculations.   Coordination:    Normal finger to nose and heel to shin. Normal rapid alternating movements.   Gait:    Heel-toe and tandem gait are normal.   Motor Observation:    No asymmetry, no atrophy, and  no involuntary movements noted. Tone:    Normal muscle tone.    Posture:    Posture is normal. normal erect    Strength:    Strength is V/V in the upper and lower limbs.      Sensation: intact to LT     Reflex Exam:  DTR's:    Deep tendon reflexes in the upper  and lower extremities are normal bilaterally.   Toes:    The toes are downgoing bilaterally.   Clonus:    Clonus is absent.     Assessment/Plan:  27 year old with episodic migraines  At onset: rizatriptan and can also take zofran with it and throw in a tylenol if you want F/u end of january   Discussed:  There is increased risk for stroke in women with migraine with aura and a contraindication for the combined contraceptive pill for use by women who have migraine with aura. The risk for women with migraine without aura is lower. However other risk factors like smoking are far more likely to increase stroke risk than migraine. There is a recommendation for no smoking and for the use of OCPs without estrogen such as progestogen only pills particularly for women with migraine with aura.Marland Kitchen People who have migraine headaches with auras may be 3 times more likely to have a stroke caused by a blood clot, compared to migraine patients who don't see auras. Women who take hormone-replacement therapy may be 30 percent more likely to suffer a clot-based stroke than women not taking medication containing estrogen. Other risk factors like smoking and high blood pressure may be  much more important. And stroke is still a rare complication due to migraine aura and is controversial and lower doses may not cause a risk.  Naomie Dean, MD  [facility] [address] [place], [place] [address]  Phone [phone] Fax [phone]

## 2023-01-30 ENCOUNTER — Ambulatory Visit: Payer: Self-pay | Admitting: Dietician

## 2023-01-30 ENCOUNTER — Encounter: Payer: Self-pay | Admitting: Dietician

## 2023-03-09 ENCOUNTER — Inpatient Hospital Stay (HOSPITAL_COMMUNITY)
Admission: AD | Admit: 2023-03-09 | Discharge: 2023-03-12 | DRG: 805 | Disposition: A | Discharge status: Home / Self Care | Payer: Medicaid Other | Attending: Obstetrics and Gynecology | Admitting: Obstetrics and Gynecology

## 2023-03-09 ENCOUNTER — Inpatient Hospital Stay (HOSPITAL_COMMUNITY)
Admission: AD | Admit: 2023-03-09 | Discharge: 2023-03-09 | Disposition: A | Discharge status: Home / Self Care | Payer: Medicaid Other | Source: Home / Self Care | Attending: Obstetrics and Gynecology | Admitting: Obstetrics and Gynecology

## 2023-03-09 ENCOUNTER — Encounter (HOSPITAL_COMMUNITY): Payer: Self-pay | Admitting: Obstetrics and Gynecology

## 2023-03-09 ENCOUNTER — Other Ambulatory Visit: Payer: Self-pay

## 2023-03-09 DIAGNOSIS — O42913 Preterm premature rupture of membranes, unspecified as to length of time between rupture and onset of labor, third trimester: Principal | ICD-10-CM | POA: Diagnosis present

## 2023-03-09 DIAGNOSIS — Z3A36 36 weeks gestation of pregnancy: Secondary | ICD-10-CM

## 2023-03-09 DIAGNOSIS — O26893 Other specified pregnancy related conditions, third trimester: Secondary | ICD-10-CM | POA: Insufficient documentation

## 2023-03-09 DIAGNOSIS — Z7982 Long term (current) use of aspirin: Secondary | ICD-10-CM | POA: Diagnosis not present

## 2023-03-09 DIAGNOSIS — Z0371 Encounter for suspected problem with amniotic cavity and membrane ruled out: Secondary | ICD-10-CM | POA: Diagnosis not present

## 2023-03-09 DIAGNOSIS — Z148 Genetic carrier of other disease: Secondary | ICD-10-CM | POA: Diagnosis not present

## 2023-03-09 DIAGNOSIS — O98813 Other maternal infectious and parasitic diseases complicating pregnancy, third trimester: Secondary | ICD-10-CM | POA: Insufficient documentation

## 2023-03-09 DIAGNOSIS — B3731 Acute candidiasis of vulva and vagina: Secondary | ICD-10-CM

## 2023-03-09 DIAGNOSIS — O23593 Infection of other part of genital tract in pregnancy, third trimester: Secondary | ICD-10-CM | POA: Insufficient documentation

## 2023-03-09 DIAGNOSIS — O9882 Other maternal infectious and parasitic diseases complicating childbirth: Secondary | ICD-10-CM | POA: Diagnosis present

## 2023-03-09 DIAGNOSIS — O24420 Gestational diabetes mellitus in childbirth, diet controlled: Secondary | ICD-10-CM | POA: Diagnosis present

## 2023-03-09 DIAGNOSIS — O42919 Preterm premature rupture of membranes, unspecified as to length of time between rupture and onset of labor, unspecified trimester: Secondary | ICD-10-CM | POA: Diagnosis present

## 2023-03-09 DIAGNOSIS — O99824 Streptococcus B carrier state complicating childbirth: Secondary | ICD-10-CM | POA: Diagnosis present

## 2023-03-09 LAB — RUPTURE OF MEMBRANE (ROM)PLUS
Rom Plus: NEGATIVE
Rom Plus: POSITIVE

## 2023-03-09 LAB — WET PREP, GENITAL
Clue Cells Wet Prep HPF POC: NONE SEEN
Sperm: NONE SEEN
Trich, Wet Prep: NONE SEEN
WBC, Wet Prep HPF POC: >=10 — AB (ref <10)

## 2023-03-09 LAB — URINALYSIS, ROUTINE W REFLEX MICROSCOPIC
Bilirubin Urine: NEGATIVE
Glucose, UA: NEGATIVE mg/dL
Hgb urine dipstick: NEGATIVE
Ketones, ur: NEGATIVE mg/dL
Leukocytes,Ua: NEGATIVE
Nitrite: NEGATIVE
Protein, ur: NEGATIVE mg/dL
Specific Gravity, Urine: 1.021 (ref 1.005–1.030)
pH: 7 (ref 5.0–8.0)

## 2023-03-09 LAB — CBC
HCT: 39.9 % (ref 36.0–46.0)
Hemoglobin: 12.9 g/dL (ref 12.0–15.0)
MCH: 26.2 pg (ref 26.0–34.0)
MCHC: 32.3 g/dL (ref 30.0–36.0)
MCV: 81.1 fL (ref 80.0–100.0)
Platelets: 270 10*3/uL (ref 150–400)
RBC: 4.92 MIL/uL (ref 3.87–5.11)
RDW: 13 % (ref 11.5–15.5)
WBC: 12.4 10*3/uL — ABNORMAL HIGH (ref 4.0–10.5)
nRBC: 0 % (ref 0.0–0.2)

## 2023-03-09 LAB — GROUP B STREP BY PCR: Group B strep by PCR: POSITIVE — AB

## 2023-03-09 LAB — GLUCOSE, CAPILLARY: Glucose-Capillary: 79 mg/dL (ref 70–99)

## 2023-03-09 LAB — TYPE AND SCREEN
ABO/RH(D): O POS
Antibody Screen: NEGATIVE

## 2023-03-09 LAB — POCT FERN TEST: POCT Fern Test: POSITIVE

## 2023-03-09 MED ORDER — SODIUM CHLORIDE 0.9% FLUSH: 3.0000 mL | INTRAVENOUS | Status: DC | PRN

## 2023-03-09 MED ORDER — SODIUM CHLORIDE 0.9 % IV SOLN: 250.0000 mL | INTRAVENOUS | Status: DC | PRN

## 2023-03-09 MED ORDER — ACETAMINOPHEN 325 MG PO TABS: 650.0000 mg | ORAL_TABLET | ORAL | Status: DC | PRN

## 2023-03-09 MED ORDER — OXYCODONE-ACETAMINOPHEN 5-325 MG PO TABS: 1.0000 | ORAL_TABLET | ORAL | Status: DC | PRN

## 2023-03-09 MED ORDER — SODIUM CHLORIDE 0.9% FLUSH: 3.0000 mL | Freq: Two times a day (BID) | INTRAVENOUS | Status: DC

## 2023-03-09 MED ORDER — OXYTOCIN-SODIUM CHLORIDE 30-0.9 UT/500ML-% IV SOLN
2.5000 [IU]/h | INTRAVENOUS | Status: DC
Start: 2023-03-09 — End: 2023-03-10

## 2023-03-09 MED ORDER — SOD CITRATE-CITRIC ACID 500-334 MG/5ML PO SOLN: 30.0000 mL | ORAL | Status: DC | PRN

## 2023-03-09 MED ORDER — TERCONAZOLE 0.4 % VA CREA: 1.0000 | TOPICAL_CREAM | Freq: Every day | VAGINAL | 0 refills | Status: AC

## 2023-03-09 MED ORDER — ONDANSETRON HCL 4 MG/2ML IJ SOLN
4.0000 mg | Freq: Four times a day (QID) | INTRAMUSCULAR | Status: DC | PRN
  Administered 2023-03-10: 4 mg via INTRAVENOUS
  Filled 2023-03-09: qty 2

## 2023-03-09 MED ORDER — FENTANYL CITRATE (PF) 100 MCG/2ML IJ SOLN
50.0000 ug | INTRAMUSCULAR | Status: DC | PRN
  Administered 2023-03-10: 50 ug via INTRAVENOUS
  Administered 2023-03-10: 100 ug via INTRAVENOUS
  Filled 2023-03-09 (×2): qty 2

## 2023-03-09 MED ORDER — OXYTOCIN BOLUS FROM INFUSION
333.0000 mL | Freq: Once | INTRAVENOUS | Status: AC
  Administered 2023-03-10: 333 mL via INTRAVENOUS

## 2023-03-09 MED ORDER — OXYCODONE-ACETAMINOPHEN 5-325 MG PO TABS: 2.0000 | ORAL_TABLET | ORAL | Status: DC | PRN

## 2023-03-09 MED ORDER — LACTATED RINGERS IV SOLN
500.0000 mL | INTRAVENOUS | Status: DC | PRN
  Administered 2023-03-10: 1000 mL via INTRAVENOUS
  Administered 2023-03-10: 500 mL via INTRAVENOUS

## 2023-03-09 MED ORDER — LIDOCAINE HCL (PF) 1 % IJ SOLN: 30.0000 mL | INTRAMUSCULAR | Status: DC | PRN

## 2023-03-09 NOTE — MAU Note (Signed)

## 2023-03-09 NOTE — MAU Note (Signed)
.  Terri Combs is a 27 y.o. at [redacted]w[redacted]d here in MAU reporting: Vaginal spotting that began yesterday morning around 0500. She reports after the initial episode of bleeding at 0500, it stopped. She reports it returned yesterday evening. She reports today she is experiencing a brown liquid leaking out of her vagina. Reports there is an "interesting" odor. Denies vaginal itching.  Hx PP pre-eclampsia.  Onset of complaint: 0500 yesterday Pain score: Denies pain  Vitals:   03/09/23 1345  BP: 129/85  Pulse: 91  Resp: 16  Temp: 98 F (36.7 C)  SpO2: 99%      FHT: 150 initial external Lab orders placed from triage: UA, Crist Fat

## 2023-03-09 NOTE — MAU Note (Signed)
.  Terri Combs is a 27 y.o. at [redacted]w[redacted]d here in MAU reporting: Gush of clear fluid at 2000-states she was here in MAU earlier today and had a negative ROM +.   Contractions every: intermittent pressure in lower abdomen  Pain score: lower abdominal cramping 3 on 0-10 pain scale  ROM: Rule out SROM-gush at 2000 Vaginal Bleeding: None Last SVE: None Labor Pain Management Plan: Planning epidural and Undecided  Fetal Movement: Reports positive FM FHT: 140bpm   There were no vitals filed for this visit.   OB Office: [facility] GBS: Unknown Lab orders placed from triage: MAU Labor Eval

## 2023-03-09 NOTE — MAU Provider Note (Signed)
History     CSN: [id number]  Arrival date and time: 03/09/23 1313   Event Date/Time   First Provider Initiated Contact with Patient 03/09/23 1502      Chief Complaint  Patient presents with   Vaginal Discharge   HPI Terri Combs is a 27 y.o. G2P1001 at [redacted]w[redacted]d who presents for vaginal discharge.  Noticed a brown thin discharge in the last few days.  Earlier today had a watery component to it.  Not leaking clear fluid.  Denies vaginal bleeding.  Denies contractions.  Reports good fetal movement.  Next OB appointment is on Monday.  OB History     Gravida  2   Para  1   Term  1   Preterm  0   AB  0   Living  1      SAB  0   IAB  0   Ectopic  0   Multiple  0   Live Births  1           Past Medical History:  Diagnosis Date   Gestational diabetes    Migraine with aura    PUPP (pruritic urticarial papules and plaques of pregnancy)    Scoliosis     Past Surgical History:  Procedure Laterality Date   NO PAST SURGERIES      Family History  Problem Relation Age of Onset   Migraines Mother    Diabetes Mother    Arthritis Mother    Hypotension Father     Social History   Tobacco Use   Smoking status: Never   Smokeless tobacco: Never  Vaping Use   Vaping status: Never Used  Substance Use Topics   Alcohol use: No   Drug use: No    Allergies: No Known Allergies  No medications prior to admission.    Review of Systems  All other systems reviewed and are negative.  Physical Exam   Blood pressure 116/69, pulse 86, temperature 98.7 F (37.1 C), temperature source Oral, resp. rate 18, last menstrual period 07/06/2022, SpO2 98%, unknown if currently breastfeeding.  Physical Exam Vitals and nursing note reviewed. Exam conducted with a chaperone present.  Constitutional:      General: She is not in acute distress.    Appearance: She is well-developed. She is not ill-appearing.  HENT:     Head: Normocephalic and atraumatic.  Eyes:     General: No  scleral icterus.       Right eye: No discharge.        Left eye: No discharge.     Conjunctiva/sclera: Conjunctivae normal.  Pulmonary:     Effort: Pulmonary effort is normal. No respiratory distress.  Genitourinary:    Comments: SSE: NEFG. No pooling of fluid. Small amount of tan mucoid discharge. Cervix visually closed.  Neurological:     General: No focal deficit present.     Mental Status: She is alert.  Psychiatric:        Mood and Affect: Mood normal.        Behavior: Behavior normal.    NST:  Baseline: 145 bpm, Variability: Good {> 6 bpm), Accelerations: Reactive, and Decelerations: Absent  Pt informed that the ultrasound is considered a limited OB ultrasound and is not intended to be a complete ultrasound exam.  Patient also informed that the ultrasound is not being completed with the intent of assessing for fetal or placental anomalies or any pelvic abnormalities.  Explained that the purpose of today's ultrasound is to  assess for  presentation and AFI.  Patient acknowledges the purpose of the exam and the limitations of the study.   Cephalic. AFI 8.5. MVP 3.7    MAU Course  Procedures Results for orders placed or performed during the hospital encounter of 03/09/23 (from the past 24 hour(s))  Urinalysis, Routine w reflex microscopic -Urine, Clean Catch     Status: Abnormal   Collection Time: 03/09/23  2:23 PM  Result Value Ref Range   Color, Urine YELLOW YELLOW   APPearance HAZY (A) CLEAR   Specific Gravity, Urine 1.021 1.005 - 1.030   pH 7.0 5.0 - 8.0   Glucose, UA NEGATIVE NEGATIVE mg/dL   Hgb urine dipstick NEGATIVE NEGATIVE   Bilirubin Urine NEGATIVE NEGATIVE   Ketones, ur NEGATIVE NEGATIVE mg/dL   Protein, ur NEGATIVE NEGATIVE mg/dL   Nitrite NEGATIVE NEGATIVE   Leukocytes,Ua NEGATIVE NEGATIVE  Wet prep, genital     Status: Abnormal   Collection Time: 03/09/23  4:06 PM   Specimen: Cervix  Result Value Ref Range   Yeast Wet Prep HPF POC PRESENT (A) NONE SEEN    Trich, Wet Prep NONE SEEN NONE SEEN   Clue Cells Wet Prep HPF POC NONE SEEN NONE SEEN   WBC, Wet Prep HPF POC >=10 (A) <10   Sperm NONE SEEN   Fern Test     Status: None   Collection Time: 03/09/23  4:33 PM  Result Value Ref Range   POCT Fern Test Positive = ruptured amniotic membanes   Rupture of Membrane (ROM) Plus     Status: None   Collection Time: 03/09/23  4:45 PM  Result Value Ref Range   Rom Plus NEGATIVE     MDM Reviewed records scanned under media Labs ordered Pelvic exam Bedside ultrasound Prescribed medication   Assessment and Plan   1. Vaginal yeast infection   2. Encounter for suspected PROM, with rupture of membranes not found   3. [redacted] weeks gestation of pregnancy    -Sterile speculum exam performed, no pooling of fluid or bleeding.  Small amount of scant mucoid discharge.  Wet prep positive for yeast.  Prescribed terconazole -Fern slide had a few smalls ferns. Rom plus negative & exam negative for pooling. Informal BSUS with normal fluid level. Reviewed with Dr. Berton Lan - ok for discharge home with good return precautions -Patient has f/u on Monday.   Judeth Horn 03/09/2023, 7:15 PM

## 2023-03-10 ENCOUNTER — Encounter (HOSPITAL_COMMUNITY): Payer: Self-pay | Admitting: Obstetrics and Gynecology

## 2023-03-10 ENCOUNTER — Inpatient Hospital Stay (HOSPITAL_COMMUNITY): Payer: Medicaid Other | Admitting: Anesthesiology

## 2023-03-10 DIAGNOSIS — O42919 Preterm premature rupture of membranes, unspecified as to length of time between rupture and onset of labor, unspecified trimester: Secondary | ICD-10-CM | POA: Diagnosis present

## 2023-03-10 LAB — GLUCOSE, CAPILLARY
Glucose-Capillary: 127 mg/dL — ABNORMAL HIGH (ref 70–99)
Glucose-Capillary: 85 mg/dL (ref 70–99)
Glucose-Capillary: 83 mg/dL (ref 70–99)
Glucose-Capillary: 90 mg/dL (ref 70–99)

## 2023-03-10 LAB — RPR: RPR Ser Ql: NONREACTIVE

## 2023-03-10 MED ORDER — TETANUS-DIPHTH-ACELL PERTUSSIS 5-2.5-18.5 LF-MCG/0.5 IM SUSY: 0.5000 mL | PREFILLED_SYRINGE | Freq: Once | INTRAMUSCULAR | Status: DC

## 2023-03-10 MED ORDER — SODIUM CHLORIDE 0.9 % IV SOLN
5.0000 10*6.[IU] | Freq: Once | INTRAVENOUS | Status: AC
  Administered 2023-03-10: 5 10*6.[IU] via INTRAVENOUS
  Filled 2023-03-10: qty 5

## 2023-03-10 MED ORDER — PHENYLEPHRINE 80 MCG/ML (10ML) SYRINGE FOR IV PUSH (FOR BLOOD PRESSURE SUPPORT): 80.0000 ug | PREFILLED_SYRINGE | INTRAVENOUS | Status: DC | PRN

## 2023-03-10 MED ORDER — SIMETHICONE 80 MG PO CHEW: 80.0000 mg | CHEWABLE_TABLET | ORAL | Status: DC | PRN

## 2023-03-10 MED ORDER — BUPIVACAINE HCL (PF) 0.25 % IJ SOLN: INTRAMUSCULAR | Status: DC | PRN

## 2023-03-10 MED ORDER — LACTATED RINGERS IV SOLN: INTRAVENOUS | Status: DC

## 2023-03-10 MED ORDER — OXYTOCIN-SODIUM CHLORIDE 30-0.9 UT/500ML-% IV SOLN
1.0000 m[IU]/min | INTRAVENOUS | Status: DC
  Administered 2023-03-10: 2 m[IU]/min via INTRAVENOUS
  Filled 2023-03-10: qty 500

## 2023-03-10 MED ORDER — TRANEXAMIC ACID-NACL 1000-0.7 MG/100ML-% IV SOLN
1000.0000 mg | Freq: Once | INTRAVENOUS | Status: AC | PRN
  Administered 2023-03-10: 1000 mg via INTRAVENOUS
  Filled 2023-03-10: qty 100

## 2023-03-10 MED ORDER — EPHEDRINE 5 MG/ML INJ: 10.0000 mg | INTRAVENOUS | Status: DC | PRN

## 2023-03-10 MED ORDER — FENTANYL-BUPIVACAINE-NACL 0.5-0.125-0.9 MG/250ML-% EP SOLN
12.0000 mL/h | EPIDURAL | Status: DC | PRN
  Filled 2023-03-10: qty 250

## 2023-03-10 MED ORDER — BENZOCAINE-MENTHOL 20-0.5 % EX AERO: 1.0000 | INHALATION_SPRAY | CUTANEOUS | Status: DC | PRN

## 2023-03-10 MED ORDER — IBUPROFEN 600 MG PO TABS
600.0000 mg | ORAL_TABLET | Freq: Four times a day (QID) | ORAL | Status: DC
  Administered 2023-03-10 – 2023-03-11 (×2): 600 mg via ORAL
  Filled 2023-03-10 (×4): qty 1

## 2023-03-10 MED ORDER — ZOLPIDEM TARTRATE 5 MG PO TABS: 5.0000 mg | ORAL_TABLET | Freq: Every evening | ORAL | Status: DC | PRN

## 2023-03-10 MED ORDER — ACETAMINOPHEN 325 MG PO TABS
650.0000 mg | ORAL_TABLET | ORAL | Status: DC | PRN
  Filled 2023-03-10: qty 2

## 2023-03-10 MED ORDER — BUPIVACAINE HCL (PF) 0.25 % IJ SOLN
INTRAMUSCULAR | Status: DC | PRN
  Administered 2023-03-10: 1.2 mL via INTRATHECAL

## 2023-03-10 MED ORDER — PRENATAL MULTIVITAMIN CH: 1.0000 | ORAL_TABLET | Freq: Every day | ORAL | Status: DC

## 2023-03-10 MED ORDER — LACTATED RINGERS IV SOLN
500.0000 mL | Freq: Once | INTRAVENOUS | Status: AC
  Administered 2023-03-10: 500 mL via INTRAVENOUS

## 2023-03-10 MED ORDER — SENNOSIDES-DOCUSATE SODIUM 8.6-50 MG PO TABS
2.0000 | ORAL_TABLET | Freq: Every day | ORAL | Status: DC
  Administered 2023-03-12: 2 via ORAL
  Filled 2023-03-10: qty 2

## 2023-03-10 MED ORDER — DIBUCAINE (PERIANAL) 1 % EX OINT: 1.0000 | TOPICAL_OINTMENT | CUTANEOUS | Status: DC | PRN

## 2023-03-10 MED ORDER — PHENYLEPHRINE 80 MCG/ML (10ML) SYRINGE FOR IV PUSH (FOR BLOOD PRESSURE SUPPORT)
80.0000 ug | PREFILLED_SYRINGE | INTRAVENOUS | Status: DC | PRN
  Filled 2023-03-10: qty 10

## 2023-03-10 MED ORDER — DIPHENHYDRAMINE HCL 50 MG/ML IJ SOLN: 12.5000 mg | INTRAMUSCULAR | Status: DC | PRN

## 2023-03-10 MED ORDER — ONDANSETRON HCL 4 MG/2ML IJ SOLN: 4.0000 mg | INTRAMUSCULAR | Status: DC | PRN

## 2023-03-10 MED ORDER — TERBUTALINE SULFATE 1 MG/ML IJ SOLN: 0.2500 mg | Freq: Once | INTRAMUSCULAR | Status: DC | PRN

## 2023-03-10 MED ORDER — PENICILLIN G POT IN DEXTROSE 60000 UNIT/ML IV SOLN
3.0000 10*6.[IU] | INTRAVENOUS | Status: DC
  Administered 2023-03-10 (×2): 3 10*6.[IU] via INTRAVENOUS
  Filled 2023-03-10 (×2): qty 50

## 2023-03-10 MED ORDER — FENTANYL-BUPIVACAINE-NACL 0.5-0.125-0.9 MG/250ML-% EP SOLN
EPIDURAL | Status: DC | PRN
  Administered 2023-03-10: 12 mL/h via EPIDURAL

## 2023-03-10 MED ORDER — DIPHENHYDRAMINE HCL 25 MG PO CAPS: 25.0000 mg | ORAL_CAPSULE | Freq: Four times a day (QID) | ORAL | Status: DC | PRN

## 2023-03-10 MED ORDER — WITCH HAZEL-GLYCERIN EX PADS: 1.0000 | MEDICATED_PAD | CUTANEOUS | Status: DC | PRN

## 2023-03-10 MED ORDER — FLUCONAZOLE 150 MG PO TABS
150.0000 mg | ORAL_TABLET | Freq: Once | ORAL | Status: AC
  Administered 2023-03-10: 150 mg via ORAL
  Filled 2023-03-10: qty 1

## 2023-03-10 MED ORDER — ONDANSETRON HCL 4 MG PO TABS: 4.0000 mg | ORAL_TABLET | ORAL | Status: DC | PRN

## 2023-03-10 MED ORDER — COCONUT OIL OIL: 1.0000 | TOPICAL_OIL | Status: DC | PRN

## 2023-03-10 NOTE — Progress Notes (Signed)
   Labor Progress Note  Terri Combs is a 27 y.o. female, G2P1001, IUP at 36.1 weeks, presenting for PPROM with cxt @ 2000 on 11/23, then preterm labor 4 hours later, with cxt, GDMA1, placenta previa that has resolved, alpha thalassemia, h/o preE in previous preg. Marland Kitchen Pt endorse + Fm. GBS+. LR Female  Subjective: Pt still tolerating cxt well, pace breathing through, did received one dose fentanyl, but continue to move toward active labor on pitocin.  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 No intake/output data recorded. NST: FHR baseline 140 bpm, Variability: moderate, Accelerations:present, Decelerations:  Absent= Cat 1/Reactive CTX:  irregular, every 1-8 minutes Uterus gravid, soft non tender, moderate to palpate with contractions.  SVE:  Dilation: 5 Effacement (%): 80 Station: -1, 0 Exam by:: [employer], CNM Pitocin at 79mUn/min  Assessment:  Terri Combs is a 27 y.o. female, G2P1001, IUP at 36.1 weeks, presenting for PPROM with cxt @ 2000 on 11/23, then preterm labor 4 hours later, with cxt, GDMA1, placenta previa that has resolved, alpha thalassemia, h/o preE in previous preg. Marland Kitchen Pt endorse + Fm. GBS+. LR Female. Progressing in latent labor.  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   Foley Bulb: N/A PPROM  Pitocin - 6  Pain  management:               IV pain management: x IV fentanyl PRN 11/24 @ 1001  Nitrous: PRN             Epidural placement: PRN  GBS Positive  Abx: PCN 11/24 @ 0355, 0805  A1GDM: stable sugars  CBG (last 3)  Recent Labs    03/09/23 2318 03/10/23 0400 03/10/23 0811  GLUCAP 79 127* 85    Plan: Continue labor plan Continuous monitoring Rest Ambulate Frequent position changes to facilitate fetal rotation and descent. Will reassess with cervical exam at 4 hour or earlier if necessary Continue pitocin per protocol 2x2 CBG Q4H for GDMA1 PCN Q4H for GBS+. Anticipate labor progression and vaginal delivery.   [facility] CNM, FNP-C, PMHNP-BC  [address]  [place], [place] [address]  Cell: [phone]  Office Phone: [phone] Fax: [phone] 03/10/2023  4:45 AM

## 2023-03-10 NOTE — Progress Notes (Signed)
   Labor Progress Note  Terri Combs is a 27 y.o. female, G2P1001, IUP at 36.1 weeks, presenting for PPROM with cxt @ 2000 on 11/23, then preterm labor 4 hours later, with cxt, GDMA1, placenta previa that has resolved, alpha thalassemia, h/o preE in previous preg. Marland Kitchen Pt endorse + Fm. GBS+. LR Female  Subjective: Pt in bed attempting natural delivery, stable, and tolerating contractions. Progress in labor on pitocin now.  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 No intake/output data recorded. NST: FHR baseline 135 bpm, Variability: moderate, Accelerations:present, Decelerations:  Absent= Cat 1/Reactive CTX:  irregular, every 2-6 minutes Uterus gravid, soft non tender, moderate to palpate with contractions.  SVE:  Dilation: 3 Effacement (%): 60 Station: -2 Exam by:: Scientist, product/process development Pitocin at 43mUn/min  Assessment:  Terri Combs is a 27 y.o. female, G2P1001, IUP at 36.1 weeks, presenting for PPROM with cxt @ 2000 on 11/23, then preterm labor 4 hours later, with cxt, GDMA1, placenta previa that has resolved, alpha thalassemia, h/o preE in previous preg. Marland Kitchen Pt endorse + Fm. GBS+. LR Female. Progressing in latent labor.  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   Foley Bulb: N/A PPROM  Pitocin - 2  Pain management:               IV pain  management: x IV fentanyl PRN  Nitrous: PRN             Epidural placement: PRN  GBS Positive  Abx: PCN 11/24 @ 0355  A1GDM: stable sugars  CBG (last 3)  Recent Labs    03/09/23 2318 03/10/23 0400  GLUCAP 79 127*    Plan: Continue labor plan Continuous monitoring Rest Ambulate Frequent position changes to facilitate fetal rotation and descent. Will reassess with cervical exam at 4 hour or earlier if necessary Continue pitocin per protocol 2x2 CBG Q4H for GDMA1 PCN Q4H for GBS+. Anticipate labor progression and vaginal delivery.   [facility] CNM, FNP-C, PMHNP-BC  [address]  [place], [place] [address]  Cell: [phone]  Office Phone: [phone] Fax: [phone] 03/10/2023  4:45 AM

## 2023-03-10 NOTE — Anesthesia Preprocedure Evaluation (Addendum)
Anesthesia Evaluation  Patient identified by MRN, date of birth, ID band Patient awake    Reviewed: Allergy & Precautions, Patient's Chart, lab work & pertinent test results  Airway Mallampati: II  TM Distance: >3 FB Neck ROM: Full    Dental no notable dental hx.    Pulmonary neg pulmonary ROS   Pulmonary exam normal breath sounds clear to auscultation       Cardiovascular negative cardio ROS Normal cardiovascular exam Rhythm:Regular Rate:Normal     Neuro/Psych  Headaches  negative psych ROS   GI/Hepatic negative GI ROS, Neg liver ROS,,,  Endo/Other  diabetes, Well Controlled, Gestational    Renal/GU negative Renal ROS  negative genitourinary   Musculoskeletal negative musculoskeletal ROS (+)    Abdominal   Peds negative pediatric ROS (+)  Hematology negative hematology ROS (+) Hb 12.9, plt 270   Anesthesia Other Findings   Reproductive/Obstetrics (+) Pregnancy                             Anesthesia Physical Anesthesia Plan  ASA: 2  Anesthesia Plan: Combined Spinal and Epidural   Post-op Pain Management:    Induction:   PONV Risk Score and Plan: 2  Airway Management Planned: Natural Airway  Additional Equipment: None  Intra-op Plan:   Post-operative Plan:   Informed Consent: I have reviewed the patients History and Physical, chart, labs and discussed the procedure including the risks, benefits and alternatives for the proposed anesthesia with the patient or authorized representative who has indicated his/her understanding and acceptance.       Plan Discussed with:   Anesthesia Plan Comments: (Advanced cervical dilation, CSE)       Anesthesia Quick Evaluation

## 2023-03-10 NOTE — Lactation Note (Signed)
This note was copied from a baby's chart. Lactation Consultation Note  Patient Name: Girl Lucylle Burkette [id number] Date: 03/10/2023 Age:27 hours Reason for consult: Initial assessment;Late-preterm 34-36.6wks.  P2,LPTI. MOB latch infant on her left breast using the cross cradle hold, infant latched with depth, without difficulty and breastfeed for 15 minutes, afterwards infant was given 15 mls of 22 kcal formula using [place] nipple. FOB was feeding infant with 22 kcal supplement while MOB using DEBP fitted with 18 mm breast flange. MOB had expressed 8 mls and was still pumping when LC left the room. LC reviewed LPTI feeding guidelines and the "MY Plan" crib card, family knows to supplement infant after each feeding Day 1 ( 10-12 mls) or more if infant wants it. LC discussed the importance of maternal rest, balance diet and hydration. LC discussed infant's input and output. MOB was made aware of O/P services, breastfeeding support groups, community resources, and our phone # for post-discharge questions.    MOB knows that her EBM is safe for 4 hours whereas formula must be used within 1 hour once open.  Today's current feeding plan:  1- MOB will continue to BF infant every 3 hours and limit breast feeding to 15 minutes or less and supplement infant with each feeding. MOB will limit total feedings for 30 minutes or less. 2- MOB plans to offer her EBM that she pumped at the next feeding, after latching infant at the breast before supplementing with 22 kcal formula. 3- MOB will continue to use DEBP every 3 hours for 15 minutes on initial setting.   Maternal Data Has patient been taught Hand Expression?: Yes Does the patient have breastfeeding experience prior to this delivery?: Yes How long did the patient breastfeed?: Per MOB, had latch difficulties with 1st child so pumped for 5 months  Feeding Mother's Current Feeding Choice: Breast Milk and Formula  LATCH Score Latch: Grasps breast easily, tongue  down, lips flanged, rhythmical sucking.  Audible Swallowing: Spontaneous and intermittent  Type of Nipple: Everted at rest and after stimulation  Comfort (Breast/Nipple): Soft / non-tender  Hold (Positioning): Assistance needed to correctly position infant at breast and maintain latch.  LATCH Score: 9   Lactation Tools Discussed/Used Tools: Pump Breast pump type: Double-Electric Breast Pump Pump Education: Setup, frequency, and cleaning;Milk Storage Reason for Pumping: Infant is LPTI, less than 5 lbs and being supplemented with formula. Pumping frequency: MOB will continue to use DEBP every 3 hours for 15 minutes on inital setting. Pumped volume: 7 mL (MOB was still expressing colosttrum when LC left the room.)  Interventions    Discharge Pump: DEBP;Personal  Consult Status Consult Status: Follow-up Date: 03/11/23 Follow-up type: In-patient    SHIREE DUNFORD 03/10/2023, 4:49 PM

## 2023-03-10 NOTE — Anesthesia Procedure Notes (Addendum)
Epidural Patient location during procedure: OB Start time: 03/10/2023 12:46 PM End time: 03/10/2023 12:58 PM  Staffing Anesthesiologist: Lannie Fields, DO Performed: anesthesiologist   Preanesthetic Checklist Completed: patient identified, IV checked, risks and benefits discussed, monitors and equipment checked, pre-op evaluation and timeout performed  Epidural Patient position: sitting Prep: DuraPrep and site prepped and draped Patient monitoring: continuous pulse ox, blood pressure, heart rate and cardiac monitor Approach: midline Location: L3-L4 Injection technique: LOR air  Needle:  Needle type: Tuohy  Needle gauge: 17 G Needle length: 9 cm Needle insertion depth: 6 cm Catheter type: closed end flexible Catheter size: 19 Gauge Catheter at skin depth: 11 cm Test dose: negative  Assessment Sensory level: T8 Events: blood not aspirated, no cerebrospinal fluid, injection not painful, no injection resistance, no paresthesia and negative IV test  Additional Notes Patient identified. Risks/Benefits/Options discussed with patient including but not limited to bleeding, infection, nerve damage, paralysis, failed block, incomplete pain control, headache, blood pressure changes, nausea, vomiting, reactions to medication both or allergic, itching and postpartum back pain. Confirmed with bedside nurse the patient's most recent platelet count. Confirmed with patient that they are not currently taking any anticoagulation, have any bleeding history or any family history of bleeding disorders. Patient expressed understanding and wished to proceed. All questions were answered. Sterile technique was used throughout the entire procedure. Please see nursing notes for vital signs. Test dose was given through epidural catheter and negative prior to continuing to dose epidural or start infusion. Warning signs of high block given to the patient including shortness of breath, tingling/numbness in  hands, complete motor block, or any concerning symptoms with instructions to call for help. Patient was given instructions on fall risk and not to get out of bed. All questions and concerns addressed with instructions to call with any issues or inadequate analgesia.    CSE performed w/ 24G pencan through tuohy, clear CSF, no issues.Reason for block:procedure for pain

## 2023-03-10 NOTE — H&P (Signed)
Terri Combs is a 27 y.o. female, G2P1001, IUP at 36.1 weeks, presenting for PPROM with cxt @ 2000 on 11/23, then preterm labor 4 hours later, with cxt, GDMA1, placenta previa that has resolved, alpha thalassemia, h/o preE in previous preg. Marland Kitchen Pt endorse + Fm. Denies vaginal bleeding.    Patient Active Problem List   Diagnosis Date Noted   Preterm labor 03/09/2023   Migraine 06/08/2019   History of gestational diabetes 05/02/2019   Hemoglobin [place] trait 02/13/2019   Scoliosis     Active Ambulatory Problems    Diagnosis Date Noted   Scoliosis    Hemoglobin [place] trait 02/13/2019   History of gestational diabetes 05/02/2019   Migraine 06/08/2019   Resolved Ambulatory Problems    Diagnosis Date Noted   Alzheimer's dementia without behavioral disturbance (HCC) 01/07/2017   Pregnant 12/08/2018   Pregnancy at early stage 12/16/2018   Supervision of high risk pregnancy, antepartum 01/01/2019   GBS (group B streptococcus) UTI complicating pregnancy 01/12/2019   Abnormal glucose tolerance test (GTT) during pregnancy, antepartum 05/13/2019   Encounter for induction of labor 07/15/2019   Vaginal delivery 07/17/2019   Past Medical History:  Diagnosis Date   Gestational diabetes    Migraine with aura    PUPP (pruritic urticarial papules and plaques of pregnancy)       Medications Prior to Admission  Medication Sig Dispense Refill Last Dose   aspirin 81 MG chewable tablet Chew 81 mg by mouth daily.   Past Month   ferrous sulfate 325 (65 FE) MG tablet Take 325 mg by mouth daily with breakfast.   Past Week   Prenatal Vit-Fe Fumarate-FA (MULTIVITAMIN-PRENATAL) 27-0.8 MG TABS tablet Take 1 tablet by mouth daily at 12 noon.   03/09/2023   terconazole (TERAZOL 7) 0.4 % vaginal cream Place 1 applicator vaginally at bedtime. Use for seven days 45 g 0     Past Medical History:  Diagnosis Date   Gestational diabetes    Migraine with aura    PUPP (pruritic urticarial  papules and plaques of pregnancy)    Scoliosis      No current facility-administered medications on file prior to encounter.   Current Outpatient Medications on File Prior to Encounter  Medication Sig Dispense Refill   aspirin 81 MG chewable tablet Chew 81 mg by mouth daily.     ferrous sulfate 325 (65 FE) MG tablet Take 325 mg by mouth daily with breakfast.     Prenatal Vit-Fe Fumarate-FA (MULTIVITAMIN-PRENATAL) 27-0.8 MG TABS tablet Take 1 tablet by mouth daily at 12 noon.     terconazole (TERAZOL 7) 0.4 % vaginal cream Place 1 applicator vaginally at bedtime. Use for seven days 45 g 0     No Known Allergies  History of present pregnancy: Pt Info/Preference:  Screening/Consents:  Labs:   EDD: Estimated Date of Delivery: 04/05/23  Establised: Patient's last menstrual period was 07/06/2022.  Anatomy Scan: Date: unable to find Placenta Location: ??? Genetic Screen: [place]oroma:LR AFP:  First Tri: Quad:  Office: [facility]            Md: DR Richardson Dopp First PNV: ??? Blood Type --/--/O POS (11/23 2234)  Language: english Last PNV: ??? Rhogam    Flu Vaccine:  ???   Antibody NEG (11/23 2234)  TDaP vaccine ???   GTT: Early: Passed Third Trimester: Failed 150  Feeding Plan: ??? BTL: no Rubella:  Immune  Contraception: ??? VBAC: no RPR:   NR  Circumcision:  Female n/A   HBsAg:  Neg  Pediatrician:  ???   HIV:   Neg  Prenatal Classes: no Additional [place]: ??? GBS: POSITIVE/-- (11/23 2223)(For PCN allergy, check sensitivities)       Chlamydia: neg    MFM Referral/Consult:  GC: neg  Support Person: parnter   PAP: ???  Pain Management: epidural Neonatologist Referral:  Hgb Electrophoresis:  Alpha thal  Birth Plan: DCC   Hgb NOB: 13.4    28W: ???   OB History     Gravida  2   Para  1   Term  1   Preterm  0   AB  0   Living  1      SAB  0   IAB  0   Ectopic  0   Multiple  0   Live Births  1          Past Medical History:  Diagnosis Date   Gestational diabetes    Migraine with  aura    PUPP (pruritic urticarial papules and plaques of pregnancy)    Scoliosis    Past Surgical History:  Procedure Laterality Date   NO PAST SURGERIES     Family History: family history includes Arthritis in her mother; Diabetes in her mother; Hypotension in her father; Migraines in her mother. Social History:  reports that she has never smoked. She has never used smokeless tobacco. She reports that she does not drink alcohol and does not use drugs.   Prenatal Transfer Tool  Maternal Diabetes: Yes:  Diabetes Type:  Diet controlled Genetic Screening: Normal Maternal Ultrasounds/Referrals: Normal Fetal Ultrasounds or other Referrals:  None Maternal Substance Abuse:  No Significant Maternal Medications:  None Significant Maternal Lab Results: Group B Strep positive  ROS:  Review of Systems  Constitutional: Negative.   HENT: Negative.    Eyes: Negative.   Respiratory: Negative.    Cardiovascular: Negative.   Gastrointestinal:  Positive for abdominal pain.  Genitourinary: Negative.        Leakage of fluid   Musculoskeletal: Negative.   Skin: Negative.   Neurological: Negative.   Endo/Heme/Allergies: Negative.   Psychiatric/Behavioral: Negative.       Physical Exam: BP 108/70   Pulse 78   Temp 98.5 F (36.9 C) (Oral)   Resp 17   Ht 5\' 2"  (1.575 m)   Wt 76.7 kg   LMP 07/06/2022   SpO2 98%   BMI 30.91 kg/m   Physical Exam Vitals and nursing note reviewed.  Constitutional:      Appearance: Normal appearance.  HENT:     Head: Normocephalic and atraumatic.     Nose: Nose normal.     Mouth/Throat:     Mouth: Mucous membranes are moist.  Eyes:     Pupils: Pupils are equal, round, and reactive to light.  Cardiovascular:     Rate and Rhythm: Normal rate and regular rhythm.     Pulses: Normal pulses.     Heart sounds: Normal heart sounds.  Pulmonary:     Effort: Pulmonary effort is normal.     Breath sounds: Normal breath sounds.  Abdominal:     General: Bowel  sounds are normal.  Musculoskeletal:        General: Normal range of motion.     Cervical back: Normal range of motion and neck supple.  Skin:    General: Skin is warm.     Capillary Refill: Capillary refill takes less than 2 seconds.  Neurological:  General: No focal deficit present.     Mental Status: She is alert.  Psychiatric:        Mood and Affect: Mood normal.      NST: FHR baseline 150 bpm, Variability: moderate, Accelerations:present, Decelerations:  Absent= Cat 1/Reactive UC:   irregular, every 2-4 minutes SVE:   Dilation: 3 Effacement (%): 60 Station: -2 Exam by:: [employer]	[place], vertex verified by fetal sutures.  Leopold's: Position ???, EFW 6lbs via leopold's.  VXT verified by bedside US in MAU  Labs: Results for orders placed or performed during the hospital encounter of 03/09/23 (from the past 24 hour(s))  Rupture of Membrane (ROM) Plus     Status: None   Collection Time: 03/09/23  9:37 PM  Result Value Ref Range   Rom Plus POSITIVE   Group B strep by PCR     Status: Abnormal   Collection Time: 03/09/23 10:23 PM   Specimen: Vaginal/Rectal; Genital  Result Value Ref Range   Group B strep by PCR POSITIVE (A) PRESUMPTIVE NEGATIVE  CBC     Status: Abnormal   Collection Time: 03/09/23 10:34 PM  Result Value Ref Range   WBC 12.4 (H) 4.0 - 10.5 K/uL   RBC 4.92 3.87 - 5.11 MIL/uL   Hemoglobin 12.9 12.0 - 15.0 g/dL   HCT 44.0 10.2 - 72.5 %   MCV 81.1 80.0 - 100.0 fL   MCH 26.2 26.0 - 34.0 pg   MCHC 32.3 30.0 - 36.0 g/dL   RDW 36.6 44.0 - 34.7 %   Platelets 270 150 - 400 K/uL   nRBC 0.0 0.0 - 0.2 %  Type and screen [facility]     Status: None   Collection Time: 03/09/23 10:34 PM  Result Value Ref Range   ABO/RH(D) O POS    Antibody Screen NEG    Sample Expiration      03/12/2023,2359 Performed at [facility], [address] [place], [place] [address]   Glucose, capillary     Status: None   Collection Time: 03/09/23 11:18 PM   Result Value Ref Range   Glucose-Capillary 79 70 - 99 mg/dL  Glucose, capillary     Status: Abnormal   Collection Time: 03/10/23  4:00 AM  Result Value Ref Range   Glucose-Capillary 127 (H) 70 - 99 mg/dL    Imaging:  No results found.  MAU Course: Orders Placed This Encounter  Procedures   Group B strep by PCR   Rupture of Membrane (ROM) Plus   CBC   RPR   Glucose, capillary   Glucose, capillary   Diet clear liquid Room service appropriate? Yes; Fluid consistency: Thin   Contraction - monitoring   External fetal heart monitoring   Vaginal exam   Vitals signs per unit policy   Notify physician (specify)   Fetal monitoring per unit policy   Activity as tolerated   Cervical Exam   Measure blood pressure post delivery every 15 min x 1 hour then every 30 min x 1 hour   Fundal check post delivery every 15 min x 1 hour then every 30 min x 1 hour   Apply Labor & Delivery Care Plan   If Rapid HIV test positive or known HIV positive: initiate AZT orders   May in and out cath x 2 for inability to void   Insert urethral catheter X 1 PRN If Coude Catheter is chosen, qualified resources by campus can be found in the clinical skills  nursing procedure for Coude Catheter 1. If straight catheterized > 2 times or patient unable to void post epidural plac...   Refer to Sidebar Report Urinary (Foley) Catheter Indications   Refer to Sidebar Report Post Indwelling Urinary Catheter Removal and Intervention Guidelines   Discontinue foley prior to vaginal delivery   Initiate Oral Care Protocol   Initiate Carrier Fluid Protocol   Patient may have epidural placement upon request   Evaluate fetal heart rate to establish reassuring pattern prior to initiating Cytotec or Pitocin   Perform a cervical exam prior to initiating Cytotec or Pitocin   Discontinue Pitocin if tachysystole with non-reassuring FHR is present   Notify physician (specify) Tachysystole is defined as more than 5 contractions in a  10-minute time period averaged over a 30-minute window   Initiate intrauterine resuscitation if tachysystole with non-reassuring FHR is present   Notify physician (specify) Tachysystole is defined as more than 5 contractions in a 10-minute time period averaged over a 30-minute window   May administer Terbutaline 0.25 mg SQ x 1 dose if tachysystole with non-reassuring FHR is present   Labor Induction   Full code   Nitrous Oxide 50%/Oxygen 50%   Type and screen [facility]   Insert and maintain IV Line   Admit to Inpatient (patient's expected length of stay will be greater than 2 midnights or inpatient only procedure)   Meds ordered this encounter  Medications   oxytocin (PITOCIN) IV BOLUS FROM BAG   oxytocin (PITOCIN) IV infusion 30 units in NS 500 mL - Premix   lactated ringers infusion 500-1,000 mL   acetaminophen (TYLENOL) tablet 650 mg   oxyCODONE-acetaminophen (PERCOCET/ROXICET) 5-325 MG per tablet 1 tablet   oxyCODONE-acetaminophen (PERCOCET/ROXICET) 5-325 MG per tablet 2 tablet   ondansetron (ZOFRAN) injection 4 mg   sodium citrate-citric acid (ORACIT) solution 30 mL   lidocaine (PF) (XYLOCAINE) 1 % injection 30 mL   sodium chloride flush (NS) 0.9 % injection 3 mL   sodium chloride flush (NS) 0.9 % injection 3 mL   0.9 %  sodium chloride infusion   fentaNYL (SUBLIMAZE) injection 50-100 mcg   terbutaline (BRETHINE) injection 0.25 mg   oxytocin (PITOCIN) IV infusion 30 units in NS 500 mL - Premix    Order Specific Question:   Begin infusion at:    Answer:   2 milli-units/min (2 mL/hr)    Order Specific Question:   Increase infusion by:    Answer:   2 milli-units/min (2 mL/hr)   FOLLOWED BY Linked Order Group    penicillin G potassium 5 Million Units in sodium chloride 0.9 % 250 mL IVPB     Order Specific Question:   Antibiotic Indication:     Answer:   Group B Strep Prophylaxis    penicillin G potassium 3 Million Units in dextrose 50mL IVPB     Order Specific  Question:   Antibiotic Indication:     Answer:   Group B Strep Prophylaxis    Assessment/Plan: Terri Combs is a 26 y.o. female, G2P1001, IUP at 36.1 weeks, presenting for PPROM with cxt @ 2000 on 11/23, then preterm labor 4 hours later, with cxt, GDMA1, placenta previa that has resolved, alpha thalassemia, h/o preE in previous preg. Marland Kitchen Pt endorse + Fm. Denies vaginal bleeding.   FWB: Cat 1 Fetal Tracing.   Plan: Admit to Birthing Suite per consult with DR Su Hilt Routine [facility] orders Pain med/epidural prn PCN G for GBS prophylaxis  GDMA1: Q4H CBG Expectant  management.  Anticipate labor progression    [facility] CNM, FNP-C, PMHNP-BC  [address]  [place], [place] [address]  Cell: [phone]  Office Phone: [phone] Fax: [phone] 03/09/2023  11:56 PM

## 2023-03-11 LAB — CBC
HCT: 32.6 % — ABNORMAL LOW (ref 36.0–46.0)
Hemoglobin: 10.4 g/dL — ABNORMAL LOW (ref 12.0–15.0)
MCH: 26.1 pg (ref 26.0–34.0)
MCHC: 31.9 g/dL (ref 30.0–36.0)
MCV: 81.7 fL (ref 80.0–100.0)
Platelets: 251 10*3/uL (ref 150–400)
RBC: 3.99 MIL/uL (ref 3.87–5.11)
RDW: 13.2 % (ref 11.5–15.5)
WBC: 15.9 10*3/uL — ABNORMAL HIGH (ref 4.0–10.5)
nRBC: 0 % (ref 0.0–0.2)

## 2023-03-11 LAB — GC/CHLAMYDIA PROBE AMP (~~LOC~~) NOT AT ARMC
Chlamydia: NEGATIVE
Comment: NEGATIVE
Comment: NORMAL
Neisseria Gonorrhea: NEGATIVE

## 2023-03-11 NOTE — Progress Notes (Signed)
Was unable to get to patient's room until 0630. Was unable to obtain pt's blood sugar at this time because she was eating a sandwich tray.

## 2023-03-11 NOTE — Progress Notes (Signed)
Post Partum Day 1 Subjective: no complaints, up ad lib, voiding, and tolerating PO  Objective: Blood pressure 105/69, pulse 70, temperature 98.2 F (36.8 C), temperature source Oral, resp. rate 18, height 5\' 2"  (1.575 m), weight 76.7 kg, last menstrual period 07/06/2022, SpO2 100%, unknown if currently breastfeeding.  Physical Exam:  General: alert and no distress Lochia: appropriate Uterine Fundus: firm Incision: NA DVT Evaluation: No evidence of DVT seen on physical exam.  Recent Labs    03/09/23 2234 03/11/23 0403  HGB 12.9 10.4*  HCT 39.9 32.6*    Assessment/Plan: Plan for discharge tomorrow, Breastfeeding, and Lactation consult Routine postpartum care    LOS: 2 days   Gerald Leitz, MD 03/11/2023, 5:27 PM

## 2023-03-11 NOTE — Lactation Note (Signed)
This note was copied from a baby's chart. Lactation Consultation Note  Patient Name: Terri Combs FUXNA'T Date: 03/11/2023 Age:27 hours Reason for consult: Follow-up assessment;Infant < 6lbs;Late-preterm 34-36.6wks, infant with -1.67% weight loss. Per MOB, infant is still latching well at the breast, infant recently BF for 8 minutes and supplemented with 20 mls of 22 kcal formula at 1700 pm. LC did not observe latch at this time. Per MOB, she has been using the DEBP almost every 3 hours today only twice she did not pump when FOB left room. MOB will continue to follow LPTI feeding guidelines and MOB knows to call for latch assistance if needed.   Today's current feeding plans:   1- MOB will continue to follow LPTI feeding guidelines limiting chest and bottle feeding to 30 minutes or less, feeding infant every 3 hours. 2- Day 2 infant will be supplement after each latch with 15-18 mls of EBM/22 kal formula or more if infant wants it. 3- MOB will continue to use DEBP every 3 hours for 15 minutes on initial setting and offer her EBM first before formula. Maternal Data    Feeding Mother's Current Feeding Choice: Breast Milk and Formula  LATCH Score                    Lactation Tools Discussed/Used    Interventions Interventions: DEBP;Education  Discharge    Consult Status Consult Status: Follow-up Date: 03/12/23 Follow-up type: In-patient    Terri Combs 03/11/2023, 7:02 PM

## 2023-03-11 NOTE — Anesthesia Postprocedure Evaluation (Signed)
Anesthesia Post Note  Patient: Terri Combs  Procedure(s) Performed: AN AD HOC LABOR EPIDURAL     Patient location during evaluation: Mother Baby Anesthesia Type: Epidural Level of consciousness: awake and alert Pain management: pain level controlled Vital Signs Assessment: post-procedure vital signs reviewed and stable Respiratory status: spontaneous breathing, nonlabored ventilation and respiratory function stable Cardiovascular status: stable Postop Assessment: no headache, no backache and epidural receding Anesthetic complications: no   No notable events documented.  Last Vitals:  Vitals:   03/11/23 0239 03/11/23 0636  BP: 104/69 116/78  Pulse: 80 95  Resp: 17 18  Temp: 36.6 C 36.7 C  SpO2: 98% 100%    Last Pain:  Vitals:   03/11/23 0636  TempSrc: Oral  PainSc: 0-No pain   Pain Goal:                   [employer]

## 2023-03-12 LAB — SURGICAL PATHOLOGY

## 2023-03-12 LAB — GLUCOSE, CAPILLARY: Glucose-Capillary: 81 mg/dL (ref 70–99)

## 2023-03-12 MED ORDER — ACETAMINOPHEN 325 MG PO TABS: 650.0000 mg | ORAL_TABLET | Freq: Four times a day (QID) | ORAL | Status: AC | PRN

## 2023-03-12 MED ORDER — IBUPROFEN 600 MG PO TABS: 600.0000 mg | ORAL_TABLET | Freq: Four times a day (QID) | ORAL | 0 refills | Status: AC | PRN

## 2023-03-12 NOTE — Lactation Note (Signed)
This note was copied from a baby's chart. Lactation Consultation Note  Patient Name: Terri Combs Date: 03/12/2023 Age:27 hours, P2 ,   Reason for consult: Follow-up assessment;Late-preterm 34-36.6wks;Infant < 6lbs;Infant weight loss;Nipple pain/trauma (4 % weight loss) As LC entered the room the baby latched on the left breast cross cradle with depth. Swallows noted when mom did the breast compressions.  Mom mentioned nipple soreness with initial latch and LC instructed her on the steps for latching.  Breast massage, hand express, pre-pump if needed and reverse pressure.  LC reviewed BF D/C teaching for a LPT and baby less than 6 pounds.  Feed with feeding cues and by 3 hours. If the baby is to sluggish to latch, feed the supplement 1st and then the breast. Post pump both Breast for 15 mins and save the milk.  Mom aware of the storage of breast milk.  LC offer to request and LC O/P and mom receptive.   Maternal Data Has patient been taught Hand Expression?: Yes  Feeding Mother's Current Feeding Choice: Breast Milk and Formula Nipple Type: Nfant Standard Flow (white)  LATCH Score Latch:  (latched in the left breast with depth)  Audible Swallowing:  (swallows increased with breast compressions)     Comfort (Breast/Nipple):  (pe rmom comfortable now, but initially sensitive to start)  Hold (Positioning):  (mom latched the baby prior to [facility] entering the room)      Lactation Tools Discussed/Used Tools: Pump Breast pump type: Double-Electric Breast Pump;Manual Pump Education: Milk Storage Pumping frequency: per mom milk is slow to come in and just pumpung off a few ml's  Interventions Interventions: Breast feeding basics reviewed;Hand pump;DEBP;Education;Pace feeding;LC Services brochure;LPT handout/interventions;CDC Guidelines for Breast Pump Cleaning  Discharge Discharge Education: Engorgement and breast care;Warning signs for feeding baby;Outpatient  recommendation;Outpatient Epic message sent;Other (comment) (mom receptive and aware she will receive a call from the [facility] Pump: Personal;DEBP;Manual WIC Program: No  Consult Status Consult Status: Complete Date: 03/12/23    Maigen Suthar 03/12/2023, 10:06 AM

## 2023-03-17 NOTE — Discharge Summary (Signed)
Postpartum Discharge Summary  Date of Service updated 03/12/2023     Patient Name: Terri Combs DOB: 23-Dec-1995 MRN: [id number]  Date of admission: 03/09/2023 Delivery date:03/10/2023 Delivering provider: Dale [place] Date of discharge: 03/17/2023  Admitting diagnosis: Preterm labor [O60.00] Intrauterine pregnancy: [redacted]w[redacted]d     Secondary diagnosis:  Principal Problem:   Preterm labor Active Problems:   SVD (spontaneous vaginal delivery)   Normal postpartum course   Preterm premature rupture of membranes (PPROM) delivered, current hospitalization  Additional problems: Gestational diabetes A1DM    Discharge diagnosis: Term Pregnancy Delivered and GDM A1                                              Post partum procedures: None Augmentation: Pitocin Complications: None  Hospital course: Onset of Labor With Vaginal Delivery      27 y.o. yo [id number] at [redacted]w[redacted]d was admitted in Active Labor on 03/09/2023. Labor course was uncomplicated   Membrane Rupture Time/Date: 8:00 PM,03/09/2023  Delivery Method:Vaginal, Spontaneous Operative Delivery:N/A Episiotomy: None Lacerations:  None Patient had a postpartum course that was uncomplicated.  She is ambulating, tolerating a regular diet, passing flatus, and urinating well. Patient is discharged home in stable condition on 03/17/23.  Newborn Data: Birth date:03/10/2023 Birth time:1:47 PM Gender:Female Living status:Living Apgars:9 ,9  Weight:2390 g  Magnesium Sulfate received: No BMZ received: No Rhophylac:N/A MMR:N/A T-DaP:Given prenatally Flu: Yes RSV Vaccine received: No Transfusion:No Immunizations administered: Immunization History  Administered Date(s) Administered   Influenza,inj,Quad PF,6+ Mos 04/29/2019   Tdap 06/29/2019    Physical exam  Vitals:   03/11/23 0636 03/11/23 1425 03/11/23 2100 03/12/23 0511  BP: 116/78 105/69 111/72 112/79  Pulse: 95 70 78 75  Resp: 18 18 18 16   Temp: 98.1 F (36.7 C) 98.2 F (36.8 C)  97.9 F (36.6 C) 98.2 F (36.8 C)  TempSrc: Oral Oral Oral Oral  SpO2: 100% 100% 99% 99%  Weight:      Height:       General: alert, cooperative, and no distress Lochia: appropriate Uterine Fundus: firm Incision: N/A DVT Evaluation: No evidence of DVT seen on physical exam. Labs: Lab Results  Component Value Date   WBC 15.9 (H) 03/11/2023   HGB 10.4 (L) 03/11/2023   HCT 32.6 (L) 03/11/2023   MCV 81.7 03/11/2023   PLT 251 03/11/2023      Latest Ref Rng & Units 09/20/2022   11:56 PM  CMP  Glucose 70 - 99 mg/dL 91   BUN 6 - 20 mg/dL 8   Creatinine 3.24 - 4.01 mg/dL 0.27   Sodium 253 - 664 mmol/L 134   Potassium 3.5 - 5.1 mmol/L 3.6   Chloride 98 - 111 mmol/L 102   CO2 22 - 32 mmol/L 19   Calcium 8.9 - 10.3 mg/dL 8.8   Total Protein 6.5 - 8.1 g/dL 6.9   Total Bilirubin 0.3 - 1.2 mg/dL 0.9   Alkaline Phos 38 - 126 U/L 38   AST 15 - 41 U/L 19   ALT 0 - 44 U/L 14    Edinburgh Score:    08/20/2019   11:17 AM  Edinburgh Postnatal Depression Scale Screening Tool  I have been able to laugh and see the funny side of things. 0  I have looked forward with enjoyment to things. 0  I have blamed myself unnecessarily when  things went wrong. 2  I have been anxious or worried for no good reason. 0  I have felt scared or panicky for no good reason. 0  Things have been getting on top of me. 0  I have been so unhappy that I have had difficulty sleeping. 0  I have felt sad or miserable. 1  I have been so unhappy that I have been crying. 1  The thought of harming myself has occurred to me. 0  Edinburgh Postnatal Depression Scale Total 4      After visit meds:  Allergies as of 03/12/2023   No Known Allergies      Medication List     STOP taking these medications    aspirin 81 MG chewable tablet       TAKE these medications    acetaminophen 325 MG tablet Commonly known as: Tylenol Take 2 tablets (650 mg total) by mouth every 6 (six) hours as needed (for pain scale <  4).   ferrous sulfate 325 (65 FE) MG tablet Take 325 mg by mouth daily with breakfast.   ibuprofen 600 MG tablet Commonly known as: ADVIL Take 1 tablet (600 mg total) by mouth every 6 (six) hours as needed for mild pain (pain score 1-3).   multivitamin-prenatal 27-0.8 MG Tabs tablet Take 1 tablet by mouth daily at 12 noon.   terconazole 0.4 % vaginal cream Commonly known as: TERAZOL 7 Place 1 applicator vaginally at bedtime. Use for seven days         Discharge home in stable condition Infant Feeding: Bottle and Breast Infant Disposition:home with mother Discharge instruction: per After Visit Summary and Postpartum booklet. Activity: Advance as tolerated. Pelvic rest for 6 weeks.  Diet: routine diet Anticipated Birth Control: Unsure Postpartum Appointment:6 weeks Additional Postpartum F/U:  None Future Appointments: Future Appointments  Date Time Provider Department Center  05/14/2023  1:15 PM Butch Penny, NP [facility] None   Follow up Visit:      03/17/2023 Gerald Leitz, MD

## 2023-03-21 ENCOUNTER — Telehealth (HOSPITAL_COMMUNITY): Payer: Self-pay | Admitting: *Deleted

## 2023-03-21 NOTE — Telephone Encounter (Signed)
03/21/2023  Name: Lexxus Arroyave MRN: [id number] DOB: 1995/05/03  Reason for Call:  Transition of Care Hospital Discharge Call  Contact Status: Patient Contact Status: Message  Language assistant needed: Interpreter Mode: Interpreter Not Needed        Follow-Up Questions:    Inocente Salles Postnatal Depression Scale:  In the Past 7 Days:    PHQ2-9 Depression Scale:     Discharge Follow-up:    Post-discharge interventions: NA  Salena Saner, RN 03/21/2023 13:15

## 2023-05-14 ENCOUNTER — Telehealth: Payer: Medicaid Other | Admitting: Adult Health

## 2023-05-14 DIAGNOSIS — G43109 Migraine with aura, not intractable, without status migrainosus: Secondary | ICD-10-CM | POA: Diagnosis not present

## 2023-05-14 NOTE — Progress Notes (Signed)
PATIENT: Terri Combs DOB: 1996-01-15  REASON FOR VISIT: follow up HISTORY FROM: patient  Virtual Visit via Video Note  I connected with Terri Combs on 05/14/23 at  1:15 PM EST by a video enabled telemedicine application located remotely at [facility] and verified that I am speaking with the correct person using two identifiers who was located at their own home.   I discussed the limitations of evaluation and management by telemedicine and the availability of in person appointments. The patient expressed understanding and agreed to proceed.   PATIENT: Terri Combs DOB: 1995/11/05  REASON FOR VISIT: follow up HISTORY FROM: patient  HISTORY OF PRESENT ILLNESS: Today 05/14/23:  Natlie Combs is a 28 y.o. female with a history of migraines with aura. Returns today for follow-up.  She had her baby in November.  She states that since then she is only had approximately 3 migraines.  She states typically she can take over-the-counter medicine or lay in a dark room and the headache will resolve fairly quickly for her.  At this time she does not feel that she needs prescription medication.  She plans to breast-feed for approximately 6 months.  She returns today for follow-up.     HISTORY (copied from Dr. Trevor Mace note) HPI : Here for migraines. We saw her in 2019 for similar and she no-showed follow up appointment. has Scoliosis; Hemoglobin Constant Spring trait; History of gestational diabetes; and Migraine on their problem list.  I reviewed [name] notes, she also has a history of vitamin D deficiency, fatigue, irregular periods.  [name]'s examination both physical and neurological were normal.  States she has not history of migraine with aura, previously given sumatriptan as needed, when she has migraine she goes into her dark room and rest and it resolves.  Her last migraine was 3 months ago in November 2023, prior to that she was having slightly more frequent migraines, she wanted  to come back and be seen again.  It appears as though on June 08, 2022 they check CBC, CMP, lipid panel, vitamin D, hemoglobin A1c, thyroid, FSH and LH, estradiol, testosterone, prolactin but I do not have those results.   She still sees auras, zig zag lines, prisms, then she has the headache and blurry vision, this has been ongoing for years nothing new. Followed by the head pain, pulsating/pounding/throbbing, started in teens, mom has migraines, on both sidesof the head, associated dizziness, photophobia/phonophobia, a dark room and sleep helps, gets nausea, vomited once, hurts to move, sleeping helps, no food triggers, sleep quality can trigger or make it worse, can last all day, sumatriptan used to make it stop within an hour. Last month had 4 migraine days and usually 4-5 migraine days a month sometimes no episodes.    Reviewed notes, labs and imaging from outside physicians, which showed:   From a thorough review of records, medications tried that can be used in headache management for more than 3 months includes: Imitrex, almotriptan, Tylenol, Cambia, Benadryl, ibuprofen, Toradol injections, Reglan tablets, Zofran, oxycodone, Phenergan, Maxalt,  REVIEW OF SYSTEMS: Out of a complete 14 system review of symptoms, the patient complains only of the following symptoms, and all other reviewed systems are negative.  ALLERGIES: No Known Allergies  HOME MEDICATIONS: Outpatient Medications Prior to Visit  Medication Sig Dispense Refill   acetaminophen (TYLENOL) 325 MG tablet Take 2 tablets (650 mg total) by mouth every 6 (six) hours as needed (for pain scale < 4).  ferrous sulfate 325 (65 FE) MG tablet Take 325 mg by mouth daily with breakfast.     ibuprofen (ADVIL) 600 MG tablet Take 1 tablet (600 mg total) by mouth every 6 (six) hours as needed for mild pain (pain score 1-3). 30 tablet 0   Prenatal Vit-Fe Fumarate-FA (MULTIVITAMIN-PRENATAL) 27-0.8 MG TABS tablet Take 1 tablet by mouth daily  at 12 noon.     terconazole (TERAZOL 7) 0.4 % vaginal cream Place 1 applicator vaginally at bedtime. Use for seven days 45 g 0   No facility-administered medications prior to visit.    PAST MEDICAL HISTORY: Past Medical History:  Diagnosis Date   Gestational diabetes    Migraine with aura    PUPP (pruritic urticarial papules and plaques of pregnancy)    Scoliosis     PAST SURGICAL HISTORY: Past Surgical History:  Procedure Laterality Date   NO PAST SURGERIES      FAMILY HISTORY: Family History  Problem Relation Age of Onset   Migraines Mother    Diabetes Mother    Arthritis Mother    Hypotension Father     SOCIAL HISTORY: Social History   Socioeconomic History   Marital status: Single    Spouse name: Not on file   Number of children: 0   Years of education: 12   Highest education level: Not on file  Occupational History   Occupation: Dispensing optician  Tobacco Use   Smoking status: Never   Smokeless tobacco: Never  Vaping Use   Vaping status: Never Used  Substance and Sexual Activity   Alcohol use: No   Drug use: No   Sexual activity: Yes    Partners: Male    Birth control/protection: None  Other Topics Concern   Not on file  Social History Narrative   Lives at home w/ her parents   Right-handed   Caffeine: soda daily   Social Drivers of Health   Financial Resource Strain: Low Risk  (08/07/2021)   Received from [facility], [facility]   Overall Financial Resource Strain (CARDIA)    Difficulty of Paying Living Expenses: Not very hard  Food Insecurity: No Food Insecurity (03/09/2023)   Hunger Vital Sign    Worried About Running Out of Food in the Last Year: Never true    Ran Out of Food in the Last Year: Never true  Transportation Needs: No Transportation Needs (03/09/2023)   PRAPARE - Administrator, Civil Service (Medical): No    Lack of Transportation (Non-Medical): No  Physical Activity: Insufficiently Active (08/07/2021)   Received from  [facility], [facility]   Exercise Vital Sign    Days of Exercise per Week: 4 days    Minutes of Exercise per Session: 20 min  Stress: No Stress Concern Present (08/07/2021)   Received from [facility] - Occupational Stress Questionnaire    Feeling of Stress : Only a little  Social Connections: Unknown (08/24/2022)   Received from [facility], [facility]   Social Network    Social Network: Not on file  Intimate Partner Violence: Not At Risk (03/09/2023)   Humiliation, Afraid, Rape, and Kick questionnaire    Fear of Current or Ex-Partner: No    Emotionally Abused: No    Physically Abused: No    Sexually Abused: No      PHYSICAL EXAM Generalized: Well developed, in no acute distress   Neurological examination  Mentation: Alert oriented to time,  place, history taking. Follows all commands speech and language fluent Cranial nerve II-XII:Extraocular movements were full. Facial symmetry noted. uvula tongue midline. Head turning and shoulder shrug  were normal and symmetric. Motor: Good strength throughout subjectively per patient Sensory: Sensory testing is intact to soft touch on all 4 extremities subjectively per patient Coordination: Cerebellar testing reveals good finger-nose-finger  Gait and station: Patient is able to stand from a seated position. gait is normal.  Reflexes: UTA  DIAGNOSTIC DATA (LABS, IMAGING, TESTING) - I reviewed patient records, labs, notes, testing and imaging myself where available.  Lab Results  Component Value Date   WBC 15.9 (H) 03/11/2023   HGB 10.4 (L) 03/11/2023   HCT 32.6 (L) 03/11/2023   MCV 81.7 03/11/2023   PLT 251 03/11/2023      Component Value Date/Time   NA 134 (L) 09/20/2022 2356   NA 136 12/08/2018 1052   K 3.6 09/20/2022 2356   CL 102 09/20/2022 2356   CO2 19 (L) 09/20/2022 2356   GLUCOSE 91 09/20/2022 2356   BUN 8 09/20/2022 2356   BUN 7 12/08/2018 1052    CREATININE 0.69 09/20/2022 2356   CALCIUM 8.8 (L) 09/20/2022 2356   PROT 6.9 09/20/2022 2356   PROT 7.0 12/08/2018 1052   ALBUMIN 3.4 (L) 09/20/2022 2356   ALBUMIN 4.2 12/08/2018 1052   AST 19 09/20/2022 2356   ALT 14 09/20/2022 2356   ALKPHOS 38 09/20/2022 2356   BILITOT 0.9 09/20/2022 2356   BILITOT 0.4 12/08/2018 1052   GFRNONAA >60 09/20/2022 2356   GFRAA >60 07/15/2019 0550   Lab Results  Component Value Date   CHOL 139 05/05/2016   HDL 59 05/05/2016   LDLCALC 68 05/05/2016   TRIG 60 05/05/2016   CHOLHDL 2.4 05/05/2016   Lab Results  Component Value Date   HGBA1C 5.3 12/08/2018   No results found for: "VITAMINB12" Lab Results  Component Value Date   TSH 0.707 04/24/2018      ASSESSMENT AND PLAN 28 y.o. year old female  has a past medical history of Gestational diabetes, Migraine with aura, PUPP (pruritic urticarial papules and plaques of pregnancy), and Scoliosis. here with:  Migraine with aura Patient reports that since she had her baby she is only had 3 migraines.  Since migraines are relatively stable at this time we will continue to monitor.  Certainly if her headaches pick up in frequency we can consider adding on medication.  She will follow-up in 8  months or sooner if needed    Butch Penny, MSN, NP-C 05/14/2023, 1:14 PM [facility] [address] [place], [place] [address] [phone]

## 2024-01-14 ENCOUNTER — Telehealth (INDEPENDENT_AMBULATORY_CARE_PROVIDER_SITE_OTHER): Payer: Medicaid Other | Admitting: Adult Health

## 2024-01-14 DIAGNOSIS — G43109 Migraine with aura, not intractable, without status migrainosus: Secondary | ICD-10-CM | POA: Diagnosis not present

## 2024-01-14 NOTE — Progress Notes (Signed)
 PATIENT: Terri Combs DOB: 09-19-1995  REASON FOR VISIT: follow up HISTORY FROM: patient  Virtual Visit via Video Note  I connected with Terri Combs on 01/14/24 at  2:00 PM EDT by a video enabled telemedicine application located remotely at [facility] and verified that I am speaking with the correct person using two identifiers who was located at their own home.   I discussed the limitations of evaluation and management by telemedicine and the availability of in person appointments. The patient expressed understanding and agreed to proceed.   PATIENT: Terri Combs DOB: [birth year]  REASON FOR VISIT: follow up HISTORY FROM: patient  HISTORY OF PRESENT ILLNESS: Today 01/14/24:  Terri Combs is a 28 y.o. female with a history of migraine headaches with aura. Returns today for follow-up.  She states that overall her headaches have been under good control.  She is only having a migraine every 2 months.  She states that she usually can sleep it off.  She is not interested in trying any new medications at this time.  She states if her headaches worsen she will certainly let us  know.   05/14/23: Terri Combs is a 28 y.o. female with a history of migraines with aura. Returns today for follow-up.  She had her baby in November.  She states that since then she is only had approximately 3 migraines.  She states typically she can take over-the-counter medicine or lay in a dark room and the headache will resolve fairly quickly for her.  At this time she does not feel that she needs prescription medication.  She plans to breast-feed for approximately 6 months.  She returns today for follow-up.  HISTORY (copied from Dr. Sharion note) HPI : Here for migraines. We saw her in 2019 for similar and she no-showed follow up appointment. has Scoliosis; Hemoglobin Constant Spring trait; History of gestational diabetes; and Migraine on their problem list.  I reviewed Terri Combs notes, she also has a history of  vitamin D deficiency, fatigue, irregular periods.  Terri Combs's examination both physical and neurological were normal.  States she has not history of migraine with aura, previously given sumatriptan  as needed, when she has migraine she goes into her dark room and rest and it resolves.  Her last migraine was 3 months ago in November 2023, prior to that she was having slightly more frequent migraines, she wanted to come back and be seen again.  It appears as though on June 08, 2022 they check CBC, CMP, lipid panel, vitamin D, hemoglobin A1c, thyroid, FSH and LH, estradiol , testosterone, prolactin but I do not have those results.   She still sees auras, zig zag lines, prisms, then she has the headache and blurry vision, this has been ongoing for years nothing new. Followed by the head pain, pulsating/pounding/throbbing, started in teens, mom has migraines, on both sidesof the head, associated dizziness, photophobia/phonophobia, a dark room and sleep helps, gets nausea, vomited once, hurts to move, sleeping helps, no food triggers, sleep quality can trigger or make it worse, can last all day, sumatriptan  used to make it stop within an hour. Last month had 4 migraine days and usually 4-5 migraine days a month sometimes no episodes.    Reviewed notes, labs and imaging from outside physicians, which showed:   From a thorough review of records, medications tried that can be used in headache management for more than 3 months includes: Imitrex , almotriptan , Tylenol , Cambia , Benadryl , ibuprofen , Toradol  injections, Reglan  tablets, Zofran ,  oxycodone , Phenergan , Maxalt ,  REVIEW OF SYSTEMS: Out of a complete 14 system review of symptoms, the patient complains only of the following symptoms, and all other reviewed systems are negative.  ALLERGIES: No Known Allergies  HOME MEDICATIONS: Outpatient Medications Prior to Visit  Medication Sig Dispense Refill   acetaminophen  (TYLENOL ) 325 MG tablet Take 2 tablets  (650 mg total) by mouth every 6 (six) hours as needed (for pain scale < 4).     ferrous sulfate  325 (65 FE) MG tablet Take 325 mg by mouth daily with breakfast.     ibuprofen  (ADVIL ) 600 MG tablet Take 1 tablet (600 mg total) by mouth every 6 (six) hours as needed for mild pain (pain score 1-3). 30 tablet 0   Prenatal Vit-Fe Fumarate-FA (MULTIVITAMIN-PRENATAL) 27-0.8 MG TABS tablet Take 1 tablet by mouth daily at 12 noon.     terconazole  (TERAZOL 7 ) 0.4 % vaginal cream Place 1 applicator vaginally at bedtime. Use for seven days 45 g 0   No facility-administered medications prior to visit.    PAST MEDICAL HISTORY: Past Medical History:  Diagnosis Date   Gestational diabetes    Migraine with aura    PUPP (pruritic urticarial papules and plaques of pregnancy)    Scoliosis     PAST SURGICAL HISTORY: Past Surgical History:  Procedure Laterality Date   NO PAST SURGERIES      FAMILY HISTORY: Family History  Problem Relation Age of Onset   Migraines Mother    Diabetes Mother    Arthritis Mother    Hypotension Father     SOCIAL HISTORY: Social History   Socioeconomic History   Marital status: Single    Spouse name: Not on file   Number of children: 0   Years of education: 12   Highest education level: Not on file  Occupational History   Occupation: Dispensing optician  Tobacco Use   Smoking status: Never   Smokeless tobacco: Never  Vaping Use   Vaping status: Never Used  Substance and Sexual Activity   Alcohol use: No   Drug use: No   Sexual activity: Yes    Partners: Male    Birth control/protection: None  Other Topics Concern   Not on file  Social History Narrative   Lives at home w/ her parents   Right-handed   Caffeine: soda daily   Social Drivers of Health   Financial Resource Strain: Low Risk  (08/07/2021)   Received from [facility]   Overall Financial Resource Strain (CARDIA)    Difficulty of Paying Living Expenses: Not very hard  Food Insecurity: No Food  Insecurity (03/09/2023)   Hunger Vital Sign    Worried About Running Out of Food in the Last Year: Never true    Ran Out of Food in the Last Year: Never true  Transportation Needs: No Transportation Needs (03/09/2023)   PRAPARE - Administrator, Civil Service (Medical): No    Lack of Transportation (Non-Medical): No  Physical Activity: Insufficiently Active (08/07/2021)   Received from [facility]   Exercise Vital Sign    On average, how many days per week do you engage in moderate to strenuous exercise (like a brisk walk)?: 4 days    On average, how many minutes do you engage in exercise at this level?: 20 min  Stress: No Stress Concern Present (08/07/2021)   Received from [facility] - Occupational Stress Questionnaire    Feeling of Stress :  Only a little  Social Connections: Unknown (08/24/2022)   Received from [facility]   Social Network    Social Network: Not on file  Intimate Partner Violence: Not At Risk (03/09/2023)   Humiliation, Afraid, Rape, and Kick questionnaire    Fear of Current or Ex-Partner: No    Emotionally Abused: No    Physically Abused: No    Sexually Abused: No      PHYSICAL EXAM Generalized: Well developed, in no acute distress   Neurological examination  Mentation: Alert oriented to time, place, history taking. Follows all commands speech and language fluent Cranial nerve II-XII: Facial symmetry noted DIAGNOSTIC DATA (LABS, IMAGING, TESTING) - I reviewed patient records, labs, notes, testing and imaging myself where available.  Lab Results  Component Value Date   WBC 15.9 (H) 03/11/2023   HGB 10.4 (L) 03/11/2023   HCT 32.6 (L) 03/11/2023   MCV 81.7 03/11/2023   PLT 251 03/11/2023      Component Value Date/Time   NA 134 (L) 09/20/2022 2356   NA 136 12/08/2018 1052   K 3.6 09/20/2022 2356   CL 102 09/20/2022 2356   CO2 19 (L) 09/20/2022 2356   GLUCOSE 91 09/20/2022 2356   BUN 8  09/20/2022 2356   BUN 7 12/08/2018 1052   CREATININE 0.69 09/20/2022 2356   CALCIUM 8.8 (L) 09/20/2022 2356   PROT 6.9 09/20/2022 2356   PROT 7.0 12/08/2018 1052   ALBUMIN 3.4 (L) 09/20/2022 2356   ALBUMIN 4.2 12/08/2018 1052   AST 19 09/20/2022 2356   ALT 14 09/20/2022 2356   ALKPHOS 38 09/20/2022 2356   BILITOT 0.9 09/20/2022 2356   BILITOT 0.4 12/08/2018 1052   GFRNONAA >60 09/20/2022 2356   GFRAA >60 07/15/2019 0550   Lab Results  Component Value Date   CHOL 139 05/05/2016   HDL 59 05/05/2016   LDLCALC 68 05/05/2016   TRIG 60 05/05/2016   CHOLHDL 2.4 05/05/2016   Lab Results  Component Value Date   HGBA1C 5.3 12/08/2018   No results found for: VITAMINB12 Lab Results  Component Value Date   TSH 0.707 04/24/2018      ASSESSMENT AND PLAN 28 y.o. year old female  has a past medical history of Gestational diabetes, Migraine with aura, PUPP (pruritic urticarial papules and plaques of pregnancy), and Scoliosis. here with:  Migraine with aura  Overall the patient's migraines are stable.  For now she will continue to monitor.  She was certainly advised that if her headaches increase in severity or duration we can consider adding on a preventative or abortive medication.  She verbalized understanding.  She returns today for an evaluation.    Duwaine Russell, MSN, NP-C 01/14/2024, 1:52 PM [facility] [address], [address] [place], [place] [address] [phone]  The patient's condition requires frequent monitoring and adjustments in the treatment plan, reflecting the ongoing complexity of care.  This provider is the continuing focal point for all needed services for this condition.

## 2024-02-02 ENCOUNTER — Other Ambulatory Visit: Payer: Self-pay | Admitting: Medical Genetics

## 2024-03-05 ENCOUNTER — Other Ambulatory Visit
